# Patient Record
Sex: Male | Born: 1955 | Race: Black or African American | Hispanic: No | Marital: Married | Smoking: Never smoker
Health system: Southern US, Community
[De-identification: ages and names within clinical notes are randomized; demographics above are authoritative.]

## PROBLEM LIST (undated history)

## (undated) DIAGNOSIS — E119 Type 2 diabetes mellitus without complications: Secondary | ICD-10-CM

## (undated) DIAGNOSIS — I10 Essential (primary) hypertension: Secondary | ICD-10-CM

## (undated) HISTORY — DX: Essential (primary) hypertension: I10

## (undated) HISTORY — DX: Type 2 diabetes mellitus without complications: E11.9

## (undated) HISTORY — PX: FRACTURE SURGERY: SHX138

---

## 1978-03-02 HISTORY — PX: OTHER SURGICAL HISTORY: SHX169

## 2002-03-12 ENCOUNTER — Emergency Department (HOSPITAL_COMMUNITY): Admission: EM | Admit: 2002-03-12 | Discharge: 2002-03-12 | Payer: Self-pay | Admitting: Emergency Medicine

## 2008-11-28 ENCOUNTER — Observation Stay (HOSPITAL_COMMUNITY): Admission: EM | Admit: 2008-11-28 | Discharge: 2008-11-29 | Payer: Self-pay | Admitting: Emergency Medicine

## 2008-11-28 ENCOUNTER — Ambulatory Visit: Payer: Self-pay | Admitting: Cardiology

## 2008-11-28 ENCOUNTER — Encounter (INDEPENDENT_AMBULATORY_CARE_PROVIDER_SITE_OTHER): Payer: Self-pay | Admitting: *Deleted

## 2008-12-10 ENCOUNTER — Ambulatory Visit: Payer: Self-pay | Admitting: Gastroenterology

## 2008-12-10 DIAGNOSIS — R1310 Dysphagia, unspecified: Secondary | ICD-10-CM | POA: Insufficient documentation

## 2008-12-10 DIAGNOSIS — K5909 Other constipation: Secondary | ICD-10-CM | POA: Insufficient documentation

## 2008-12-10 DIAGNOSIS — R634 Abnormal weight loss: Secondary | ICD-10-CM

## 2008-12-11 ENCOUNTER — Encounter: Payer: Self-pay | Admitting: Gastroenterology

## 2008-12-11 ENCOUNTER — Ambulatory Visit: Payer: Self-pay | Admitting: Gastroenterology

## 2008-12-12 ENCOUNTER — Telehealth: Payer: Self-pay | Admitting: Gastroenterology

## 2008-12-13 ENCOUNTER — Telehealth (INDEPENDENT_AMBULATORY_CARE_PROVIDER_SITE_OTHER): Payer: Self-pay | Admitting: *Deleted

## 2008-12-14 ENCOUNTER — Telehealth: Payer: Self-pay | Admitting: Gastroenterology

## 2008-12-14 ENCOUNTER — Encounter: Payer: Self-pay | Admitting: Gastroenterology

## 2008-12-17 ENCOUNTER — Ambulatory Visit: Payer: Self-pay | Admitting: Gastroenterology

## 2008-12-19 ENCOUNTER — Ambulatory Visit: Payer: Self-pay | Admitting: Gastroenterology

## 2008-12-19 ENCOUNTER — Telehealth (INDEPENDENT_AMBULATORY_CARE_PROVIDER_SITE_OTHER): Payer: Self-pay | Admitting: *Deleted

## 2008-12-20 DIAGNOSIS — R109 Unspecified abdominal pain: Secondary | ICD-10-CM

## 2008-12-20 DIAGNOSIS — F329 Major depressive disorder, single episode, unspecified: Secondary | ICD-10-CM

## 2008-12-21 ENCOUNTER — Telehealth: Payer: Self-pay | Admitting: Gastroenterology

## 2008-12-25 ENCOUNTER — Telehealth (INDEPENDENT_AMBULATORY_CARE_PROVIDER_SITE_OTHER): Payer: Self-pay | Admitting: *Deleted

## 2008-12-28 ENCOUNTER — Emergency Department (HOSPITAL_COMMUNITY): Admission: EM | Admit: 2008-12-28 | Discharge: 2008-12-29 | Payer: Self-pay | Admitting: Emergency Medicine

## 2010-03-23 ENCOUNTER — Encounter: Payer: Self-pay | Admitting: Gastroenterology

## 2010-06-05 LAB — CBC
Hemoglobin: 14.6 g/dL (ref 13.0–17.0)
RBC: 4.83 MIL/uL (ref 4.22–5.81)
WBC: 5 10*3/uL (ref 4.0–10.5)

## 2010-06-05 LAB — BASIC METABOLIC PANEL
Calcium: 9.1 mg/dL (ref 8.4–10.5)
GFR calc Af Amer: >60 mL/min (ref >60)
GFR calc non Af Amer: >60 mL/min (ref >60)
Sodium: 136 mEq/L (ref 135–145)

## 2010-06-05 LAB — RAPID URINE DRUG SCREEN, HOSP PERFORMED
Amphetamines: NOT DETECTED
Benzodiazepines: NOT DETECTED
Opiates: NOT DETECTED
Tetrahydrocannabinol: NOT DETECTED

## 2010-06-05 LAB — DIFFERENTIAL
Basophils Absolute: 0 10*3/uL (ref 0.0–0.1)
Lymphocytes Relative: 41 % (ref 12–46)
Monocytes Absolute: 0.4 10*3/uL (ref 0.1–1.0)
Monocytes Relative: 7 % (ref 3–12)
Neutro Abs: 2.5 10*3/uL (ref 1.7–7.7)

## 2010-06-06 LAB — CK TOTAL AND CKMB (NOT AT ARMC)
CK, MB: 1.8 ng/mL (ref 0.3–4.0)
Relative Index: 1 (ref 0.0–2.5)
Total CK: 177 U/L (ref 7–232)
Total CK: 177 U/L (ref 7–232)

## 2010-06-06 LAB — CBC
HCT: 44.2 % (ref 39.0–52.0)
Hemoglobin: 15.4 g/dL (ref 13.0–17.0)
MCHC: 34.9 g/dL (ref 30.0–36.0)
RDW: 12.6 % (ref 11.5–15.5)

## 2010-06-06 LAB — DIFFERENTIAL
Basophils Absolute: 0 10*3/uL (ref 0.0–0.1)
Basophils Relative: 1 % (ref 0–1)
Monocytes Relative: 7 % (ref 3–12)
Neutro Abs: 2.2 10*3/uL (ref 1.7–7.7)
Neutrophils Relative %: 45 % (ref 43–77)

## 2010-06-06 LAB — COMPREHENSIVE METABOLIC PANEL
Alkaline Phosphatase: 59 U/L (ref 39–117)
BUN: 6 mg/dL (ref 6–23)
GFR calc non Af Amer: >60 mL/min (ref >60)
Glucose, Bld: 95 mg/dL (ref 70–99)
Potassium: 4.2 mEq/L (ref 3.5–5.1)
Total Protein: 7.4 g/dL (ref 6.0–8.3)

## 2010-06-06 LAB — POCT CARDIAC MARKERS
CKMB, poc: 1.3 ng/mL (ref 1.0–8.0)
Myoglobin, poc: 73.7 ng/mL (ref 12–200)

## 2010-06-06 LAB — TROPONIN I: Troponin I: 0.01 ng/mL (ref 0.00–0.06)

## 2011-03-12 ENCOUNTER — Encounter (INDEPENDENT_AMBULATORY_CARE_PROVIDER_SITE_OTHER): Payer: BC Managed Care – PPO | Admitting: Family Medicine

## 2011-03-12 DIAGNOSIS — Z13220 Encounter for screening for lipoid disorders: Secondary | ICD-10-CM

## 2011-03-12 DIAGNOSIS — Z0000 Encounter for general adult medical examination without abnormal findings: Secondary | ICD-10-CM

## 2011-03-12 DIAGNOSIS — R0602 Shortness of breath: Secondary | ICD-10-CM

## 2011-03-12 DIAGNOSIS — R079 Chest pain, unspecified: Secondary | ICD-10-CM

## 2011-03-13 ENCOUNTER — Other Ambulatory Visit: Payer: Self-pay | Admitting: Family Medicine

## 2011-03-14 ENCOUNTER — Ambulatory Visit (INDEPENDENT_AMBULATORY_CARE_PROVIDER_SITE_OTHER): Payer: BC Managed Care – PPO

## 2011-03-14 DIAGNOSIS — E119 Type 2 diabetes mellitus without complications: Secondary | ICD-10-CM

## 2011-03-17 ENCOUNTER — Other Ambulatory Visit: Payer: Self-pay

## 2011-03-18 ENCOUNTER — Ambulatory Visit (INDEPENDENT_AMBULATORY_CARE_PROVIDER_SITE_OTHER): Payer: BC Managed Care – PPO

## 2011-03-18 DIAGNOSIS — E119 Type 2 diabetes mellitus without complications: Secondary | ICD-10-CM

## 2011-05-09 IMAGING — CR DG ABDOMEN 1V
1 series · 1 of 1 positions shown · non-contrast
Comparison: Chest radiograph from the same day.

CLINICAL DATA: 52-year-old male with chest pain and no bowel
movement for 1 month.

ABDOMEN - 1 VIEW

[t abdomen supine]
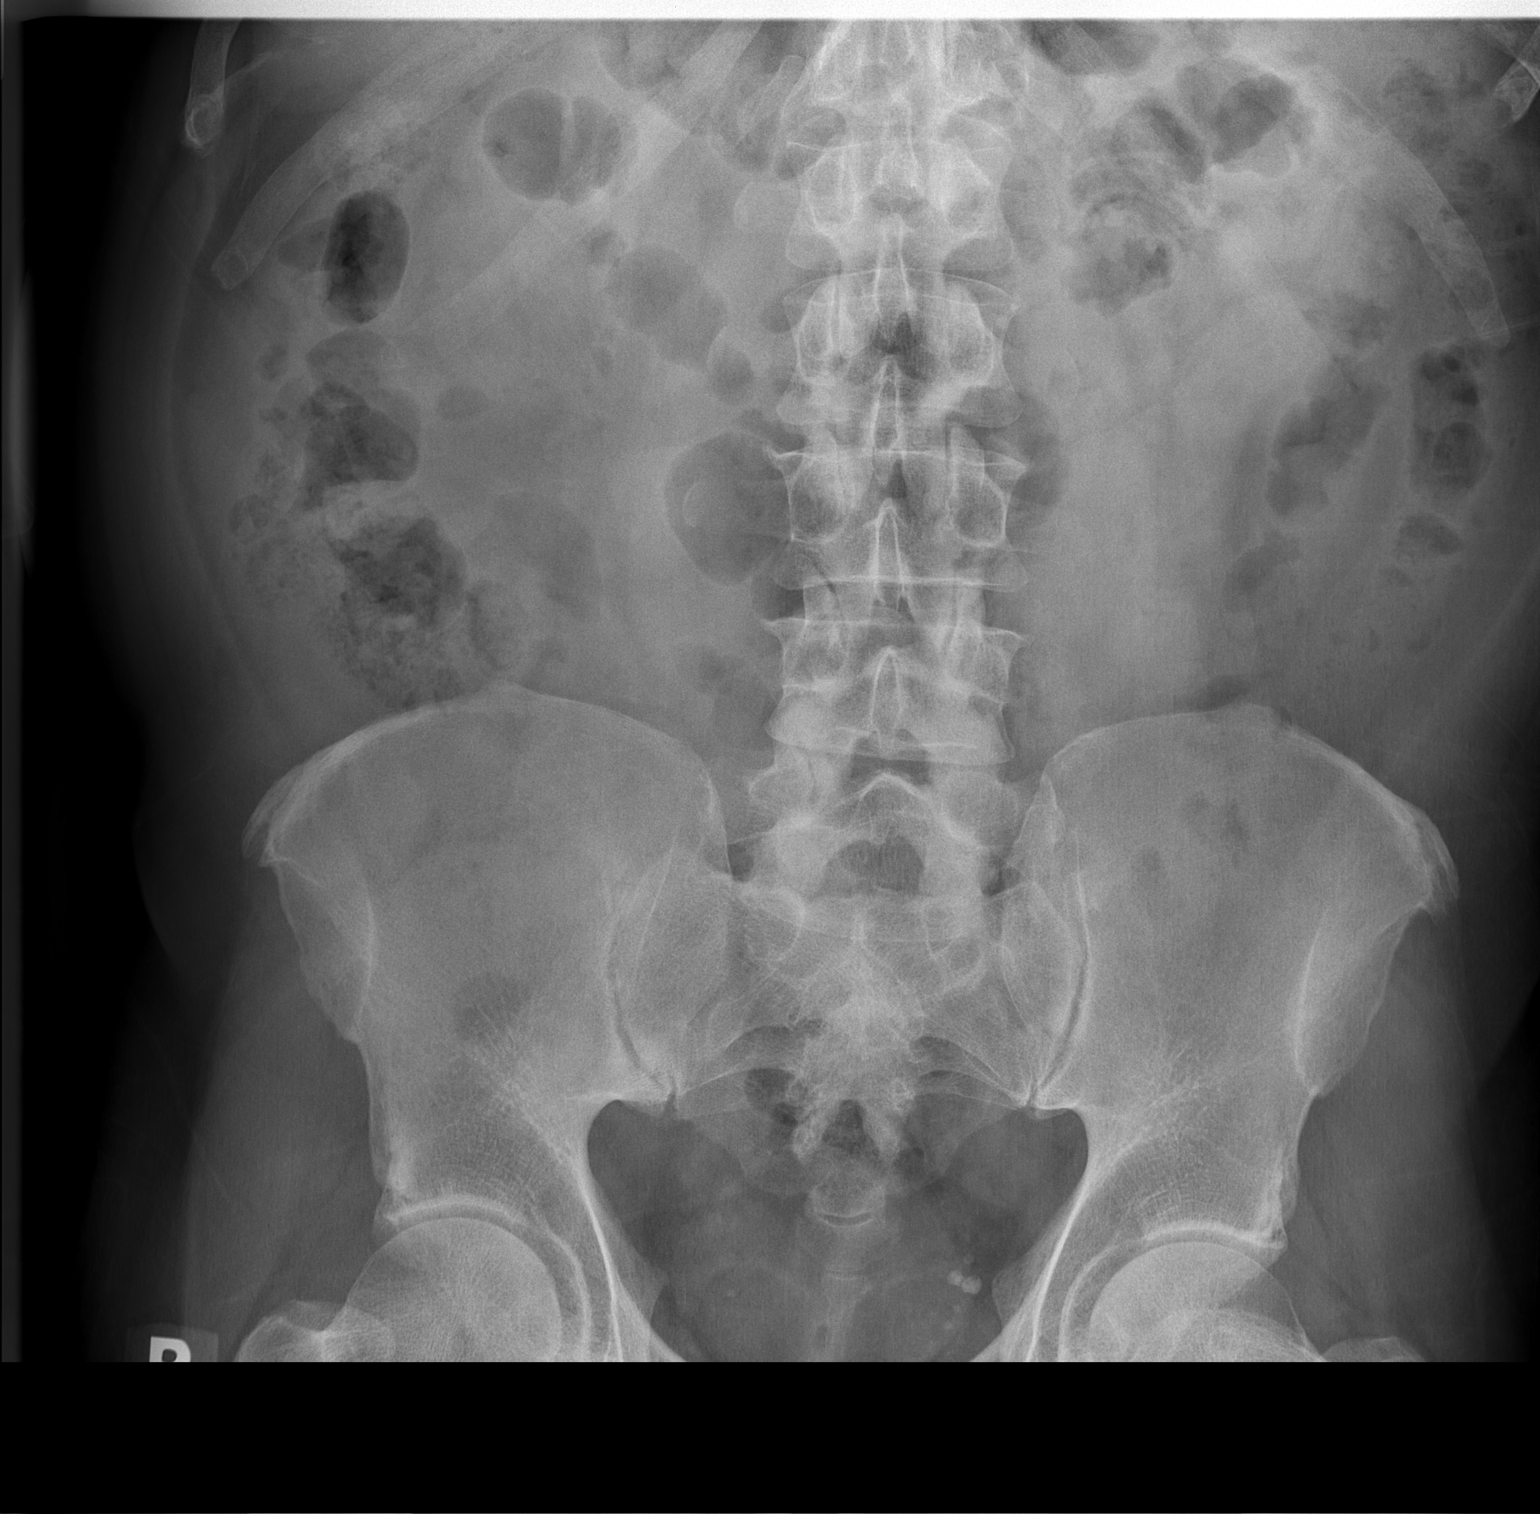

[1 of 1 positions shown; findings below may reference images not displayed]

FINDINGS: Nonobstructed bowel gas pattern. No significant volume of
retained stool identified.  Pelvic phleboliths.  Upper abdomen not
included on this view. No acute osseous abnormality identified.
Visualized visceral contours within normal limits.
IMPRESSION: Nonobstructed bowel gas pattern.

## 2011-07-09 ENCOUNTER — Ambulatory Visit (INDEPENDENT_AMBULATORY_CARE_PROVIDER_SITE_OTHER): Payer: BC Managed Care – PPO | Admitting: Family Medicine

## 2011-07-09 VITALS — BP 142/88 | HR 62 | Temp 98.1°F | Resp 16 | Ht 71.0 in | Wt 258.0 lb

## 2011-07-09 DIAGNOSIS — L0291 Cutaneous abscess, unspecified: Secondary | ICD-10-CM

## 2011-07-09 DIAGNOSIS — L0390 Cellulitis, unspecified: Secondary | ICD-10-CM

## 2011-07-09 MED ORDER — DOXYCYCLINE HYCLATE 100 MG PO TABS: 100.0000 mg | ORAL_TABLET | Freq: Two times a day (BID) | ORAL | Status: AC

## 2011-07-09 NOTE — Progress Notes (Signed)
  Subjective:    Patient ID: Tyler Burnett, male    DOB: 11/14/1955, 56 y.o.   MRN: [id number]  HPI 56 yo male with diabetes here with leg pain.  Left knee.  Had what he thought was just a hair bump on medial side of left knee.  Popped it Sunday (5 days ago).  Since that time has gotten red, sore, and pain starting to spread up inner thigh.  No fever.  Feels well.  No history of abscesses previously.    Review of Systems Negative except as per HPI     Objective:   Physical Exam  Constitutional: He appears well-developed.  Pulmonary/Chest: Effort normal.  Neurological: He is alert.  Skin:       Left knee, medial side - punctate area with expressible purulence - culture obtained.  Not fluctuant.  Aprox 1-1.5 cm diameter of erythem.  No inguinal lymphadenopathy.           Assessment & Plan:  Early abscess/cellulitis - start doxy.  Await culture.  Since small, return if doesn't improve or worsen.  Does not need 48 hour recheck unless not improving.

## 2011-07-13 LAB — WOUND CULTURE

## 2011-10-23 ENCOUNTER — Ambulatory Visit: Payer: BC Managed Care – PPO | Admitting: Family Medicine

## 2011-10-30 ENCOUNTER — Ambulatory Visit: Payer: BC Managed Care – PPO

## 2011-10-30 ENCOUNTER — Encounter: Payer: Self-pay | Admitting: Family Medicine

## 2011-10-30 ENCOUNTER — Ambulatory Visit (INDEPENDENT_AMBULATORY_CARE_PROVIDER_SITE_OTHER): Payer: BC Managed Care – PPO | Admitting: Family Medicine

## 2011-10-30 VITALS — BP 121/75 | HR 66 | Temp 98.0°F | Resp 18 | Ht 71.0 in | Wt 255.4 lb

## 2011-10-30 DIAGNOSIS — M25529 Pain in unspecified elbow: Secondary | ICD-10-CM

## 2011-10-30 DIAGNOSIS — M7022 Olecranon bursitis, left elbow: Secondary | ICD-10-CM

## 2011-10-30 DIAGNOSIS — M25522 Pain in left elbow: Secondary | ICD-10-CM

## 2011-10-30 NOTE — Progress Notes (Signed)
  Subjective:    Patient ID: Tyler Burnett, male    DOB: [birth year], 56 y.o.   MRN: [id number]  HPI Tyler Burnett is a 56 y.o. male Complains of L elbow pain and swelling for past 1 month - initially more swollen then today, improved over past week.  No known intial injury.  Did bump this few days ago - sore, but able to move ok.  No prior similar sx's/.   Tx: alcohol on cotton ball.  No wraps.   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  R hand dominant.   Review of Systems No fever, redness or discharge of area.     Objective:   Physical Exam  Constitutional: He is oriented to person, place, and time. He appears well-developed and well-nourished.  HENT:  Head: Normocephalic and atraumatic.  Cardiovascular: Normal rate, normal heart sounds and intact distal pulses.   Pulmonary/Chest: Effort normal.  Musculoskeletal:       Left elbow: He exhibits swelling and effusion. He exhibits no deformity. tenderness found. Olecranon process (minimal ttp over fluctuant bursa. no erythema. no wound visualized. ) tenderness noted. No radial head, no medial epicondyle and no lateral epicondyle tenderness noted.       Arms: Neurological: He is alert and oriented to person, place, and time.  Skin: Skin is warm and dry. No rash noted. No erythema.  Psychiatric: He has a normal mood and affect.   [facility] reading (PRIMARY) by  Dr. Neva Seat: L elbow. No acute fx identified. .    Assessment & Plan:  Tyler Burnett is a 56 y.o. male 1. Elbow pain, left  DG Elbow Complete Left  2. Olecranon bursitis of left elbow     L olecranon bursitis - likely d/t recurrent use with plumbing snake upon further hx. . Discussed option of aspiration now or ace bandage and decreased repetitive use as swelling already improving - he would like to try to hold off on aspiration.  H/o from sports medicine on olecranon bursitis.  Ace bandage applied, but can use off the shelf neoprene sleeve, ice prn, avoid offending activities, and  recheck in next 2 weeks if not continuing to improve, or sooner if any increased pain or redness/warmth.

## 2011-12-31 ENCOUNTER — Ambulatory Visit (INDEPENDENT_AMBULATORY_CARE_PROVIDER_SITE_OTHER): Payer: BC Managed Care – PPO | Admitting: Family Medicine

## 2011-12-31 VITALS — BP 138/91 | HR 60 | Temp 98.7°F | Resp 18 | Ht 71.5 in | Wt 251.0 lb

## 2011-12-31 DIAGNOSIS — J40 Bronchitis, not specified as acute or chronic: Secondary | ICD-10-CM

## 2011-12-31 MED ORDER — BENZONATATE 200 MG PO CAPS: 200.0000 mg | ORAL_CAPSULE | Freq: Three times a day (TID) | ORAL | Status: DC | PRN

## 2011-12-31 MED ORDER — PROMETHAZINE-CODEINE 6.25-10 MG/5ML PO SYRP: 5.0000 mL | ORAL_SOLUTION | ORAL | Status: DC | PRN

## 2011-12-31 NOTE — Progress Notes (Signed)
  Subjective:    Patient ID: Tyler Burnett, male    DOB: 07/13/1955, 56 y.o.   MRN: [id number]  HPI GOt URI about 7-10d ago and now turning into bad cough, will take breath away, dry heave or cough to the point of regurg, sometimes coughing up white liquid.  Has had to pull over he is coughing so hard. No f/c.  No h/o tob use, no pulm hx.    No past medical history on file.  Review of Systems  Constitutional: Positive for diaphoresis, activity change, appetite change and fatigue. Negative for fever, chills and unexpected weight change.  HENT: Positive for congestion, sore throat and sinus pressure. Negative for ear pain and neck stiffness.   Eyes: Negative for discharge and redness.  Respiratory: Positive for cough and shortness of breath. Negative for chest tightness and wheezing.   Cardiovascular: Negative for chest pain.  Gastrointestinal: Positive for nausea and vomiting. Negative for abdominal pain, diarrhea and constipation.  Genitourinary: Negative for dysuria.  Musculoskeletal: Negative for myalgias, arthralgias and gait problem.  Skin: Negative for rash.  Neurological: Negative for syncope and headaches.  Hematological: Negative for adenopathy.  Psychiatric/Behavioral: Positive for disturbed wake/sleep cycle.     BP 138/91  Pulse 60  Temp 98.7 F (37.1 C) (Oral)  Resp 18  Ht 5' 11.5" (1.816 m)  Wt 251 lb (113.853 kg)  BMI 34.52 kg/m2  SpO2 96% Objective:   Physical Exam  Constitutional: He is oriented to person, place, and time. He appears well-developed and well-nourished. No distress.  HENT:  Head: Normocephalic and atraumatic.  Right Ear: Tympanic membrane, external ear and ear canal normal.  Left Ear: Tympanic membrane, external ear and ear canal normal.  Nose: Nose normal.  Mouth/Throat: Oropharynx is clear and moist and mucous membranes are normal. No oropharyngeal exudate.  Eyes: Conjunctivae normal are normal. No scleral icterus.  Neck: Normal range of motion.  Neck supple. No thyromegaly present.  Cardiovascular: Normal rate, regular rhythm and S2 normal.   Murmur heard.  Decrescendo systolic murmur is present with a grade of 2/6  Pulmonary/Chest: Effort normal and breath sounds normal. No respiratory distress.  Abdominal: Soft. Bowel sounds are normal. He exhibits no distension and no mass. There is no tenderness. There is no rebound and no guarding.  Musculoskeletal: He exhibits no edema.  Lymphadenopathy:    He has no cervical adenopathy.  Neurological: He is alert and oriented to person, place, and time.  Skin: Skin is warm and dry. He is not diaphoretic. No erythema.  Psychiatric: He has a normal mood and affect. His behavior is normal.      Assessment & Plan:   1. Bronchitis - suspect viral - treat symptomatically with cough meds - tessalon while at work, promethazine-codeine for evening.

## 2011-12-31 NOTE — Patient Instructions (Addendum)

## 2012-03-05 ENCOUNTER — Ambulatory Visit (INDEPENDENT_AMBULATORY_CARE_PROVIDER_SITE_OTHER): Payer: BC Managed Care – PPO | Admitting: Family Medicine

## 2012-03-05 VITALS — BP 136/82 | HR 70 | Temp 98.2°F | Resp 20 | Ht 71.5 in | Wt 247.4 lb

## 2012-03-05 DIAGNOSIS — E119 Type 2 diabetes mellitus without complications: Secondary | ICD-10-CM

## 2012-03-05 DIAGNOSIS — R05 Cough: Secondary | ICD-10-CM

## 2012-03-05 DIAGNOSIS — J209 Acute bronchitis, unspecified: Secondary | ICD-10-CM

## 2012-03-05 DIAGNOSIS — R6889 Other general symptoms and signs: Secondary | ICD-10-CM

## 2012-03-05 DIAGNOSIS — R059 Cough, unspecified: Secondary | ICD-10-CM

## 2012-03-05 DIAGNOSIS — J111 Influenza due to unidentified influenza virus with other respiratory manifestations: Secondary | ICD-10-CM

## 2012-03-05 DIAGNOSIS — I10 Essential (primary) hypertension: Secondary | ICD-10-CM

## 2012-03-05 LAB — COMPREHENSIVE METABOLIC PANEL
ALT: 43 U/L (ref 0–53)
Albumin: 4.1 g/dL (ref 3.5–5.2)
CO2: 28 mEq/L (ref 19–32)
Glucose, Bld: 112 mg/dL — ABNORMAL HIGH (ref 70–99)
Potassium: 4.2 mEq/L (ref 3.5–5.3)
Sodium: 138 mEq/L (ref 135–145)
Total Protein: 7.2 g/dL (ref 6.0–8.3)

## 2012-03-05 LAB — POCT CBC
HCT, POC: 52.9 % (ref 43.5–53.7)
Hemoglobin: 17.1 g/dL (ref 14.1–18.1)
Lymph, poc: 2.2 (ref 0.6–3.4)
MCH, POC: 29.4 pg (ref 27–31.2)
MCHC: 32.3 g/dL (ref 31.8–35.4)
MPV: 7.6 fL (ref 0–99.8)
POC Granulocyte: 1.2 — AB (ref 2–6.9)
RBC: 5.81 M/uL (ref 4.69–6.13)
WBC: 3.8 10*3/uL — AB (ref 4.6–10.2)

## 2012-03-05 LAB — POCT GLYCOSYLATED HEMOGLOBIN (HGB A1C): Hemoglobin A1C: 7.1

## 2012-03-05 LAB — GLUCOSE, POCT (MANUAL RESULT ENTRY): POC Glucose: 115 mg/dl — AB (ref 70–99)

## 2012-03-05 LAB — COMPREHENSIVE METABOLIC PANEL WITH GFR
AST: 41 U/L — ABNORMAL HIGH (ref 0–37)
Alkaline Phosphatase: 54 U/L (ref 39–117)
BUN: 9 mg/dL (ref 6–23)
Calcium: 9.1 mg/dL (ref 8.4–10.5)
Chloride: 100 meq/L (ref 96–112)
Creat: 0.95 mg/dL (ref 0.50–1.35)
Total Bilirubin: 0.7 mg/dL (ref 0.3–1.2)

## 2012-03-05 MED ORDER — HYDROCODONE-HOMATROPINE 5-1.5 MG/5ML PO SYRP: 5.0000 mL | ORAL_SOLUTION | Freq: Three times a day (TID) | ORAL | Status: DC | PRN

## 2012-03-05 MED ORDER — BENZONATATE 200 MG PO CAPS: 200.0000 mg | ORAL_CAPSULE | Freq: Three times a day (TID) | ORAL | Status: DC | PRN

## 2012-03-05 MED ORDER — AZITHROMYCIN 250 MG PO TABS: ORAL_TABLET | ORAL | Status: DC

## 2012-03-05 MED ORDER — METFORMIN HCL 500 MG PO TABS: 500.0000 mg | ORAL_TABLET | Freq: Every day | ORAL | Status: DC

## 2012-03-05 NOTE — Progress Notes (Signed)
[facility]:  Office Visit  Chief Complaint:  Chief Complaint  Patient presents with  . Cough    wheezing  x 5 days  . Chest Pain  . Chills    HPI: Tyler Burnett is a 57 y.o. male who complains of   1. Cough with sinus and chest congestion x 5 days. Associated with chills. Did not measure temperature.  White to clear sputum with cough. Denies ear pain, sinus pain, nausea, vomiting, abd pain. He was exposed to flu contact. Took Nyquil, Vicks without relief. Did not get flu vaccine this year. 2. H/o  diabetes. Has not taken any metformin in 1 month, ran out of meds. Deneis neuropathy, not UTD on eye exam.   Past Medical History  Diagnosis Date  . Hypertension    Past Surgical History  Procedure Date  . Fracture surgery    History   Social History  . Marital Status: Married    Spouse Name: N/A    Number of Children: N/A  . Years of Education: N/A   Social History Main Topics  . Smoking status: Never Smoker   . Smokeless tobacco: None  . Alcohol Use: No     Comment: occasion 2 drinks per year  . Drug Use: None  . Sexually Active: Yes -- Male partner(s)   Other Topics Concern  . None   Social History Narrative  . None   Family History  Problem Relation Age of Onset  . Aneurysm Mother   . Heart disease Father    No Known Allergies Prior to Admission medications   Medication Sig Start Date End Date Taking? Authorizing Provider  lisinopril (PRINIVIL,ZESTRIL) 20 MG tablet Take 20 mg by mouth daily.   Yes Historical Provider, MD  benzonatate (TESSALON) 200 MG capsule Take 1 capsule (200 mg total) by mouth 3 (three) times daily as needed for cough. 12/31/11   Sherren Mocha, MD  lisinopril (PRINIVIL,ZESTRIL) 5 MG tablet Take 5 mg by mouth daily.    Historical Provider, MD  metFORMIN (GLUCOPHAGE) 500 MG tablet Take 500 mg by mouth daily.    Historical Provider, MD  promethazine-codeine (PHENERGAN WITH CODEINE) 6.25-10 MG/5ML syrup Take 5 mLs by mouth  every 4 (four) hours as needed for cough. 12/31/11   Sherren Mocha, MD     ROS: The patient denies night sweats, unintentional weight loss, palpitations, wheezing, dyspnea on exertion, nausea, vomiting, abdominal pain, dysuria, hematuria, melena, numbness, weakness, or tingling.   All other systems have been reviewed and were otherwise negative with the exception of those mentioned in the HPI and as above.    PHYSICAL EXAM: Filed Vitals:   03/05/12 1418  BP: 136/82  Pulse: 70  Temp: 98.2 F (36.8 C)  Resp: 20   Filed Vitals:   03/05/12 1418  Height: 5' 11.5" (1.816 m)  Weight: 247 lb 6.4 oz (112.22 kg)   Body mass index is 34.02 kg/(m^2).  General: Alert, no acute distress HEENT:  Normocephalic, atraumatic, oropharynx patent. No exudates, TM nl, EOMI, PERRLA, fundscopic exam nl Cardiovascular:  Regular rate and rhythm, no rubs murmurs or gallops.  No Carotid bruits, radial pulse intact. No pedal edema.  Respiratory: Clear to auscultation bilaterally.  No wheezes, rales, or rhonchi.  No cyanosis, no use of accessory musculature GI: No organomegaly, abdomen is soft and non-tender, positive bowel sounds.  No masses. Skin: No rashes. Neurologic: Facial musculature symmetric. Psychiatric: Patient is appropriate throughout our interaction. Lymphatic: No cervical lymphadenopathy  Musculoskeletal: Gait intact.   LABS: Results for orders placed in visit on 03/05/12  POCT GLYCOSYLATED HEMOGLOBIN (HGB A1C)      Component Value Range   Hemoglobin A1C 7.1    COMPREHENSIVE METABOLIC PANEL      Component Value Range   Sodium 138  135 - 145 mEq/L   Potassium 4.2  3.5 - 5.3 mEq/L   Chloride 100  96 - 112 mEq/L   CO2 28  19 - 32 mEq/L   Glucose, Bld 112 (*) 70 - 99 mg/dL   BUN 9  6 - 23 mg/dL   Creat 1.61  0.96 - 0.45 mg/dL   Total Bilirubin 0.7  0.3 - 1.2 mg/dL   Alkaline Phosphatase 54  39 - 117 U/L   AST 41 (*) 0 - 37 U/L   ALT 43  0 - 53 U/L   Total Protein 7.2  6.0 - 8.3 g/dL    Albumin 4.1  3.5 - 5.2 g/dL   Calcium 9.1  8.4 - 40.9 mg/dL  POCT CBC      Component Value Range   WBC 3.8 (*) 4.6 - 10.2 K/uL   Lymph, poc 2.2  0.6 - 3.4   POC LYMPH PERCENT 59.0 (*) 10 - 50 %L   MID (cbc) 0.3  0 - 0.9   POC MID % 8.9  0 - 12 %M   POC Granulocyte 1.2 (*) 2 - 6.9   Granulocyte percent 32.1 (*) 37 - 80 %G   RBC 5.81  4.69 - 6.13 M/uL   Hemoglobin 17.1  14.1 - 18.1 g/dL   HCT, POC 81.1  91.4 - 53.7 %   MCV 91.0  80 - 97 fL   MCH, POC 29.4  27 - 31.2 pg   MCHC 32.3  31.8 - 35.4 g/dL   RDW, POC 78.2     Platelet Count, POC 258  142 - 424 K/uL   MPV 7.6  0 - 99.8 fL  GLUCOSE, POCT (MANUAL RESULT ENTRY)      Component Value Range   POC Glucose 115 (*) 70 - 99 mg/dl     EKG/XRAY:   Primary read interpreted by Dr. Conley Rolls at [facility].   ASSESSMENT/PLAN: Encounter Diagnoses  Name Primary?  . Cough Yes  . HTN (hypertension)   . Diabetes   . Acute bronchitis   . Flu-like symptoms    Refilled diabetes meds Rx Azithromycin, tessalon Perles, Hydromet Patient has had sxs for over 5 days , he is outside window for flu treatment so will not test. I will try to cover him for possible secondary bacterial infection ie bronchitis since he is a noncompliant diabetic F/u in 3 months. Take medications as directed.     Shamir Tuzzolino PHUONG, DO 03/08/2012 2:00 AM

## 2012-05-24 ENCOUNTER — Encounter: Payer: Self-pay | Admitting: Family Medicine

## 2012-06-16 ENCOUNTER — Encounter: Payer: BC Managed Care – PPO | Admitting: Family Medicine

## 2012-08-01 ENCOUNTER — Other Ambulatory Visit: Payer: Self-pay

## 2012-10-10 ENCOUNTER — Other Ambulatory Visit: Payer: Self-pay | Admitting: *Deleted

## 2012-10-14 ENCOUNTER — Other Ambulatory Visit: Payer: Self-pay | Admitting: Family Medicine

## 2012-10-14 ENCOUNTER — Other Ambulatory Visit: Payer: Self-pay

## 2012-10-24 ENCOUNTER — Ambulatory Visit (INDEPENDENT_AMBULATORY_CARE_PROVIDER_SITE_OTHER): Payer: BC Managed Care – PPO | Admitting: Family Medicine

## 2012-10-24 ENCOUNTER — Encounter: Payer: Self-pay | Admitting: Family Medicine

## 2012-10-24 VITALS — BP 140/80 | HR 63 | Temp 98.1°F | Resp 16 | Ht 71.0 in | Wt 255.0 lb

## 2012-10-24 DIAGNOSIS — E119 Type 2 diabetes mellitus without complications: Secondary | ICD-10-CM

## 2012-10-24 DIAGNOSIS — K429 Umbilical hernia without obstruction or gangrene: Secondary | ICD-10-CM

## 2012-10-24 DIAGNOSIS — I10 Essential (primary) hypertension: Secondary | ICD-10-CM

## 2012-10-24 DIAGNOSIS — B009 Herpesviral infection, unspecified: Secondary | ICD-10-CM

## 2012-10-24 LAB — BASIC METABOLIC PANEL
BUN: 11 mg/dL (ref 6–23)
Calcium: 9.7 mg/dL (ref 8.4–10.5)
Creat: 0.91 mg/dL (ref 0.50–1.35)
Glucose, Bld: 104 mg/dL — ABNORMAL HIGH (ref 70–99)
Potassium: 4.5 mEq/L (ref 3.5–5.3)

## 2012-10-24 MED ORDER — LISINOPRIL 20 MG PO TABS: ORAL_TABLET | ORAL | Status: DC

## 2012-10-24 MED ORDER — VALACYCLOVIR HCL 500 MG PO TABS: 500.0000 mg | ORAL_TABLET | Freq: Every day | ORAL | Status: DC

## 2012-10-24 MED ORDER — METFORMIN HCL 500 MG PO TABS: 500.0000 mg | ORAL_TABLET | Freq: Every day | ORAL | Status: DC

## 2012-10-24 NOTE — Patient Instructions (Addendum)
Continue to watch diet, work on weight loss. Recheck for physical in the next 3-6 months. You should receive a call or letter about your lab results within the next week to 10 days.  Return to the clinic or go to the nearest emergency room if any of your symptoms worsen or new symptoms occur.

## 2012-10-24 NOTE — Progress Notes (Signed)
Subjective:    Patient ID: Tyler Burnett, male    DOB: 1955-12-06, 57 y.o.   MRN: [id number]  HPI  Tyler Burnett is a 57 y.o. male Here for med refills.  No regular primary provider - just [facility].  Not fasting today.   Dm2 - home blood sugars unknown.  Wife keeps readings. Takes metformin 500mg  qd -rare missed dose.  Occasional bloating with constipation only - rarely.  Last A1c in January - Aic 7.1.  Results for orders placed in visit on 03/05/12  COMPREHENSIVE METABOLIC PANEL      Result Value Range   Sodium 138  135 - 145 mEq/L   Potassium 4.2  3.5 - 5.3 mEq/L   Chloride 100  96 - 112 mEq/L   CO2 28  19 - 32 mEq/L   Glucose, Bld 112 (*) 70 - 99 mg/dL   BUN 9  6 - 23 mg/dL   Creat 6.29  5.28 - 4.13 mg/dL   Total Bilirubin 0.7  0.3 - 1.2 mg/dL   Alkaline Phosphatase 54  39 - 117 U/L   AST 41 (*) 0 - 37 U/L   ALT 43  0 - 53 U/L   Total Protein 7.2  6.0 - 8.3 g/dL   Albumin 4.1  3.5 - 5.2 g/dL   Calcium 9.1  8.4 - 24.4 mg/dL  POCT GLYCOSYLATED HEMOGLOBIN (HGB A1C)      Result Value Range   Hemoglobin A1C 7.1    POCT CBC      Result Value Range   WBC 3.8 (*) 4.6 - 10.2 K/uL   Lymph, poc 2.2  0.6 - 3.4   POC LYMPH PERCENT 59.0 (*) 10 - 50 %L   MID (cbc) 0.3  0 - 0.9   POC MID % 8.9  0 - 12 %M   POC Granulocyte 1.2 (*) 2 - 6.9   Granulocyte percent 32.1 (*) 37 - 80 %G   RBC 5.81  4.69 - 6.13 M/uL   Hemoglobin 17.1  14.1 - 18.1 g/dL   HCT, POC 01.0  27.2 - 53.7 %   MCV 91.0  80 - 97 fL   MCH, POC 29.4  27 - 31.2 pg   MCHC 32.3  31.8 - 35.4 g/dL   RDW, POC 53.6     Platelet Count, POC 258  142 - 424 K/uL   MPV 7.6  0 - 99.8 fL  GLUCOSE, POCT (MANUAL RESULT ENTRY)      Result Value Range   POC Glucose 115 (*) 70 - 99 mg/dl   HTN - no new side effects of meds. No chest pain, no lightheadedness, no cough.   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  Takes valtrex 500mg  qd.   Review of Systems  Constitutional: Negative for fatigue and unexpected weight change.   Eyes: Negative for visual disturbance.  Respiratory: Negative for cough, chest tightness and shortness of breath.   Cardiovascular: Negative for chest pain, palpitations and leg swelling.  Gastrointestinal: Negative for abdominal pain and blood in stool.  Neurological: Negative for dizziness, light-headedness and headaches.       Objective:   Physical Exam  Vitals reviewed. Constitutional: He is oriented to person, place, and time. He appears well-developed and well-nourished.  HENT:  Head: Normocephalic and atraumatic.  Eyes: Pupils are equal, round, and reactive to light.  Cardiovascular: Normal rate, regular rhythm, normal heart sounds and intact distal pulses.   Pulmonary/Chest: Effort normal and breath  sounds normal.  Abdominal: Soft. There is no tenderness. A hernia (umbilical, reducible, nontender. ) is present.  Neurological: He is alert and oriented to person, place, and time.  Microfilament testing of feet normal bilaterally.  Skin: Skin is warm, dry and intact. No rash noted.  Psychiatric: He has a normal mood and affect. His behavior is normal.   HGB A1c 6.6    Assessment & Plan:  Tyler Burnett is a 57 y.o. male  Umbilical hernia - Plan: Ambulatory referral to General Surgery  Diabetes - Plan: POCT glucose (manual entry), POCT glycosylated hemoglobin (Hb A1C), Basic metabolic panel, metFORMIN (GLUCOPHAGE) 500 MG tablet, lisinopril (PRINIVIL,ZESTRIL) 20 MG tablet  Unspecified essential hypertension - Plan: Basic metabolic panel, lisinopril (PRINIVIL,ZESTRIL) 20 MG tablet  HSV-2 infection - Plan: valACYclovir (VALTREX) 500 MG tablet    Umbilical hernia - in discussion there for a few years, no current pain.  Would like to discuss treatment options - refer to general surgeon. ER/hernia precautions discussed.   DM2 - controlled with A1c 6.6. Discussed continued work on diet and exercise and could be diet control if these were to improve.   HTN - stable. Check BMP.  Cont same doses meds.   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   Plan on follow up next 3-6 months.   Meds ordered this encounter  Medications  . DISCONTD: valACYclovir (VALTREX) 500 MG tablet    Sig: Take 500 mg by mouth daily.  . metFORMIN (GLUCOPHAGE) 500 MG tablet    Sig: Take 1 tablet (500 mg total) by mouth daily.    Dispense:  90 tablet    Refill:  1  . valACYclovir (VALTREX) 500 MG tablet    Sig: Take 1 tablet (500 mg total) by mouth daily.    Dispense:  90 tablet    Refill:  3  . lisinopril (PRINIVIL,ZESTRIL) 20 MG tablet    Sig: TAKE 1 TABLET BY MOUTH DAILY    Dispense:  90 tablet    Refill:  1   Patient Instructions  Continue to watch diet, work on weight loss. Recheck for physical in the next 3-6 months. You should receive a call or letter about your lab results within the next week to 10 days.  Return to the clinic or go to the nearest emergency room if any of your symptoms worsen or new symptoms occur.

## 2012-10-25 NOTE — Progress Notes (Signed)
CPE appt made with pt for 01/30/13.

## 2012-11-04 ENCOUNTER — Ambulatory Visit (INDEPENDENT_AMBULATORY_CARE_PROVIDER_SITE_OTHER): Payer: BC Managed Care – PPO | Admitting: Surgery

## 2012-11-11 ENCOUNTER — Telehealth (INDEPENDENT_AMBULATORY_CARE_PROVIDER_SITE_OTHER): Payer: Self-pay | Admitting: Surgery

## 2012-11-11 ENCOUNTER — Encounter (INDEPENDENT_AMBULATORY_CARE_PROVIDER_SITE_OTHER): Payer: Self-pay | Admitting: Surgery

## 2012-11-11 ENCOUNTER — Ambulatory Visit (INDEPENDENT_AMBULATORY_CARE_PROVIDER_SITE_OTHER): Payer: BC Managed Care – PPO | Admitting: Surgery

## 2012-11-11 VITALS — BP 140/88 | HR 64 | Temp 97.2°F | Resp 14 | Ht 72.0 in | Wt 259.6 lb

## 2012-11-11 DIAGNOSIS — K429 Umbilical hernia without obstruction or gangrene: Secondary | ICD-10-CM

## 2012-11-11 NOTE — Progress Notes (Signed)
Patient ID: Tyler Burnett, male   DOB: 02-27-1956, 57 y.o.   MRN: [id number]  Chief Complaint  Patient presents with  . New Evaluation    eval umb hernia    HPI Tyler Burnett is a 57 y.o. male.  Patient sent at request of Dr. Chilton Si for umbilical hernia. It has been present for many months. It's getting larger. Made worse with exertion. Mild to moderate discomfort with exertion at the umbilicus. No nausea or vomiting. He has not smoked. HPI  Past Medical History  Diagnosis Date  . Hypertension   . Diabetes mellitus without complication     Past Surgical History  Procedure Laterality Date  . Fracture surgery    . Arm surgery  1980    had broke in 9 places     Family History  Problem Relation Age of Onset  . Aneurysm Mother   . Heart disease Father     Social History History  Substance Use Topics  . Smoking status: Never Smoker   . Smokeless tobacco: Never Used  . Alcohol Use: No     Comment: occasion 2 drinks per year    No Known Allergies  Current Outpatient Prescriptions  Medication Sig Dispense Refill  . lisinopril (PRINIVIL,ZESTRIL) 20 MG tablet TAKE 1 TABLET BY MOUTH DAILY  90 tablet  1  . metFORMIN (GLUCOPHAGE) 500 MG tablet Take 1 tablet (500 mg total) by mouth daily.  90 tablet  1  . valACYclovir (VALTREX) 500 MG tablet Take 1 tablet (500 mg total) by mouth daily.  90 tablet  3   No current facility-administered medications for this visit.    Review of Systems Review of Systems  Constitutional: Negative for fever, chills and unexpected weight change.  HENT: Negative for hearing loss, congestion, sore throat, trouble swallowing and voice change.   Eyes: Negative for visual disturbance.  Respiratory: Negative for cough and wheezing.   Cardiovascular: Negative for chest pain, palpitations and leg swelling.  Gastrointestinal: Negative for nausea, vomiting, abdominal pain, diarrhea, constipation, blood in stool, abdominal distention, anal bleeding and rectal  pain.  Genitourinary: Negative for hematuria and difficulty urinating.  Musculoskeletal: Negative for arthralgias.  Skin: Negative for rash and wound.  Neurological: Negative for seizures, syncope, weakness and headaches.  Hematological: Negative for adenopathy. Does not bruise/bleed easily.  Psychiatric/Behavioral: Negative for confusion.    Blood pressure 140/88, pulse 64, temperature 97.2 F (36.2 C), temperature source Temporal, resp. rate 14, height 6' (1.829 m), weight 259 lb 9.6 oz (117.754 kg).  Physical Exam Physical Exam  Constitutional: He appears well-developed and well-nourished.  HENT:  Head: Normocephalic and atraumatic.  Eyes: EOM are normal. Pupils are equal, round, and reactive to light.  Neck: Normal range of motion. Neck supple.  Cardiovascular: Normal rate and regular rhythm.   Pulmonary/Chest: Effort normal and breath sounds normal.  Abdominal: Soft. Bowel sounds are normal. He exhibits no distension. There is no tenderness. A hernia is present.      Data Reviewed Dr Neva Seat notes  Assessment    Moderate size reducible umbilical hernia symptomatic    Plan    Recommend repair of umbilical hernia with mesh.The risk of hernia repair include bleeding,  Infection,   Recurrence of the hernia,  Mesh use, chronic pain,  Organ injury,  Bowel injury,  Bladder injury,   nerve injury with numbness around the incision,  Death,  and worsening of preexisting  medical problems.  The alternatives to surgery have been discussed as well..  Long  term expectations of both operative and non operative treatments have been discussed.   The patient agrees to proceed.       Asherah Lavoy A. 11/11/2012, 10:01 AM

## 2012-11-11 NOTE — Patient Instructions (Signed)
Hernia, Surgical Repair A hernia occurs when an internal organ pushes out through a weak spot in the belly (abdominal) wall muscles. Hernias commonly occur in the groin and around the navel. Hernias often can be pushed back into place (reduced). Most hernias tend to get worse over time. Problems occur when abdominal contents get stuck in the opening (incarcerated hernia). The blood supply gets cut off (strangulated hernia). This is an emergency and needs surgery. Otherwise, hernia repair can be an elective procedure. This means you can schedule this at your convenience when an emergency is not present. Because complications can occur, if you decide to repair the hernia, it is best to do it soon. When it becomes an emergency procedure, there is increased risk of complications after surgery. CAUSES   Heavy lifting.  Obesity.  Prolonged coughing.  Straining to move your bowels.  Hernias can also occur through a cut (incision) by a surgeonafter an abdominal operation. HOME CARE INSTRUCTIONS Before the repair:  Bed rest is not required. You may continue your normal activities, but avoid heavy lifting (more than 10 pounds) or straining. Cough gently. If you are a smoker, it is best to stop. Even the best hernia repair can break down with the continual strain of coughing.  Do not wear anything tight over your hernia. Do not try to keep it in with an outside bandage or truss. These can damage abdominal contents if they are trapped in the hernia sac.  Eat a normal diet. Avoid constipation. Straining over long periods of time to have a bowel movement will increase hernia size. It also can breakdown repairs. If you cannot do this with diet alone, laxatives or stool softeners may be used. PRIOR TO SURGERY, SEEK IMMEDIATE MEDICAL CARE IF: You have problems (symptoms) of a trapped (incarcerated) hernia. Symptoms include:  An oral temperature above 102 F (38.9 C) develops, or as your caregiver  suggests.  Increasing abdominal pain.  Feeling sick to your stomach(nausea) and vomiting.  You stop passing gas or stool.  The hernia is stuck outside the abdomen, looks discolored, feels hard, or is tender.  You have any changes in your bowel habits or in the hernia that is unusual for you. LET YOUR CAREGIVERS KNOW ABOUT THE FOLLOWING:  Allergies.  Medications taken including herbs, eye drops, over the counter medications, and creams.  Use of steroids (by mouth or creams).  Family or personal history of problems with anesthetics or Novocaine.  Possibility of pregnancy, if this applies.  Personal history of blood clots (thrombophlebitis).  Family or personal history of bleeding or blood problems.  Previous surgery.  Other health problems. BEFORE THE PROCEDURE You should be present 1 hour prior to your procedure, or as directed by your caregiver.  AFTER THE PROCEDURE After surgery, you will be taken to the recovery area. A nurse will watch and check your progress there. Once you are awake, stable, and taking fluids well, you will be allowed to go home as long as there are no problems. Once home, an ice pack (wrapped in a light towel) applied to your operative site may help with discomfort. It may also keep the swelling down. Do not lift anything heavier than 10 pounds (4.55 kilograms). Take showers not baths. Do not drive while taking narcotics. Follow instructions as suggested by your caregiver.  SEEK IMMEDIATE MEDICAL CARE IF: After surgery:  There is redness, swelling, or increasing pain in the wound.  There is pus coming from the wound.  There is  drainage from a wound lasting longer than 1 day.  An unexplained oral temperature above 102 F (38.9 C) develops.  You notice a foul smell coming from the wound or dressing.  There is a breaking open of a wound (edged not staying together) after the sutures have been removed.  You notice increasing pain in the shoulders  (shoulder strap areas).  You develop dizzy episodes or fainting while standing.  You develop persistent nausea or vomiting.  You develop a rash.  You have difficulty breathing.  You develop any reaction or side effects to medications given. MAKE SURE YOU:   Understand these instructions.  Will watch your condition.  Will get help right away if you are not doing well or get worse. Document Released: 08/12/2000 Document Revised: 05/11/2011 Document Reviewed: 07/05/2007 ExitCare Patient Information 2014 ExitCare,         OUT OF WORK FOR 4 WEEKS FOR ANY LIFTING GREATER THAN 10 LBS.  OK TO SUPERVISE AFTER 5 DAYS.

## 2012-11-11 NOTE — Telephone Encounter (Signed)
Went over pt financial responsibilities pt will call back to schedule. Placed in pending folder.

## 2013-01-30 ENCOUNTER — Encounter: Payer: BC Managed Care – PPO | Admitting: Family Medicine

## 2013-04-17 ENCOUNTER — Other Ambulatory Visit: Payer: Self-pay | Admitting: Family Medicine

## 2013-05-17 ENCOUNTER — Other Ambulatory Visit: Payer: Self-pay | Admitting: Physician Assistant

## 2013-05-29 ENCOUNTER — Encounter: Payer: BC Managed Care – PPO | Admitting: Physician Assistant

## 2013-05-29 NOTE — Progress Notes (Signed)
Subjective:       HPI    Review of Systems     Objective:   Physical Exam     Assessment:         Plan:           This encounter was created in error - please disregard. 

## 2013-06-05 ENCOUNTER — Encounter: Payer: BC Managed Care – PPO | Admitting: Physician Assistant

## 2013-06-05 NOTE — Progress Notes (Signed)
Subjective:       HPI    Review of Systems     Objective:   Physical Exam     Assessment:         Plan:           This encounter was created in error - please disregard. 

## 2013-07-18 ENCOUNTER — Other Ambulatory Visit: Payer: Self-pay | Admitting: *Deleted

## 2013-07-18 MED ORDER — LISINOPRIL 20 MG PO TABS
20.0000 mg | ORAL_TABLET | Freq: Every day | ORAL | Status: DC
Start: 2013-07-18 — End: 2013-09-05

## 2013-07-28 ENCOUNTER — Ambulatory Visit: Payer: BC Managed Care – PPO | Admitting: Family

## 2013-08-16 ENCOUNTER — Other Ambulatory Visit: Payer: Self-pay | Admitting: Family Medicine

## 2013-08-23 ENCOUNTER — Ambulatory Visit: Payer: BC Managed Care – PPO | Admitting: Family

## 2013-08-25 ENCOUNTER — Ambulatory Visit: Payer: BC Managed Care – PPO | Admitting: Family

## 2013-09-05 ENCOUNTER — Encounter: Payer: Self-pay | Admitting: Family

## 2013-09-05 ENCOUNTER — Encounter (INDEPENDENT_AMBULATORY_CARE_PROVIDER_SITE_OTHER): Payer: Self-pay

## 2013-09-05 ENCOUNTER — Ambulatory Visit (INDEPENDENT_AMBULATORY_CARE_PROVIDER_SITE_OTHER): Payer: BC Managed Care – PPO | Admitting: Family

## 2013-09-05 VITALS — BP 140/80 | HR 52 | Temp 99.0°F | Ht 72.0 in | Wt 259.0 lb

## 2013-09-05 DIAGNOSIS — Z0000 Encounter for general adult medical examination without abnormal findings: Secondary | ICD-10-CM

## 2013-09-05 DIAGNOSIS — Z23 Encounter for immunization: Secondary | ICD-10-CM

## 2013-09-05 DIAGNOSIS — E1165 Type 2 diabetes mellitus with hyperglycemia: Secondary | ICD-10-CM

## 2013-09-05 DIAGNOSIS — I10 Essential (primary) hypertension: Secondary | ICD-10-CM | POA: Insufficient documentation

## 2013-09-05 DIAGNOSIS — Z125 Encounter for screening for malignant neoplasm of prostate: Secondary | ICD-10-CM

## 2013-09-05 DIAGNOSIS — Z1321 Encounter for screening for nutritional disorder: Secondary | ICD-10-CM

## 2013-09-05 DIAGNOSIS — E119 Type 2 diabetes mellitus without complications: Secondary | ICD-10-CM

## 2013-09-05 LAB — POCT GLYCOSYLATED HEMOGLOBIN (HGB A1C): Hemoglobin A1C: 6.4

## 2013-09-05 MED ORDER — METFORMIN HCL 500 MG PO TABS: 500.0000 mg | ORAL_TABLET | Freq: Every day | ORAL | Status: DC

## 2013-09-05 MED ORDER — LISINOPRIL 40 MG PO TABS: 40.0000 mg | ORAL_TABLET | Freq: Every day | ORAL | Status: DC

## 2013-09-05 NOTE — Patient Instructions (Addendum)
Tetanus, Diphtheria, Pertussis (Tdap) Vaccine What You Need to Know WHY GET VACCINATED? Tetanus, diphtheria and pertussis can be very serious diseases, even for adolescents and adults. Tdap vaccine can protect us from these diseases. TETANUS (Lockjaw) causes painful muscle tightening and stiffness, usually all over the body.  It can lead to tightening of muscles in the head and neck so you can't open your mouth, swallow, or sometimes even breathe. Tetanus kills about 1 out of 5 people who are infected. DIPHTHERIA can cause a thick coating to form in the back of the throat.  It can lead to breathing problems, paralysis, heart failure, and death. PERTUSSIS (Whooping Cough) causes severe coughing spells, which can cause difficulty breathing, vomiting and disturbed sleep.  It can also lead to weight loss, incontinence, and rib fractures. Up to 2 in 100 adolescents and 5 in 100 adults with pertussis are hospitalized or have complications, which could include pneumonia and death. These diseases are caused by bacteria. Diphtheria and pertussis are spread from person to person through coughing or sneezing. Tetanus enters the body through cuts, scratches, or wounds. Before vaccines, the [place] saw as many as 200,000 cases a year of diphtheria and pertussis, and hundreds of cases of tetanus. Since vaccination began, tetanus and diphtheria have dropped by about 99% and pertussis by about 80%. TDAP VACCINE Tdap vaccine can protect adolescents and adults from tetanus, diphtheria, and pertussis. One dose of Tdap is routinely given at age 11 or 12. People who did not get Tdap at that age should get it as soon as possible. Tdap is especially important for health care professionals and anyone having close contact with a baby younger than 12 months. Pregnant women should get a dose of Tdap during every pregnancy, to protect the newborn from pertussis. Infants are most at risk for severe, life-threatening  complications from pertussis. A similar vaccine, called Td, protects from tetanus and diphtheria, but not pertussis. A Td booster should be given every 10 years. Tdap may be given as one of these boosters if you have not already gotten a dose. Tdap may also be given after a severe cut or burn to prevent tetanus infection. Your doctor can give you more information. Tdap may safely be given at the same time as other vaccines. SOME PEOPLE SHOULD NOT GET THIS VACCINE  If you ever had a life-threatening allergic reaction after a dose of any tetanus, diphtheria, or pertussis containing vaccine, OR if you have a severe allergy to any part of this vaccine, you should not get Tdap. Tell your doctor if you have any severe allergies.  If you had a coma, or long or multiple seizures within 7 days after a childhood dose of DTP or DTaP, you should not get Tdap, unless a cause other than the vaccine was found. You can still get Td.  Talk to your doctor if you:  have epilepsy or another nervous system problem,  had severe pain or swelling after any vaccine containing diphtheria, tetanus or pertussis,  ever had Guillain-Barr Syndrome (GBS),  aren't feeling well on the day the shot is scheduled. RISKS OF A VACCINE REACTION With any medicine, including vaccines, there is a chance of side effects. These are usually mild and go away on their own, but serious reactions are also possible. Brief fainting spells can follow a vaccination, leading to injuries from falling. Sitting or lying down for about 15 minutes can help prevent these. Tell your doctor if you feel dizzy or light-headed, or   have vision changes or ringing in the ears. Mild problems following Tdap (Did not interfere with activities)  Pain where the shot was given (about 3 in 4 adolescents or 2 in 3 adults)  Redness or swelling where the shot was given (about 1 person in 5)  Mild fever of at least 100.52F (up to about 1 in 25 adolescents or 1 in  100 adults)  Headache (about 3 or 4 people in 10)  Tiredness (about 1 person in 3 or 4)  Nausea, vomiting, diarrhea, stomach ache (up to 1 in 4 adolescents or 1 in 10 adults)  Chills, body aches, sore joints, rash, swollen glands (uncommon) Moderate problems following Tdap (Interfered with activities, but did not require medical attention)  Pain where the shot was given (about 1 in 5 adolescents or 1 in 100 adults)  Redness or swelling where the shot was given (up to about 1 in 16 adolescents or 1 in 25 adults)  Fever over 102F (about 1 in 100 adolescents or 1 in 250 adults)  Headache (about 3 in 20 adolescents or 1 in 10 adults)  Nausea, vomiting, diarrhea, stomach ache (up to 1 or 3 people in 100)  Swelling of the entire arm where the shot was given (up to about 3 in 100). Severe problems following Tdap (Unable to perform usual activities, required medical attention)  Swelling, severe pain, bleeding and redness in the arm where the shot was given (rare). A severe allergic reaction could occur after any vaccine (estimated less than 1 in a million doses). WHAT IF THERE IS A SERIOUS REACTION? What should I look for?  Look for anything that concerns you, such as signs of a severe allergic reaction, very high fever, or behavior changes. Signs of a severe allergic reaction can include hives, swelling of the face and throat, difficulty breathing, a fast heartbeat, dizziness, and weakness. These would start a few minutes to a few hours after the vaccination. What should I do?  If you think it is a severe allergic reaction or other emergency that can't wait, call 9-1-1 or get the person to the nearest hospital. Otherwise, call your doctor.  Afterward, the reaction should be reported to the "Vaccine Adverse Event Reporting System" (VAERS). Your doctor might file this report, or you can do it yourself through the VAERS web site at www.vaers.LAgents.nohhs.gov, or by calling 1-920-655-9311. VAERS is  only for reporting reactions. They do not give medical advice.  THE NATIONAL VACCINE INJURY COMPENSATION PROGRAM The National Vaccine Injury Compensation Program (VICP) is a federal program that was created to compensate people who may have been injured by certain vaccines. Persons who believe they may have been injured by a vaccine can learn about the program and about filing a claim by calling 1-450-117-6566 or visiting the VICP website at SpiritualWord.atwww.hrsa.gov/vaccinecompensation. HOW CAN I LEARN MORE?  Ask your doctor.  Call your local or state health department.  Contact the Centers for Disease Control and Prevention (CDC):  Call 743-570-72431-978 150 1834 or visit CDC's website at PicCapture.uywww.cdc.gov/vaccines. CDC Tdap Vaccine VIS (07/09/11) Document Released: 08/18/2011 Document Revised: 06/13/2012 Document Reviewed: 06/08/2012 ExitCare Patient Information 2015 [employer] [place]. This information is not intended to replace advice given to you by your health care provider. Make sure you discuss any questions you have with your health care provider. Hypertension Hypertension, commonly called high blood pressure, is when the force of blood pumping through your arteries is too strong. Your arteries are the blood vessels that carry blood from your heart  throughout your body. A blood pressure reading consists of a higher number over a lower number, such as 110/72. The higher number (systolic) is the pressure inside your arteries when your heart pumps. The lower number (diastolic) is the pressure inside your arteries when your heart relaxes. Ideally you want your blood pressure below 120/80. Hypertension forces your heart to work harder to pump blood. Your arteries may become narrow or stiff. Having hypertension puts you at risk for heart disease, stroke, and other problems.  RISK FACTORS Some risk factors for high blood pressure are controllable. Others are not.  Risk factors you cannot control include:   Race. You may be  at higher risk if you are African American.  Age. Risk increases with age.  Gender. Men are at higher risk than women before age 57 years. After age 57, women are at higher risk than men. Risk factors you can control include:  Not getting enough exercise or physical activity.  Being overweight.  Getting too much fat, sugar, calories, or salt in your diet.  Drinking too much alcohol. SIGNS AND SYMPTOMS Hypertension does not usually cause signs or symptoms. Extremely high blood pressure (hypertensive crisis) may cause headache, anxiety, shortness of breath, and nosebleed. DIAGNOSIS  To check if you have hypertension, your health care provider will measure your blood pressure while you are seated, with your arm held at the level of your heart. It should be measured at least twice using the same arm. Certain conditions can cause a difference in blood pressure between your right and left arms. A blood pressure reading that is higher than normal on one occasion does not mean that you need treatment. If one blood pressure reading is high, ask your health care provider about having it checked again. TREATMENT  Treating high blood pressure includes making lifestyle changes and possibly taking medication. Living a healthy lifestyle can help lower high blood pressure. You may need to change some of your habits. Lifestyle changes may include:  Following the DASH diet. This diet is high in fruits, vegetables, and whole grains. It is low in salt, red meat, and added sugars.  Getting at least 2 1/2 hours of brisk physical activity every week.  Losing weight if necessary.  Not smoking.  Limiting alcoholic beverages.  Learning ways to reduce stress. If lifestyle changes are not enough to get your blood pressure under control, your health care provider may prescribe medicine. You may need to take more than one. Work closely with your health care provider to understand the risks and benefits. HOME  CARE INSTRUCTIONS  Have your blood pressure rechecked as directed by your health care provider.   Only take medicine as directed by your health care provider. Follow the directions carefully. Blood pressure medicines must be taken as prescribed. The medicine does not work as well when you skip doses. Skipping doses also puts you at risk for problems.   Do not smoke.   Monitor your blood pressure at home as directed by your health care provider. SEEK MEDICAL CARE IF:   You think you are having a reaction to medicines taken.  You have recurrent headaches or feel dizzy.  You have swelling in your ankles.  You have trouble with your vision. SEEK IMMEDIATE MEDICAL CARE IF:  You develop a severe headache or confusion.  You have unusual weakness, numbness, or feel faint.  You have severe chest or abdominal pain.  You vomit repeatedly.  You have trouble breathing. MAKE SURE YOU:  Understand these instructions.  Will watch your condition.  Will get help right away if you are not doing well or get worse. Document Released: 02/16/2005 Document Revised: 02/21/2013 Document Reviewed: 12/09/2012 ExitCare Patient Information 2015 [employer]. This information is not intended to replace advice given to you by your health care provider. Make sure you discuss any questions you have with your health care provider.  

## 2013-09-05 NOTE — Progress Notes (Signed)
Subjective:    Patient ID: Tyler Burnett, male    DOB: 08/19/1955, 58 y.o.   MRN: [id number]  Diabetes He presents for his follow-up diabetic visit. He has type 2 diabetes mellitus. His disease course has been stable. Pertinent negatives for hypoglycemia include no confusion, dizziness or headaches. Pertinent negatives for diabetes include no blurred vision, no foot paresthesias, no foot ulcerations and no visual change. There are no hypoglycemic complications. Symptoms are stable. There are no diabetic complications. Pertinent negatives for diabetic complications include no CVA, heart disease or nephropathy. Risk factors for coronary artery disease include diabetes mellitus, hypertension and male sex. Current diabetic treatment includes oral agent (monotherapy). He is compliant with treatment some of the time. His weight is stable. He is following a diabetic diet. He participates in exercise three times a week. An ACE inhibitor/angiotensin II receptor blocker is being taken. Eye exam is not current.  Hypertension This is a chronic problem. The current episode started more than 1 year ago. The problem has been waxing and waning since onset. The problem is uncontrolled. Pertinent negatives include no blurred vision or headaches. Risk factors for coronary artery disease include diabetes mellitus, male gender and obesity. Past treatments include ACE inhibitors. The current treatment provides mild improvement. There is no history of kidney disease, CAD/MI, CVA or a thyroid problem. There is no history of sleep apnea.      Review of Systems  Constitutional: Negative.   HENT: Negative.   Eyes: Negative for blurred vision.  Respiratory: Negative.   Cardiovascular: Negative.   Gastrointestinal: Negative.   Endocrine: Negative.   Genitourinary: Negative.   Musculoskeletal: Negative.   Neurological: Negative.  Negative for dizziness and headaches.  Hematological: Negative.   Psychiatric/Behavioral:  Negative.  Negative for confusion.  All other systems reviewed and are negative.      Objective:   Physical Exam  Vitals reviewed. Constitutional: He is oriented to person, place, and time. He appears well-developed and well-nourished. No distress.  HENT:  Head: Normocephalic.  Right Ear: External ear normal.  Left Ear: External ear normal.  Mouth/Throat: Oropharynx is clear and moist.  Eyes: Pupils are equal, round, and reactive to light. Right eye exhibits no discharge. Left eye exhibits no discharge.  Neck: Normal range of motion. Neck supple. No thyromegaly present.  Cardiovascular: Normal rate, regular rhythm, normal heart sounds and intact distal pulses.   No murmur heard. Pulmonary/Chest: Effort normal and breath sounds normal. No respiratory distress. He has no wheezes.  Abdominal: Soft. Bowel sounds are normal. He exhibits no distension. There is no tenderness.  Musculoskeletal: Normal range of motion. He exhibits no edema and no tenderness.  Neurological: He is alert and oriented to person, place, and time. He has normal reflexes. No cranial nerve deficit.  Skin: Skin is warm and dry. No rash noted. No erythema.  Psychiatric: He has a normal mood and affect. His behavior is normal. Judgment and thought content normal.   See diabetic foot note   BP 140/80  Pulse 52  Temp(Src) 99 F (37.2 C) (Oral)  Ht 6' (1.829 m)  Wt 259 lb (117.482 kg)  BMI 35.12 kg/m2      Assessment & Plan:  1. Essential hypertension, benign - CMP14+EGFR - lisinopril (PRINIVIL,ZESTRIL) 40 MG tablet; Take 1 tablet (40 mg total) by mouth daily.  Dispense: 90 tablet; Refill: 3  2. Type 2 diabetes mellitus with hyperglycemia - POCT glycosylated hemoglobin (Hb A1C) - metFORMIN (GLUCOPHAGE) 500 MG tablet; Take 1 tablet (  500 mg total) by mouth daily.  Dispense: 90 tablet; Refill: 1  3. Type 2 diabetes mellitus without complication  4. Prostate cancer screening - PSA, total and free  5.  Encounter for vitamin deficiency screening - Vit D  25 hydroxy (rtn osteoporosis monitoring)  6. Annual physical exam - Lipid panel   Continue all meds Labs pending Health Maintenance reviewed Diet and exercise encouraged RTO 2 weeks  Evelina Dun, FNP

## 2013-09-06 ENCOUNTER — Other Ambulatory Visit: Payer: Self-pay | Admitting: Family

## 2013-09-06 LAB — PSA, TOTAL AND FREE
PSA FREE: 0.17 ng/mL
PSA, Free Pct: 42.5 %
PSA: 0.4 ng/mL (ref 0.0–4.0)

## 2013-09-06 LAB — CMP14+EGFR
A/G RATIO: 1.8 (ref 1.1–2.5)
ALK PHOS: 63 IU/L (ref 39–117)
ALT: 16 IU/L (ref 0–44)
AST: 14 IU/L (ref 0–40)
Albumin: 4.4 g/dL (ref 3.5–5.5)
BILIRUBIN TOTAL: 0.4 mg/dL (ref 0.0–1.2)
BUN / CREAT RATIO: 10 (ref 9–20)
BUN: 10 mg/dL (ref 6–24)
CHLORIDE: 99 mmol/L (ref 97–108)
CO2: 23 mmol/L (ref 18–29)
Calcium: 9.4 mg/dL (ref 8.7–10.2)
Creatinine, Ser: 0.97 mg/dL (ref 0.76–1.27)
GFR calc non Af Amer: 87 mL/min/{1.73_m2} (ref >59)
GFR, EST AFRICAN AMERICAN: 100 mL/min/{1.73_m2} (ref >59)
Globulin, Total: 2.5 g/dL (ref 1.5–4.5)
Glucose: 97 mg/dL (ref 65–99)
POTASSIUM: 4.6 mmol/L (ref 3.5–5.2)
Sodium: 140 mmol/L (ref 134–144)
Total Protein: 6.9 g/dL (ref 6.0–8.5)

## 2013-09-06 LAB — LIPID PANEL
CHOLESTEROL TOTAL: 192 mg/dL (ref 100–199)
Chol/HDL Ratio: 4.1 ratio units (ref 0.0–5.0)
HDL: 47 mg/dL (ref >39)
LDL Calculated: 121 mg/dL — ABNORMAL HIGH (ref 0–99)
Triglycerides: 119 mg/dL (ref 0–149)
VLDL Cholesterol Cal: 24 mg/dL (ref 5–40)

## 2013-09-06 LAB — VITAMIN D 25 HYDROXY (VIT D DEFICIENCY, FRACTURES): VIT D 25 HYDROXY: 19.6 ng/mL — AB (ref 30.0–100.0)

## 2013-09-06 MED ORDER — VITAMIN D (ERGOCALCIFEROL) 1.25 MG (50000 UNIT) PO CAPS: 50000.0000 [IU] | ORAL_CAPSULE | ORAL | Status: DC

## 2013-09-06 MED ORDER — SIMVASTATIN 20 MG PO TABS: 20.0000 mg | ORAL_TABLET | Freq: Every day | ORAL | Status: DC

## 2013-09-18 ENCOUNTER — Ambulatory Visit (INDEPENDENT_AMBULATORY_CARE_PROVIDER_SITE_OTHER): Payer: BC Managed Care – PPO | Admitting: Family

## 2013-09-18 ENCOUNTER — Encounter: Payer: Self-pay | Admitting: Family

## 2013-09-18 VITALS — BP 153/78 | HR 60 | Temp 98.7°F | Ht 72.0 in | Wt 259.4 lb

## 2013-09-18 DIAGNOSIS — I10 Essential (primary) hypertension: Secondary | ICD-10-CM

## 2013-09-18 NOTE — Patient Instructions (Signed)
DASH Eating Plan DASH stands for "Dietary Approaches to Stop Hypertension." The DASH eating plan is a healthy eating plan that has been shown to reduce high blood pressure (hypertension). Additional health benefits may include reducing the risk of type 2 diabetes mellitus, heart disease, and stroke. The DASH eating plan may also help with weight loss. WHAT DO I NEED TO KNOW ABOUT THE DASH EATING PLAN? For the DASH eating plan, you will follow these general guidelines:  Choose foods with a percent daily value for sodium of less than 5% (as listed on the food label).  Use salt-free seasonings or herbs instead of table salt or sea salt.  Check with your health care provider or pharmacist before using salt substitutes.  Eat lower-sodium products, often labeled as "lower sodium" or "no salt added."  Eat fresh foods.  Eat more vegetables, fruits, and low-fat dairy products.  Choose whole grains. Look for the word "whole" as the first word in the ingredient list.  Choose fish and skinless chicken or turkey more often than red meat. Limit fish, poultry, and meat to 6 oz (170 g) each day.  Limit sweets, desserts, sugars, and sugary drinks.  Choose heart-healthy fats.  Limit cheese to 1 oz (28 g) per day.  Eat more home-cooked food and less restaurant, buffet, and fast food.  Limit fried foods.  Cook foods using methods other than frying.  Limit canned vegetables. If you do use them, rinse them well to decrease the sodium.  When eating at a restaurant, ask that your food be prepared with less salt, or no salt if possible. WHAT FOODS CAN I EAT? Seek help from a dietitian for individual calorie needs. Grains Whole grain or whole wheat bread. Brown rice. Whole grain or whole wheat pasta. Quinoa, bulgur, and whole grain cereals. Low-sodium cereals. Corn or whole wheat flour tortillas. Whole grain cornbread. Whole grain crackers. Low-sodium crackers. Vegetables Fresh or frozen vegetables  (raw, steamed, roasted, or grilled). Low-sodium or reduced-sodium tomato and vegetable juices. Low-sodium or reduced-sodium tomato sauce and paste. Low-sodium or reduced-sodium canned vegetables.  Fruits All fresh, canned (in natural juice), or frozen fruits. Meat and Other Protein Products Ground beef (85% or leaner), grass-fed beef, or beef trimmed of fat. Skinless chicken or turkey. Ground chicken or turkey. Pork trimmed of fat. All fish and seafood. Eggs. Dried beans, peas, or lentils. Unsalted nuts and seeds. Unsalted canned beans. Dairy Low-fat dairy products, such as skim or 1% milk, 2% or reduced-fat cheeses, low-fat ricotta or cottage cheese, or plain low-fat yogurt. Low-sodium or reduced-sodium cheeses. Fats and Oils Tub margarines without trans fats. Light or reduced-fat mayonnaise and salad dressings (reduced sodium). Avocado. Safflower, olive, or canola oils. Natural peanut or almond butter. Other Unsalted popcorn and pretzels. The items listed above may not be a complete list of recommended foods or beverages. Contact your dietitian for more options. WHAT FOODS ARE NOT RECOMMENDED? Grains White bread. White pasta. White rice. Refined cornbread. Bagels and croissants. Crackers that contain trans fat. Vegetables Creamed or fried vegetables. Vegetables in a cheese sauce. Regular canned vegetables. Regular canned tomato sauce and paste. Regular tomato and vegetable juices. Fruits Dried fruits. Canned fruit in light or heavy syrup. Fruit juice. Meat and Other Protein Products Fatty cuts of meat. Ribs, chicken wings, bacon, sausage, bologna, salami, chitterlings, fatback, hot dogs, bratwurst, and packaged luncheon meats. Salted nuts and seeds. Canned beans with salt. Dairy Whole or 2% milk, cream, half-and-half, and cream cheese. Whole-fat or sweetened yogurt. Full-fat   cheeses or blue cheese. Nondairy creamers and whipped toppings. Processed cheese, cheese spreads, or cheese  curds. Condiments Onion and garlic salt, seasoned salt, table salt, and sea salt. Canned and packaged gravies. Worcestershire sauce. Tartar sauce. Barbecue sauce. Teriyaki sauce. Soy sauce, including reduced sodium. Steak sauce. Fish sauce. Oyster sauce. Cocktail sauce. Horseradish. Ketchup and mustard. Meat flavorings and tenderizers. Bouillon cubes. Hot sauce. Tabasco sauce. Marinades. Taco seasonings. Relishes. Fats and Oils Butter, stick margarine, lard, shortening, ghee, and bacon fat. Coconut, palm kernel, or palm oils. Regular salad dressings. Other Pickles and olives. Salted popcorn and pretzels. The items listed above may not be a complete list of foods and beverages to avoid. Contact your dietitian for more information. WHERE CAN I FIND MORE INFORMATION? National Heart, Lung, and Blood Institute: www.nhlbi.nih.gov/health/health-topics/topics/dash/ Document Released: 02/05/2011 Document Revised: 02/21/2013 Document Reviewed: 12/21/2012 Exit[employer] Patient Information 2015 [employer]. This information is not intended to replace advice given to you by your health care provider. Make sure you discuss any questions you have with your health care provider. Hypertension Hypertension, commonly called high blood pressure, is when the force of blood pumping through your arteries is too strong. Your arteries are the blood vessels that carry blood from your heart throughout your body. A blood pressure reading consists of a higher number over a lower number, such as 110/72. The higher number (systolic) is the pressure inside your arteries when your heart pumps. The lower number (diastolic) is the pressure inside your arteries when your heart relaxes. Ideally you want your blood pressure below 120/80. Hypertension forces your heart to work harder to pump blood. Your arteries may become narrow or stiff. Having hypertension puts you at risk for heart disease, stroke, and other problems.  RISK  FACTORS Some risk factors for high blood pressure are controllable. Others are not.  Risk factors you cannot control include:   Race. You may be at higher risk if you are African American.  Age. Risk increases with age.  Gender. Men are at higher risk than women before age 45 years. After age 65, women are at higher risk than men. Risk factors you can control include:  Not getting enough exercise or physical activity.  Being overweight.  Getting too much fat, sugar, calories, or salt in your diet.  Drinking too much alcohol. SIGNS AND SYMPTOMS Hypertension does not usually cause signs or symptoms. Extremely high blood pressure (hypertensive crisis) may cause headache, anxiety, shortness of breath, and nosebleed. DIAGNOSIS  To check if you have hypertension, your health care provider will measure your blood pressure while you are seated, with your arm held at the level of your heart. It should be measured at least twice using the same arm. Certain conditions can cause a difference in blood pressure between your right and left arms. A blood pressure reading that is higher than normal on one occasion does not mean that you need treatment. If one blood pressure reading is high, ask your health care provider about having it checked again. TREATMENT  Treating high blood pressure includes making lifestyle changes and possibly taking medication. Living a healthy lifestyle can help lower high blood pressure. You may need to change some of your habits. Lifestyle changes may include:  Following the DASH diet. This diet is high in fruits, vegetables, and whole grains. It is low in salt, red meat, and added sugars.  Getting at least 2 1/2 hours of brisk physical activity every week.  Losing weight if necessary.  Not smoking.    Limiting alcoholic beverages.  Learning ways to reduce stress. If lifestyle changes are not enough to get your blood pressure under control, your health care provider  may prescribe medicine. You may need to take more than one. Work closely with your health care provider to understand the risks and benefits. HOME CARE INSTRUCTIONS  Have your blood pressure rechecked as directed by your health care provider.   Only take medicine as directed by your health care provider. Follow the directions carefully. Blood pressure medicines must be taken as prescribed. The medicine does not work as well when you skip doses. Skipping doses also puts you at risk for problems.   Do not smoke.   Monitor your blood pressure at home as directed by your health care provider. SEEK MEDICAL CARE IF:   You think you are having a reaction to medicines taken.  You have recurrent headaches or feel dizzy.  You have swelling in your ankles.  You have trouble with your vision. SEEK IMMEDIATE MEDICAL CARE IF:  You develop a severe headache or confusion.  You have unusual weakness, numbness, or feel faint.  You have severe chest or abdominal pain.  You vomit repeatedly.  You have trouble breathing. MAKE SURE YOU:   Understand these instructions.  Will watch your condition.  Will get help right away if you are not doing well or get worse. Document Released: 02/16/2005 Document Revised: 02/21/2013 Document Reviewed: 12/09/2012 Exit[employer] Patient Information 2015 [employer]. This information is not intended to replace advice given to you by your health care provider. Make sure you discuss any questions you have with your health care provider.  

## 2013-09-18 NOTE — Progress Notes (Signed)
   Subjective:    Patient ID: Tyler Burnett, male    DOB: Apr 14, 1955, 58 y.o.   MRN: [id number]  Hypertension   Pt here for follow-up for blood pressure. Pt B/P is still elevated today, but the pt states that he believes he has still been taking his Lisinopril 20 mg not the increased dose of 40 mg. Pt states he mixed the two different mg's in his pill container and has not been taking the right dose. Pt states he will separate the two today and start his new dose.     Review of Systems  Constitutional: Negative.   HENT: Negative.   Respiratory: Negative.   Cardiovascular: Negative.   Gastrointestinal: Negative.   Endocrine: Negative.   Genitourinary: Negative.   Musculoskeletal: Negative.   Neurological: Negative.   Hematological: Negative.   Psychiatric/Behavioral: Negative.   All other systems reviewed and are negative.      Objective:   Physical Exam  Vitals reviewed. Constitutional: He is oriented to person, place, and time. He appears well-developed and well-nourished. No distress.  Cardiovascular: Normal rate, regular rhythm, normal heart sounds and intact distal pulses.   No murmur heard. Pulmonary/Chest: Effort normal and breath sounds normal. No respiratory distress. He has no wheezes.  Abdominal: Soft. Bowel sounds are normal. He exhibits no distension. There is no tenderness.  Musculoskeletal: Normal range of motion. He exhibits no edema and no tenderness.  Neurological: He is alert and oriented to person, place, and time. He has normal reflexes. No cranial nerve deficit.  Skin: Skin is warm and dry. No rash noted. No erythema.  Psychiatric: He has a normal mood and affect. His behavior is normal. Judgment and thought content normal.    BP 153/78  Pulse 60  Temp(Src) 98.7 F (37.1 C) (Oral)  Ht 6' (1.829 m)  Wt 259 lb 6.4 oz (117.663 kg)  BMI 35.17 kg/m2       Assessment & Plan:  1. Essential hypertension, benign -Dash diet information given -Exercise  encouraged - Stress Management  -Continue current meds-Start new dose of 40 mg of lisinopril  -RTO in 2 weeks   Jannifer Rodneyhristy Pratham Cassatt, FNP

## 2013-10-09 ENCOUNTER — Encounter: Payer: Self-pay | Admitting: Family

## 2013-10-09 ENCOUNTER — Ambulatory Visit (INDEPENDENT_AMBULATORY_CARE_PROVIDER_SITE_OTHER): Payer: BC Managed Care – PPO | Admitting: Family

## 2013-10-09 ENCOUNTER — Encounter (INDEPENDENT_AMBULATORY_CARE_PROVIDER_SITE_OTHER): Payer: Self-pay

## 2013-10-09 VITALS — BP 150/77 | HR 57 | Temp 98.7°F | Ht 72.0 in | Wt 261.2 lb

## 2013-10-09 DIAGNOSIS — I10 Essential (primary) hypertension: Secondary | ICD-10-CM

## 2013-10-09 MED ORDER — HYDROCHLOROTHIAZIDE 25 MG PO TABS: 25.0000 mg | ORAL_TABLET | Freq: Every day | ORAL | Status: DC

## 2013-10-09 NOTE — Patient Instructions (Signed)
DASH Eating Plan DASH stands for "Dietary Approaches to Stop Hypertension." The DASH eating plan is a healthy eating plan that has been shown to reduce high blood pressure (hypertension). Additional health benefits may include reducing the risk of type 2 diabetes mellitus, heart disease, and stroke. The DASH eating plan may also help with weight loss. WHAT DO I NEED TO KNOW ABOUT THE DASH EATING PLAN? For the DASH eating plan, you will follow these general guidelines:  Choose foods with a percent daily value for sodium of less than 5% (as listed on the food label).  Use salt-free seasonings or herbs instead of table salt or sea salt.  Check with your health care provider or pharmacist before using salt substitutes.  Eat lower-sodium products, often labeled as "lower sodium" or "no salt added."  Eat fresh foods.  Eat more vegetables, fruits, and low-fat dairy products.  Choose whole grains. Look for the word "whole" as the first word in the ingredient list.  Choose fish and skinless chicken or turkey more often than red meat. Limit fish, poultry, and meat to 6 oz (170 g) each day.  Limit sweets, desserts, sugars, and sugary drinks.  Choose heart-healthy fats.  Limit cheese to 1 oz (28 g) per day.  Eat more home-cooked food and less restaurant, buffet, and fast food.  Limit fried foods.  Cook foods using methods other than frying.  Limit canned vegetables. If you do use them, rinse them well to decrease the sodium.  When eating at a restaurant, ask that your food be prepared with less salt, or no salt if possible. WHAT FOODS CAN I EAT? Seek help from a dietitian for individual calorie needs. Grains Whole grain or whole wheat bread. Brown rice. Whole grain or whole wheat pasta. Quinoa, bulgur, and whole grain cereals. Low-sodium cereals. Corn or whole wheat flour tortillas. Whole grain cornbread. Whole grain crackers. Low-sodium crackers. Vegetables Fresh or frozen vegetables  (raw, steamed, roasted, or grilled). Low-sodium or reduced-sodium tomato and vegetable juices. Low-sodium or reduced-sodium tomato sauce and paste. Low-sodium or reduced-sodium canned vegetables.  Fruits All fresh, canned (in natural juice), or frozen fruits. Meat and Other Protein Products Ground beef (85% or leaner), grass-fed beef, or beef trimmed of fat. Skinless chicken or turkey. Ground chicken or turkey. Pork trimmed of fat. All fish and seafood. Eggs. Dried beans, peas, or lentils. Unsalted nuts and seeds. Unsalted canned beans. Dairy Low-fat dairy products, such as skim or 1% milk, 2% or reduced-fat cheeses, low-fat ricotta or cottage cheese, or plain low-fat yogurt. Low-sodium or reduced-sodium cheeses. Fats and Oils Tub margarines without trans fats. Light or reduced-fat mayonnaise and salad dressings (reduced sodium). Avocado. Safflower, olive, or canola oils. Natural peanut or almond butter. Other Unsalted popcorn and pretzels. The items listed above may not be a complete list of recommended foods or beverages. Contact your dietitian for more options. WHAT FOODS ARE NOT RECOMMENDED? Grains White bread. White pasta. White rice. Refined cornbread. Bagels and croissants. Crackers that contain trans fat. Vegetables Creamed or fried vegetables. Vegetables in a cheese sauce. Regular canned vegetables. Regular canned tomato sauce and paste. Regular tomato and vegetable juices. Fruits Dried fruits. Canned fruit in light or heavy syrup. Fruit juice. Meat and Other Protein Products Fatty cuts of meat. Ribs, chicken wings, bacon, sausage, bologna, salami, chitterlings, fatback, hot dogs, bratwurst, and packaged luncheon meats. Salted nuts and seeds. Canned beans with salt. Dairy Whole or 2% milk, cream, half-and-half, and cream cheese. Whole-fat or sweetened yogurt. Full-fat   cheeses or blue cheese. Nondairy creamers and whipped toppings. Processed cheese, cheese spreads, or cheese  curds. Condiments Onion and garlic salt, seasoned salt, table salt, and sea salt. Canned and packaged gravies. Worcestershire sauce. Tartar sauce. Barbecue sauce. Teriyaki sauce. Soy sauce, including reduced sodium. Steak sauce. Fish sauce. Oyster sauce. Cocktail sauce. Horseradish. Ketchup and mustard. Meat flavorings and tenderizers. Bouillon cubes. Hot sauce. Tabasco sauce. Marinades. Taco seasonings. Relishes. Fats and Oils Butter, stick margarine, lard, shortening, ghee, and bacon fat. Coconut, palm kernel, or palm oils. Regular salad dressings. Other Pickles and olives. Salted popcorn and pretzels. The items listed above may not be a complete list of foods and beverages to avoid. Contact your dietitian for more information. WHERE CAN I FIND MORE INFORMATION? National Heart, Lung, and Blood Institute: www.nhlbi.nih.gov/health/health-topics/topics/dash/ Document Released: 02/05/2011 Document Revised: 07/03/2013 Document Reviewed: 12/21/2012 [employer] Patient Information 2015 [employer]. This information is not intended to replace advice given to you by your health care provider. Make sure you discuss any questions you have with your health care provider. Hypertension Hypertension, commonly called high blood pressure, is when the force of blood pumping through your arteries is too strong. Your arteries are the blood vessels that carry blood from your heart throughout your body. A blood pressure reading consists of a higher number over a lower number, such as 110/72. The higher number (systolic) is the pressure inside your arteries when your heart pumps. The lower number (diastolic) is the pressure inside your arteries when your heart relaxes. Ideally you want your blood pressure below 120/80. Hypertension forces your heart to work harder to pump blood. Your arteries may become narrow or stiff. Having hypertension puts you at risk for heart disease, stroke, and other problems.  RISK  FACTORS Some risk factors for high blood pressure are controllable. Others are not.  Risk factors you cannot control include:   Race. You may be at higher risk if you are African American.  Age. Risk increases with age.  Gender. Men are at higher risk than women before age 45 years. After age 65, women are at higher risk than men. Risk factors you can control include:  Not getting enough exercise or physical activity.  Being overweight.  Getting too much fat, sugar, calories, or salt in your diet.  Drinking too much alcohol. SIGNS AND SYMPTOMS Hypertension does not usually cause signs or symptoms. Extremely high blood pressure (hypertensive crisis) may cause headache, anxiety, shortness of breath, and nosebleed. DIAGNOSIS  To check if you have hypertension, your health care provider will measure your blood pressure while you are seated, with your arm held at the level of your heart. It should be measured at least twice using the same arm. Certain conditions can cause a difference in blood pressure between your right and left arms. A blood pressure reading that is higher than normal on one occasion does not mean that you need treatment. If one blood pressure reading is high, ask your health care provider about having it checked again. TREATMENT  Treating high blood pressure includes making lifestyle changes and possibly taking medicine. Living a healthy lifestyle can help lower high blood pressure. You may need to change some of your habits. Lifestyle changes may include:  Following the DASH diet. This diet is high in fruits, vegetables, and whole grains. It is low in salt, red meat, and added sugars.  Getting at least 2 hours of brisk physical activity every week.  Losing weight if necessary.  Not smoking.  Limiting   alcoholic beverages.  Learning ways to reduce stress. If lifestyle changes are not enough to get your blood pressure under control, your health care provider may  prescribe medicine. You may need to take more than one. Work closely with your health care provider to understand the risks and benefits. HOME CARE INSTRUCTIONS  Have your blood pressure rechecked as directed by your health care provider.   Take medicines only as directed by your health care provider. Follow the directions carefully. Blood pressure medicines must be taken as prescribed. The medicine does not work as well when you skip doses. Skipping doses also puts you at risk for problems.   Do not smoke.   Monitor your blood pressure at home as directed by your health care provider. SEEK MEDICAL CARE IF:   You think you are having a reaction to medicines taken.  You have recurrent headaches or feel dizzy.  You have swelling in your ankles.  You have trouble with your vision. SEEK IMMEDIATE MEDICAL CARE IF:  You develop a severe headache or confusion.  You have unusual weakness, numbness, or feel faint.  You have severe chest or abdominal pain.  You vomit repeatedly.  You have trouble breathing. MAKE SURE YOU:   Understand these instructions.  Will watch your condition.  Will get help right away if you are not doing well or get worse. Document Released: 02/16/2005 Document Revised: 07/03/2013 Document Reviewed: 12/09/2012 Exit[employer] Patient Information 2015 [employer]. This information is not intended to replace advice given to you by your health care provider. Make sure you discuss any questions you have with your health care provider.  

## 2013-10-09 NOTE — Progress Notes (Signed)
   Subjective:    Patient ID: Tyler Burnett, male    DOB: 04-24-1955, 58 y.o.   MRN: [id number]  Hypertension   Pt here for follow-up for hypertension. Pt was started on Lisinopril 40 mg daily. Pt states he has been taking his Lisinopril daily. States he feels stressed and tired, but denies any pain, headache, SOB, or edema at this time.    Review of Systems  Constitutional: Positive for fatigue.  HENT: Negative.   Respiratory: Negative.   Cardiovascular: Negative.   Gastrointestinal: Negative.   Endocrine: Negative.   Genitourinary: Negative.   Musculoskeletal: Negative.   Neurological: Negative.   Hematological: Negative.   Psychiatric/Behavioral: Negative.   All other systems reviewed and are negative.      Objective:   Physical Exam  Vitals reviewed. Constitutional: He is oriented to person, place, and time. He appears well-developed and well-nourished. No distress.  HENT:  Head: Normocephalic.  Right Ear: External ear normal.  Left Ear: External ear normal.  Mouth/Throat: Oropharynx is clear and moist.  Eyes: Pupils are equal, round, and reactive to light. Right eye exhibits no discharge. Left eye exhibits no discharge.  Neck: Normal range of motion. Neck supple. No thyromegaly present.  Cardiovascular: Normal rate, regular rhythm, normal heart sounds and intact distal pulses.   No murmur heard. Pulmonary/Chest: Effort normal and breath sounds normal. No respiratory distress. He has no wheezes.  Abdominal: Soft. Bowel sounds are normal. He exhibits no distension. There is no tenderness.  Musculoskeletal: Normal range of motion. He exhibits no edema and no tenderness.  Neurological: He is alert and oriented to person, place, and time. He has normal reflexes. No cranial nerve deficit.  Skin: Skin is warm and dry. No rash noted. No erythema.  Psychiatric: He has a normal mood and affect. His behavior is normal. Judgment and thought content normal.    BP 150/77  Pulse  57  Temp(Src) 98.7 F (37.1 C) (Oral)  Ht 6' (1.829 m)  Wt 261 lb 3.2 oz (118.48 kg)  BMI 35.42 kg/m2       Assessment & Plan:  1. Essential hypertension, benign -Daily blood pressure log given with instructions on how to fill out and told to bring to next visit -Dash diet information given -Exercise encouraged - Stress Management  -Continue current meds-Pt started on hydrochlorothiazide 25 mg today -RTO in 2 weeks  Jannifer Rodneyhristy Kenzli Barritt, FNP

## 2013-12-02 ENCOUNTER — Other Ambulatory Visit: Payer: Self-pay | Admitting: Family Medicine

## 2014-01-26 ENCOUNTER — Encounter: Payer: Self-pay | Admitting: Nurse Practitioner

## 2014-01-26 ENCOUNTER — Ambulatory Visit (INDEPENDENT_AMBULATORY_CARE_PROVIDER_SITE_OTHER): Payer: BC Managed Care – PPO | Admitting: Nurse Practitioner

## 2014-01-26 VITALS — BP 147/83 | HR 86 | Temp 99.2°F | Ht 73.83 in | Wt 247.2 lb

## 2014-01-26 DIAGNOSIS — R319 Hematuria, unspecified: Secondary | ICD-10-CM

## 2014-01-26 DIAGNOSIS — N3001 Acute cystitis with hematuria: Secondary | ICD-10-CM

## 2014-01-26 LAB — POCT URINALYSIS DIPSTICK
Bilirubin, UA: NEGATIVE
GLUCOSE UA: NEGATIVE
KETONES UA: NEGATIVE
Nitrite, UA: POSITIVE
SPEC GRAV UA: 1.02
UROBILINOGEN UA: NEGATIVE
pH, UA: 5

## 2014-01-26 LAB — POCT UA - MICROSCOPIC ONLY
CASTS, UR, LPF, POC: NEGATIVE
Crystals, Ur, HPF, POC: NEGATIVE
Yeast, UA: NEGATIVE

## 2014-01-26 MED ORDER — SULFAMETHOXAZOLE-TRIMETHOPRIM 800-160 MG PO TABS: 1.0000 | ORAL_TABLET | Freq: Two times a day (BID) | ORAL | Status: DC

## 2014-01-26 NOTE — Patient Instructions (Signed)

## 2014-01-26 NOTE — Progress Notes (Signed)
   Subjective:    Patient ID: Tyler Burnett, male    DOB: [birth year], 58 y.o.   MRN: [id number]  HPI Patient in today c/o visible blood in urine. Started Wednesday night- Has been forcing fluids and th eblood has cleared but it is very uncomfortable when he urinates.    Review of Systems  Constitutional: Positive for fever (low grade ).  HENT: Negative.   Respiratory: Negative.   Cardiovascular: Negative.   Gastrointestinal: Positive for constipation.  Genitourinary: Positive for dysuria, urgency, frequency and hematuria.  Neurological: Negative.   Psychiatric/Behavioral: Negative.        Objective:   Physical Exam  Constitutional: He appears well-developed and well-nourished.  Cardiovascular: Normal rate and regular rhythm.   Murmur (1/6 systolic) heard. Pulmonary/Chest: Effort normal and breath sounds normal.  Abdominal: Soft. Bowel sounds are normal. He exhibits no distension and no mass. There is no tenderness. There is no rebound and no guarding.  Neurological: He is alert.  Skin: Skin is warm.  Psychiatric: He has a normal mood and affect. His behavior is normal. Judgment and thought content normal.   Results for orders placed or performed in visit on 01/26/14  POCT UA - Microscopic Only  Result Value Ref Range   WBC, Ur, HPF, POC 75-100    RBC, urine, microscopic 10-15    Bacteria, U Microscopic mod    Mucus, UA occ    Epithelial cells, urine per micros occ    Crystals, Ur, HPF, POC neg    Casts, Ur, LPF, POC neg    Yeast, UA neg   POCT urinalysis dipstick  Result Value Ref Range   Color, UA straw    Clarity, UA cloudy    Glucose, UA neg    Bilirubin, UA neg    Ketones, UA neg    Spec Grav, UA 1.020    Blood, UA large    pH, UA 5.0    Protein, UA 2+    Urobilinogen, UA negative    Nitrite, UA pos    Leukocytes, UA moderate (2+)           Assessment & Plan:   1. Hematuria   2. Acute cystitis with hematuria    Force fluids AZO over the counter  X2 days RTO prn Culture pending  Mary-Margaret Daphine DeutscherMartin, FNP

## 2014-01-28 LAB — URINE CULTURE

## 2014-01-29 ENCOUNTER — Other Ambulatory Visit: Payer: Self-pay | Admitting: Nurse Practitioner

## 2014-01-29 MED ORDER — CIPROFLOXACIN HCL 500 MG PO TABS: 500.0000 mg | ORAL_TABLET | Freq: Two times a day (BID) | ORAL | Status: DC

## 2014-05-26 ENCOUNTER — Other Ambulatory Visit: Payer: Self-pay | Admitting: Family

## 2014-06-25 ENCOUNTER — Other Ambulatory Visit: Payer: Self-pay | Admitting: Family

## 2014-07-13 ENCOUNTER — Telehealth: Payer: Self-pay | Admitting: Family

## 2014-07-13 MED ORDER — METFORMIN HCL 500 MG PO TABS
500.0000 mg | ORAL_TABLET | Freq: Every day | ORAL | Status: DC
Start: 2014-07-13 — End: 2014-07-25

## 2014-07-13 NOTE — Telephone Encounter (Signed)
done

## 2014-07-23 ENCOUNTER — Ambulatory Visit: Payer: BC Managed Care – PPO | Admitting: Family

## 2014-07-25 ENCOUNTER — Ambulatory Visit (INDEPENDENT_AMBULATORY_CARE_PROVIDER_SITE_OTHER): Payer: BC Managed Care – PPO | Admitting: Family

## 2014-07-25 ENCOUNTER — Encounter: Payer: Self-pay | Admitting: Family

## 2014-07-25 VITALS — BP 122/81 | HR 60 | Temp 98.0°F | Ht 73.0 in | Wt 261.4 lb

## 2014-07-25 DIAGNOSIS — E1165 Type 2 diabetes mellitus with hyperglycemia: Secondary | ICD-10-CM

## 2014-07-25 DIAGNOSIS — I10 Essential (primary) hypertension: Secondary | ICD-10-CM | POA: Diagnosis not present

## 2014-07-25 DIAGNOSIS — E785 Hyperlipidemia, unspecified: Secondary | ICD-10-CM | POA: Insufficient documentation

## 2014-07-25 DIAGNOSIS — B009 Herpesviral infection, unspecified: Secondary | ICD-10-CM | POA: Insufficient documentation

## 2014-07-25 LAB — POCT GLYCOSYLATED HEMOGLOBIN (HGB A1C): HEMOGLOBIN A1C: 7.2

## 2014-07-25 LAB — POCT UA - MICROALBUMIN: MICROALBUMIN (UR) POC: 50 mg/L

## 2014-07-25 MED ORDER — SIMVASTATIN 20 MG PO TABS: 20.0000 mg | ORAL_TABLET | Freq: Every day | ORAL | Status: DC

## 2014-07-25 MED ORDER — METFORMIN HCL 500 MG PO TABS: 500.0000 mg | ORAL_TABLET | Freq: Every day | ORAL | Status: DC

## 2014-07-25 MED ORDER — VALACYCLOVIR HCL 500 MG PO TABS: 500.0000 mg | ORAL_TABLET | Freq: Every day | ORAL | Status: DC

## 2014-07-25 MED ORDER — LISINOPRIL 40 MG PO TABS: 40.0000 mg | ORAL_TABLET | Freq: Every day | ORAL | Status: DC

## 2014-07-25 NOTE — Patient Instructions (Signed)

## 2014-07-25 NOTE — Progress Notes (Signed)
Subjective:    Patient ID: Tyler Burnett, male    DOB: 1955-07-05, 59 y.o.   MRN: [id number]  Diabetes He presents for his follow-up diabetic visit. He has type 2 diabetes mellitus. There are no hypoglycemic associated symptoms. Pertinent negatives for hypoglycemia include no confusion or headaches. Pertinent negatives for diabetes include no blurred vision, no foot paresthesias, no foot ulcerations and no visual change. There are no hypoglycemic complications. Pertinent negatives for hypoglycemia complications include no blackouts and no hospitalization. Symptoms are stable. Pertinent negatives for diabetic complications include no CVA, heart disease, nephropathy or peripheral neuropathy. Risk factors for coronary artery disease include dyslipidemia, hypertension, male sex, family history and stress. Current diabetic treatment includes oral agent (monotherapy). He is compliant with treatment all of the time. He is following a generally healthy diet. An ACE inhibitor/angiotensin II receptor blocker is being taken. Eye exam is current.  Hypertension This is a chronic problem. The current episode started more than 1 year ago. The problem has been resolved since onset. The problem is controlled. Pertinent negatives include no blurred vision, headaches, palpitations, peripheral edema or shortness of breath. Risk factors for coronary artery disease include dyslipidemia, family history, male gender and sedentary lifestyle. Past treatments include ACE inhibitors. The current treatment provides significant improvement. There is no history of kidney disease, CAD/MI, CVA, heart failure or a thyroid problem. There is no history of sleep apnea.  Hyperlipidemia This is a new problem. The current episode started more than 1 year ago. The problem is uncontrolled. Recent lipid tests were reviewed and are high. Exacerbating diseases include diabetes. Factors aggravating his hyperlipidemia include fatty foods. Pertinent  negatives include no shortness of breath. Current antihyperlipidemic treatment includes diet change (Pt has been out of Zocor for "Months"). Risk factors for coronary artery disease include dyslipidemia, family history, diabetes mellitus, hypertension and male sex.      Review of Systems  Constitutional: Negative.   HENT: Negative.   Eyes: Negative for blurred vision.  Respiratory: Negative.  Negative for shortness of breath.   Cardiovascular: Negative.  Negative for palpitations.  Gastrointestinal: Negative.   Endocrine: Negative.   Genitourinary: Negative.   Musculoskeletal: Negative.   Neurological: Negative.  Negative for headaches.  Hematological: Negative.   Psychiatric/Behavioral: Negative.  Negative for confusion.  All other systems reviewed and are negative.      Objective:   Physical Exam  Constitutional: He is oriented to person, place, and time. He appears well-developed and well-nourished. No distress.  HENT:  Head: Normocephalic.  Right Ear: External ear normal.  Left Ear: External ear normal.  Nose: Nose normal.  Mouth/Throat: Oropharynx is clear and moist.  Eyes: Pupils are equal, round, and reactive to light. Right eye exhibits no discharge. Left eye exhibits no discharge.  Neck: Normal range of motion. Neck supple. No thyromegaly present.  Cardiovascular: Normal rate, regular rhythm, normal heart sounds and intact distal pulses.   No murmur heard. Pulmonary/Chest: Effort normal and breath sounds normal. No respiratory distress. He has no wheezes.  Abdominal: Soft. Bowel sounds are normal. He exhibits no distension. There is no tenderness.  Musculoskeletal: Normal range of motion. He exhibits no edema or tenderness.  Neurological: He is alert and oriented to person, place, and time. He has normal reflexes. No cranial nerve deficit.  Skin: Skin is warm and dry. No rash noted. No erythema.  Psychiatric: He has a normal mood and affect. His behavior is normal.  Judgment and thought content normal.  Vitals reviewed.  BP 122/81 mmHg  Pulse 60  Temp(Src) 98 F (36.7 C) (Oral)  Ht _0  (1.854 m)  Wt 261 lb 6.4 oz (118.57 kg)  BMI 34.49 kg/m2     Assessment & Plan:  1. Essential hypertension, benign - CMP14+EGFR - lisinopril (PRINIVIL,ZESTRIL) 40 MG tablet; Take 1 tablet (40 mg total) by mouth daily.  Dispense: 90 tablet; Refill: 3  2. Type 2 diabetes mellitus with hyperglycemia - POCT glycosylated hemoglobin (Hb A1C) - POCT UA - Microalbumin - CMP14+EGFR - metFORMIN (GLUCOPHAGE) 500 MG tablet; Take 1 tablet (500 mg total) by mouth daily.  Dispense: 90 tablet; Refill: 3  3. Hyperlipidemia -Pt ran out of Zocor a few months ago- Pt states he will restart medication - CMP14+EGFR - Lipid panel - simvastatin (ZOCOR) 20 MG tablet; Take 1 tablet (20 mg total) by mouth at bedtime.  Dispense: 90 tablet; Refill: 3  4. Herpes - CMP14+EGFR - valACYclovir (VALTREX) 500 MG tablet; Take 1 tablet (500 mg total) by mouth daily.  Dispense: 90 tablet; Refill: 3   Continue all meds Labs pending Health Maintenance reviewed Diet and exercise encouraged RTO 6 months  Evelina Dun, FNP

## 2014-07-25 NOTE — Addendum Note (Signed)
Addended by: Tommas OlpHANDY, Jermari Tamargo N on: 07/25/2014 05:22 PM   Modules accepted: Orders

## 2014-07-26 LAB — CMP14+EGFR
A/G RATIO: 1.6 (ref 1.1–2.5)
ALBUMIN: 4.4 g/dL (ref 3.5–5.5)
ALT: 22 IU/L (ref 0–44)
AST: 17 IU/L (ref 0–40)
Alkaline Phosphatase: 63 IU/L (ref 39–117)
BUN/Creatinine Ratio: 11 (ref 9–20)
BUN: 10 mg/dL (ref 6–24)
Bilirubin Total: 0.3 mg/dL (ref 0.0–1.2)
CALCIUM: 9.6 mg/dL (ref 8.7–10.2)
CO2: 25 mmol/L (ref 18–29)
Chloride: 98 mmol/L (ref 97–108)
Creatinine, Ser: 0.94 mg/dL (ref 0.76–1.27)
GFR calc Af Amer: 104 mL/min/{1.73_m2} (ref >59)
GFR calc non Af Amer: 90 mL/min/{1.73_m2} (ref >59)
Globulin, Total: 2.8 g/dL (ref 1.5–4.5)
Glucose: 114 mg/dL — ABNORMAL HIGH (ref 65–99)
Potassium: 4.6 mmol/L (ref 3.5–5.2)
SODIUM: 138 mmol/L (ref 134–144)
TOTAL PROTEIN: 7.2 g/dL (ref 6.0–8.5)

## 2014-07-26 LAB — LIPID PANEL
CHOL/HDL RATIO: 3.7 ratio (ref 0.0–5.0)
CHOLESTEROL TOTAL: 215 mg/dL — AB (ref 100–199)
HDL: 58 mg/dL (ref >39)
LDL CALC: 136 mg/dL — AB (ref 0–99)
Triglycerides: 107 mg/dL (ref 0–149)
VLDL Cholesterol Cal: 21 mg/dL (ref 5–40)

## 2014-07-26 LAB — MICROALBUMIN, URINE: Microalbumin, Urine: 18.3 ug/mL

## 2014-07-27 ENCOUNTER — Other Ambulatory Visit: Payer: Self-pay | Admitting: Family

## 2014-07-27 DIAGNOSIS — E1165 Type 2 diabetes mellitus with hyperglycemia: Secondary | ICD-10-CM

## 2014-07-27 MED ORDER — METFORMIN HCL 500 MG PO TABS: 500.0000 mg | ORAL_TABLET | Freq: Two times a day (BID) | ORAL | Status: DC

## 2014-07-27 MED ORDER — SIMVASTATIN 40 MG PO TABS: 40.0000 mg | ORAL_TABLET | Freq: Every day | ORAL | Status: DC

## 2014-08-21 ENCOUNTER — Other Ambulatory Visit: Payer: Self-pay | Admitting: Family

## 2014-09-11 ENCOUNTER — Ambulatory Visit (INDEPENDENT_AMBULATORY_CARE_PROVIDER_SITE_OTHER): Payer: BC Managed Care – PPO | Admitting: Family

## 2014-09-11 ENCOUNTER — Encounter: Payer: Self-pay | Admitting: Family

## 2014-09-11 VITALS — BP 132/76 | HR 66 | Ht 73.0 in | Wt 268.0 lb

## 2014-09-11 DIAGNOSIS — H10023 Other mucopurulent conjunctivitis, bilateral: Secondary | ICD-10-CM

## 2014-09-11 MED ORDER — POLYMYXIN B-TRIMETHOPRIM 10000-0.1 UNIT/ML-% OP SOLN: 2.0000 [drp] | Freq: Four times a day (QID) | OPHTHALMIC | Status: DC

## 2014-09-11 NOTE — Progress Notes (Signed)
   Subjective:    Patient ID: Tyler BaleShanbay Denherder, male    DOB: February 18, 1956, 59 y.o.   MRN: [id number]  Pt states he has a growth under his right eye. Pt states he noticed the growth "a couple days ago". Pt states he noticed both eyes are red last week. Pt states he feels like there is sand or dirt in them. Pt states he has tried multiple times to "flush" his eye out with water with no relief. Conjunctivitis  The current episode started more than 1 week ago. The problem occurs continuously. The problem has been gradually worsening. The problem is moderate. Relieved by: flushed with water. Associated symptoms include eye itching, eye pain and eye redness. Pertinent negatives include no fever, no decreased vision, no double vision, no photophobia, no ear pain, no headaches, no rhinorrhea, no sore throat and no eye discharge. The eye pain is mild. Both eyes are affected.      Review of Systems  Constitutional: Negative.  Negative for fever.  HENT: Negative.  Negative for ear pain, rhinorrhea and sore throat.   Eyes: Positive for pain, redness and itching. Negative for double vision, photophobia and discharge.  Respiratory: Negative.   Cardiovascular: Negative.   Gastrointestinal: Negative.   Endocrine: Negative.   Genitourinary: Negative.   Musculoskeletal: Negative.   Neurological: Negative.  Negative for headaches.  Hematological: Negative.   Psychiatric/Behavioral: Negative.   All other systems reviewed and are negative.      Objective:   Physical Exam  Constitutional: He is oriented to person, place, and time. He appears well-developed and well-nourished. No distress.  HENT:  Head: Normocephalic.  Right Ear: External ear normal.  Left Ear: External ear normal.  Mouth/Throat: Oropharynx is clear and moist.  Eyes: Pupils are equal, round, and reactive to light. Right eye exhibits discharge. Left eye exhibits discharge.  Neck: Normal range of motion. Neck supple. No thyromegaly present.    Cardiovascular: Normal rate, regular rhythm, normal heart sounds and intact distal pulses.   No murmur heard. Pulmonary/Chest: Effort normal and breath sounds normal. No respiratory distress. He has no wheezes.  Abdominal: Soft. Bowel sounds are normal. He exhibits no distension. There is no tenderness.  Musculoskeletal: Normal range of motion. He exhibits no edema or tenderness.  Neurological: He is alert and oriented to person, place, and time. He has normal reflexes. No cranial nerve deficit.  Skin: Skin is warm and dry. No rash noted. No erythema.  Small skin tag present under right eye   Psychiatric: He has a normal mood and affect. His behavior is normal. Judgment and thought content normal.  Vitals reviewed.     Blood pressure 132/76, pulse 66, height 6\' 1"  (1.854 m), weight 268 lb (121.564 kg). s    Assessment & Plan:  1. Acute bacterial conjunctivitis, bilateral -Good hand hygiene discussed -Do not share towels or wash clothes -RTO prn- Pt to make appt with Tiffany if he wants skin tag removed - trimethoprim-polymyxin b (POLYTRIM) ophthalmic solution; Place 2 drops into both eyes every 6 (six) hours.  Dispense: 10 mL; Refill: 0  Jannifer Rodneyhristy Jersi Mcmaster, FNP

## 2014-09-11 NOTE — Patient Instructions (Signed)

## 2014-09-20 ENCOUNTER — Ambulatory Visit: Payer: BC Managed Care – PPO | Admitting: Physician Assistant

## 2014-11-12 ENCOUNTER — Encounter: Payer: Self-pay | Admitting: Family Medicine

## 2014-11-12 ENCOUNTER — Ambulatory Visit (INDEPENDENT_AMBULATORY_CARE_PROVIDER_SITE_OTHER): Payer: BC Managed Care – PPO | Admitting: Family Medicine

## 2014-11-12 VITALS — BP 119/73 | HR 62 | Temp 98.2°F | Ht 73.0 in | Wt 262.0 lb

## 2014-11-12 DIAGNOSIS — L301 Dyshidrosis [pompholyx]: Secondary | ICD-10-CM

## 2014-11-12 MED ORDER — FLUOCINONIDE-E 0.05 % EX CREA: 1.0000 "application " | TOPICAL_CREAM | Freq: Two times a day (BID) | CUTANEOUS | Status: DC

## 2014-11-12 NOTE — Progress Notes (Signed)
Subjective:  Patient ID: Pj Zehner, male    DOB: [birth year]  Age: 59 y.o. MRN: [id number]  CC: Foot Pain   HPI Tyler Burnett presents for lesions on the left foot primarily some on the right. They started out as blisters but have been weeping and developing some hard dark crusts also. Somewhat pruritic. He says he's been outdoors barefoot. No definitely known exposures.  History Tyler Burnett has a past medical history of Hypertension and Diabetes mellitus without complication.   He has past surgical history that includes Fracture surgery and arm surgery (1980).   His family history includes Aneurysm in his mother; Heart disease in his father.He reports that he has never smoked. He has never used smokeless tobacco. He reports that he does not drink alcohol or use illicit drugs.  Current Outpatient Prescriptions on File Prior to Visit  Medication Sig Dispense Refill  . lisinopril (PRINIVIL,ZESTRIL) 40 MG tablet Take 1 tablet (40 mg total) by mouth daily. 90 tablet 3  . metFORMIN (GLUCOPHAGE) 500 MG tablet Take 1 tablet (500 mg total) by mouth 2 (two) times daily with a meal. 180 tablet 3  . simvastatin (ZOCOR) 40 MG tablet Take 1 tablet (40 mg total) by mouth at bedtime. 90 tablet 3  . valACYclovir (VALTREX) 500 MG tablet Take 1 tablet (500 mg total) by mouth daily. 90 tablet 3   No current facility-administered medications on file prior to visit.    ROS Review of Systems  Objective:  BP 119/73 mmHg  Pulse 62  Temp(Src) 98.2 F (36.8 C) (Oral)  Ht  (1.854 m)  Wt 262 lb (118.842 kg)  BMI 34.57 kg/m2  Physical Exam  Constitutional: He appears well-developed and well-nourished.  HENT:  Head: Normocephalic and atraumatic.  Right Ear: External ear normal.  Left Ear: External ear normal.  Mouth/Throat: No oropharyngeal exudate or posterior oropharyngeal erythema.  Eyes: Pupils are equal, round, and reactive to light.  Neck: Normal range of motion. Neck supple.    Cardiovascular: Normal rate and regular rhythm.   No murmur heard. Pulmonary/Chest: Breath sounds normal. No respiratory distress.  Abdominal: Bowel sounds are normal.  Skin: Rash (papular eruption on the instep and soles of the feet. These are vesicular but some have dried and formed plaques. They are too numerous to count across the left foot scant on the right.) noted.  Vitals reviewed.   Assessment & Plan:   Tyler Burnett was seen today for foot pain.  Diagnoses and all orders for this visit:  Vesicular eczema of hands and feet  Other orders -     fluocinonide-emollient (LIDEX-E) 0.05 % cream; Apply 1 application topically 2 (two) times daily. To the affected areas of the feet  I have discontinued Tyler Burnett trimethoprim-polymyxin b. I am also having him start on fluocinonide-emollient. Additionally, I am having him maintain his lisinopril, valACYclovir, simvastatin, and metFORMIN.  Meds ordered this encounter  Medications  . fluocinonide-emollient (LIDEX-E) 0.05 % cream    Sig: Apply 1 application topically 2 (two) times daily. To the affected areas of the feet    Dispense:  60 g    Refill:  2   Wear white socks. Change them several times a day. Avoid walking barefoot. Wash the feet with warm soapy water and rinse with clear water twice daily prior to applying the cream. If the cream does not resolve or dramatically improve these lesions within 2 weeks follow-up in the office.  Follow-up: No Follow-up on file.  Mechele Claude, M.D.

## 2014-11-12 NOTE — Patient Instructions (Signed)
Wear white socks. Change them several times a day. Avoid walking barefoot. Wash the feet with warm soapy water and rinse with clear water twice daily prior to applying the cream. If the cream does not resolve or dramatically improve these lesions within 2 weeks follow-up in the office.

## 2014-12-19 ENCOUNTER — Ambulatory Visit: Payer: BC Managed Care – PPO | Admitting: Family Medicine

## 2014-12-25 ENCOUNTER — Ambulatory Visit (INDEPENDENT_AMBULATORY_CARE_PROVIDER_SITE_OTHER): Payer: BC Managed Care – PPO | Admitting: Pediatrics

## 2014-12-25 ENCOUNTER — Encounter: Payer: Self-pay | Admitting: Pediatrics

## 2014-12-25 VITALS — BP 139/79 | HR 58 | Temp 98.3°F | Ht 73.0 in | Wt 265.6 lb

## 2014-12-25 DIAGNOSIS — L309 Dermatitis, unspecified: Secondary | ICD-10-CM | POA: Diagnosis not present

## 2014-12-25 MED ORDER — FLUOCINONIDE-E 0.05 % EX CREA: 1.0000 "application " | TOPICAL_CREAM | Freq: Two times a day (BID) | CUTANEOUS | Status: DC

## 2014-12-25 NOTE — Progress Notes (Signed)
    Subjective:    Patient ID: Tyler Burnett, male    DOB: 25-Jul-1955, 59 y.o.   MRN: [id number]  CC: follow up foot rash  HPI: Tyler Burnett is a 59 y.o. male presenting on 12/25/2014 for Rash  Seen 6 weeks ago for rash on bottom of feet. Given lidex cream for eczema treat. Has helped a lot. Still with some pain in parts of feet. No fevers. No numbness on feet. No discharge or bleeding.    Relevant past medical, surgical, family and social history reviewed and updated as indicated. Interim medical history since our last visit reviewed. Allergies and medications reviewed and updated.   ROS: Per HPI unless specifically indicated above  Past Medical History Patient Active Problem List   Diagnosis Date Noted  . Hyperlipidemia 07/25/2014  . Diabetes (HCC) 09/05/2013  . Essential hypertension, benign 09/05/2013    Current Outpatient Prescriptions  Medication Sig Dispense Refill  . fluocinonide-emollient (LIDEX-E) 0.05 % cream Apply 1 application topically 2 (two) times daily. To the affected areas of the feet 60 g 2  . lisinopril (PRINIVIL,ZESTRIL) 40 MG tablet Take 1 tablet (40 mg total) by mouth daily. 90 tablet 3  . metFORMIN (GLUCOPHAGE) 500 MG tablet Take 1 tablet (500 mg total) by mouth 2 (two) times daily with a meal. 180 tablet 3  . simvastatin (ZOCOR) 40 MG tablet Take 1 tablet (40 mg total) by mouth at bedtime. 90 tablet 3  . valACYclovir (VALTREX) 500 MG tablet Take 1 tablet (500 mg total) by mouth daily. 90 tablet 3   No current facility-administered medications for this visit.       Objective:    BP 139/79 mmHg  Pulse 58  Temp(Src) 98.3 F (36.8 C) (Oral)  Ht 6\' 1"  (1.854 m)  Wt 265 lb 9.6 oz (120.475 kg)  BMI 35.05 kg/m2  Wt Readings from Last 3 Encounters:  12/25/14 265 lb 9.6 oz (120.475 kg)  11/12/14 262 lb (118.842 kg)  09/11/14 268 lb (121.564 kg)    Gen: NAD, alert, cooperative with exam, NCAT EYES: EOMI, no scleral injection or icterus CV: DP  pulses 2+ b/l, WWP Resp:  normal WOB Neuro: Alert and oriented, sensation intact on feet.  Skin: b/l with lateral thickening of skin, some cracking of thickened skin plaques with thinner healthy skin below. No cracks in the epidermis. No bleeding, sores, lesions, or discharge.      Assessment & Plan:  Tyler Burnett was seen today for rash on feet, Treating for eczema, improving. Continue cream, refill given.   Diagnoses and all orders for this visit:  Eczema -     fluocinonide-emollient (LIDEX-E) 0.05 % cream; Apply 1 application topically 2 (two) times daily. To the affected areas of the feet    Follow up plan: As needed   Rex Krasarol Vincent, MD [facility] 12/25/2014, 8:11 AM

## 2015-01-08 ENCOUNTER — Ambulatory Visit (INDEPENDENT_AMBULATORY_CARE_PROVIDER_SITE_OTHER): Payer: BC Managed Care – PPO | Admitting: Family Medicine

## 2015-01-08 VITALS — BP 151/69 | HR 60 | Temp 98.6°F | Ht 73.0 in | Wt 262.0 lb

## 2015-01-08 DIAGNOSIS — R300 Dysuria: Secondary | ICD-10-CM

## 2015-01-08 DIAGNOSIS — N410 Acute prostatitis: Secondary | ICD-10-CM | POA: Diagnosis not present

## 2015-01-08 LAB — POCT UA - MICROSCOPIC ONLY
BACTERIA, U MICROSCOPIC: NEGATIVE
CASTS, UR, LPF, POC: NEGATIVE
CRYSTALS, UR, HPF, POC: NEGATIVE
RBC, urine, microscopic: NEGATIVE
YEAST UA: NEGATIVE

## 2015-01-08 LAB — POCT URINALYSIS DIPSTICK

## 2015-01-08 MED ORDER — DOXYCYCLINE HYCLATE 100 MG PO TABS: 100.0000 mg | ORAL_TABLET | Freq: Two times a day (BID) | ORAL | Status: DC

## 2015-01-08 NOTE — Progress Notes (Signed)
Subjective:  Patient ID: Tyler Burnett, male    DOB: Feb 20, 1956  Age: 59 y.o. MRN: [id number]  CC: Urinary Tract Infection   HPI Tyler Burnett presents for onset 4 days ago of lower abdominal pain in the suprapubic area. Dull ache. Some increase with ambulation. No dysuria. Had similar sx before that turned out to be urinary infection.  Tyler Burnett has a past medical Tyler of Hypertension and Diabetes mellitus without complication (HCC).   Tyler Burnett has past surgical Tyler that includes Fracture surgery and arm surgery (1980).   His family Tyler includes Aneurysm in his mother; Heart disease in his father.Tyler Burnett reports that Tyler Burnett has never smoked. Tyler Burnett has never used smokeless tobacco. Tyler Burnett reports that Tyler Burnett does not drink alcohol or use illicit drugs.  Current Outpatient Prescriptions on File Prior to Visit  Medication Sig Dispense Refill  . fluocinonide-emollient (LIDEX-E) 0.05 % cream Apply 1 application topically 2 (two) times daily. To the affected areas of the feet 60 g 2  . lisinopril (PRINIVIL,ZESTRIL) 40 MG tablet Take 1 tablet (40 mg total) by mouth daily. 90 tablet 3  . metFORMIN (GLUCOPHAGE) 500 MG tablet Take 1 tablet (500 mg total) by mouth 2 (two) times daily with a meal. 180 tablet 3  . simvastatin (ZOCOR) 40 MG tablet Take 1 tablet (40 mg total) by mouth at bedtime. 90 tablet 3  . valACYclovir (VALTREX) 500 MG tablet Take 1 tablet (500 mg total) by mouth daily. 90 tablet 3   No current facility-administered medications on file prior to visit.    ROS Review of Systems  Constitutional: Positive for activity change (decreased). Negative for chills, diaphoresis and fatigue.  HENT: Negative for congestion and rhinorrhea.   Respiratory: Negative for cough and shortness of breath.   Cardiovascular: Negative for chest pain and palpitations.  Gastrointestinal: Positive for abdominal pain. Negative for nausea, vomiting, diarrhea and constipation.  Genitourinary: Negative for dysuria  and frequency.  Musculoskeletal: Negative for myalgias and arthralgias.  Skin: Negative for rash and wound.  All other systems reviewed and are negative.   Objective:  BP 151/69 mmHg  Pulse 60  Temp(Src) 98.6 F (37 C) (Oral)  Ht  (1.854 m)  Wt 262 lb (118.842 kg)  BMI 34.57 kg/m2  SpO2 96%  Physical Exam  Constitutional: Tyler Burnett appears well-developed and well-nourished.  HENT:  Head: Normocephalic and atraumatic.  Right Ear: Tympanic membrane and external ear normal. No decreased hearing is noted.  Left Ear: Tympanic membrane and external ear normal. No decreased hearing is noted.  Mouth/Throat: No oropharyngeal exudate or posterior oropharyngeal erythema.  Eyes: Pupils are equal, round, and reactive to light.  Neck: Normal range of motion. Neck supple.  Cardiovascular: Normal rate and regular rhythm.   No murmur heard. Pulmonary/Chest: Breath sounds normal. No respiratory distress.  Abdominal: Soft. Bowel sounds are normal. Tyler Burnett exhibits no mass. There is no tenderness.  Vitals reviewed.  Results for orders placed or performed in visit on 01/08/15  Urine culture  Result Value Ref Range   Urine Culture, Routine Final report    Urine Culture result 1 Comment   POCT urinalysis dipstick  Result Value Ref Range   Color, UA     Clarity, UA     Glucose, UA     Bilirubin, UA     Ketones, UA     Spec Grav, UA     Blood, UA     pH, UA     Protein, UA  Urobilinogen, UA     Nitrite, UA     Leukocytes, UA  Negative  POCT UA - Microscopic Only  Result Value Ref Range   WBC, Ur, HPF, POC OCC    RBC, urine, microscopic NEG    Bacteria, U Microscopic NEG    Mucus, UA MOD    Epithelial cells, urine per micros OCC    Crystals, Ur, HPF, POC NEG    Casts, Ur, LPF, POC NEG    Yeast, UA NEG      Assessment & Plan:   Tyler Burnett was seen today for urinary tract infection.  Diagnoses and all orders for this visit:  Dysuria -     POCT urinalysis dipstick -     POCT UA -  Microscopic Only -     Urine culture  Acute prostatitis  Other orders -     Discontinue: doxycycline (VIBRA-TABS) 100 MG tablet; Take 1 tablet (100 mg total) by mouth 2 (two) times daily.   I am having Tyler Burnett maintain his lisinopril, valACYclovir, simvastatin, metFORMIN, and fluocinonide-emollient.  Meds ordered this encounter  Medications  . DISCONTD: doxycycline (VIBRA-TABS) 100 MG tablet    Sig: Take 1 tablet (100 mg total) by mouth 2 (two) times daily.    Dispense:  20 tablet    Refill:  0     Follow-up: Return if symptoms worsen or fail to improve, for Keep appt of 11/28 for physical.  Mechele ClaudeWarren Artemisa Sladek, M.D.

## 2015-01-10 ENCOUNTER — Telehealth: Payer: Self-pay | Admitting: Family Medicine

## 2015-01-10 DIAGNOSIS — R399 Unspecified symptoms and signs involving the genitourinary system: Secondary | ICD-10-CM

## 2015-01-10 LAB — URINE CULTURE

## 2015-01-10 MED ORDER — SULFAMETHOXAZOLE-TRIMETHOPRIM 800-160 MG PO TABS: 1.0000 | ORAL_TABLET | Freq: Two times a day (BID) | ORAL | Status: AC

## 2015-01-10 NOTE — Telephone Encounter (Signed)
Pt aware.

## 2015-01-10 NOTE — Telephone Encounter (Signed)
Please let pt know--I sent bactrim DS into [facility]. Take twice a day for 7 days. He should stop the doxycycline.

## 2015-01-10 NOTE — Telephone Encounter (Signed)
See below

## 2015-01-10 NOTE — Telephone Encounter (Signed)
Stp and he states within minutes of taking doxy he is doubled over in pain and feels bloated, he has tried taking it with and without food. Can we change it to something else? Please advise

## 2015-01-28 ENCOUNTER — Ambulatory Visit (INDEPENDENT_AMBULATORY_CARE_PROVIDER_SITE_OTHER): Payer: BC Managed Care – PPO | Admitting: Family

## 2015-01-28 ENCOUNTER — Encounter: Payer: Self-pay | Admitting: Family

## 2015-01-28 VITALS — BP 135/78 | HR 56 | Temp 97.6°F | Resp 16 | Ht 73.0 in | Wt 262.0 lb

## 2015-01-28 DIAGNOSIS — A6002 Herpesviral infection of other male genital organs: Secondary | ICD-10-CM | POA: Insufficient documentation

## 2015-01-28 DIAGNOSIS — E1165 Type 2 diabetes mellitus with hyperglycemia: Secondary | ICD-10-CM | POA: Diagnosis not present

## 2015-01-28 DIAGNOSIS — Z87438 Personal history of other diseases of male genital organs: Secondary | ICD-10-CM | POA: Diagnosis not present

## 2015-01-28 DIAGNOSIS — I10 Essential (primary) hypertension: Secondary | ICD-10-CM | POA: Diagnosis not present

## 2015-01-28 DIAGNOSIS — A609 Anogenital herpesviral infection, unspecified: Secondary | ICD-10-CM

## 2015-01-28 DIAGNOSIS — Z1159 Encounter for screening for other viral diseases: Secondary | ICD-10-CM

## 2015-01-28 DIAGNOSIS — E785 Hyperlipidemia, unspecified: Secondary | ICD-10-CM

## 2015-01-28 LAB — POCT URINALYSIS DIPSTICK
BILIRUBIN UA: NEGATIVE
GLUCOSE UA: NEGATIVE
Ketones, UA: NEGATIVE
Leukocytes, UA: NEGATIVE
Nitrite, UA: NEGATIVE
Protein, UA: NEGATIVE
RBC UA: NEGATIVE
SPEC GRAV UA: 1.01
UROBILINOGEN UA: NEGATIVE
pH, UA: 6.5

## 2015-01-28 LAB — POCT UA - MICROSCOPIC ONLY
Bacteria, U Microscopic: NEGATIVE
CRYSTALS, UR, HPF, POC: NEGATIVE
Casts, Ur, LPF, POC: NEGATIVE
RBC, urine, microscopic: NEGATIVE
WBC, Ur, HPF, POC: NEGATIVE
YEAST UA: NEGATIVE

## 2015-01-28 LAB — POCT GLYCOSYLATED HEMOGLOBIN (HGB A1C): HEMOGLOBIN A1C: 7.4

## 2015-01-28 NOTE — Progress Notes (Signed)
Subjective:    Patient ID: Tyler Burnett, male    DOB: October 04, 1955, 59 y.o.   MRN: [id number]  Pt presents to the office today for chronic follow up.  Diabetes He presents for his follow-up diabetic visit. He has type 2 diabetes mellitus. There are no hypoglycemic associated symptoms. Pertinent negatives for hypoglycemia include no confusion or headaches. Pertinent negatives for diabetes include no blurred vision, no foot paresthesias, no foot ulcerations and no visual change. There are no hypoglycemic complications. Pertinent negatives for hypoglycemia complications include no blackouts and no hospitalization. Symptoms are stable. Pertinent negatives for diabetic complications include no CVA, heart disease, nephropathy or peripheral neuropathy. Risk factors for coronary artery disease include dyslipidemia, hypertension, male sex, family history and stress. Current diabetic treatment includes oral agent (monotherapy). He is compliant with treatment all of the time. He is following a generally healthy diet. (Pt states he does not take BS ) An ACE inhibitor/angiotensin II receptor blocker is being taken. Eye exam is not current.  Hypertension This is a chronic problem. The current episode started more than 1 year ago. The problem has been resolved since onset. The problem is controlled. Pertinent negatives include no blurred vision, headaches, palpitations, peripheral edema or shortness of breath. Risk factors for coronary artery disease include dyslipidemia, family history, male gender and sedentary lifestyle. Past treatments include ACE inhibitors. The current treatment provides significant improvement. There is no history of kidney disease, CAD/MI, CVA, heart failure or a thyroid problem. There is no history of sleep apnea.  Hyperlipidemia This is a new problem. The current episode started more than 1 year ago. The problem is uncontrolled. Recent lipid tests were reviewed and are high. Exacerbating  diseases include diabetes. Factors aggravating his hyperlipidemia include fatty foods. Pertinent negatives include no shortness of breath. Current antihyperlipidemic treatment includes diet change (Pt has been out of Zocor for "Months"). Risk factors for coronary artery disease include dyslipidemia, family history, diabetes mellitus, hypertension and male sex.  Herpes Pt states he takes his Valtrex daily and states it has been over a year since he has an outbreak.    Review of Systems  Constitutional: Negative.   HENT: Negative.   Eyes: Negative for blurred vision.  Respiratory: Negative.  Negative for shortness of breath.   Cardiovascular: Negative.  Negative for palpitations.  Gastrointestinal: Negative.   Endocrine: Negative.   Genitourinary: Negative.   Musculoskeletal: Negative.   Neurological: Negative.  Negative for headaches.  Hematological: Negative.   Psychiatric/Behavioral: Negative.  Negative for confusion.  All other systems reviewed and are negative.      Objective:   Physical Exam  Constitutional: He is oriented to person, place, and time. He appears well-developed and well-nourished. No distress.  HENT:  Head: Normocephalic.  Right Ear: External ear normal.  Left Ear: External ear normal.  Nose: Nose normal.  Mouth/Throat: Oropharynx is clear and moist.  Eyes: Pupils are equal, round, and reactive to light. Right eye exhibits no discharge. Left eye exhibits no discharge.  Neck: Normal range of motion. Neck supple. No thyromegaly present.  Cardiovascular: Normal rate, regular rhythm, normal heart sounds and intact distal pulses.   No murmur heard. Pulmonary/Chest: Effort normal and breath sounds normal. No respiratory distress. He has no wheezes.  Abdominal: Soft. Bowel sounds are normal. He exhibits no distension. There is no tenderness.  Musculoskeletal: Normal range of motion. He exhibits no edema or tenderness.  Neurological: He is alert and oriented to  person, place, and time. He has  normal reflexes. No cranial nerve deficit.  Skin: Skin is warm and dry. No rash noted. No erythema.  Psychiatric: He has a normal mood and affect. His behavior is normal. Judgment and thought content normal.  Vitals reviewed.     BP 135/78 mmHg  Pulse 56  Temp(Src) 97.6 F (36.4 C) (Oral)  Resp 16  Ht _0  (1.854 m)  Wt 262 lb (118.842 kg)  BMI 34.57 kg/m2     Assessment & Plan:  1. Essential hypertension, benign - CMP14+EGFR  2. Type 2 diabetes mellitus with hyperglycemia, without long-term current use of insulin (HCC) - POCT glycosylated hemoglobin (Hb A1C) - CMP14+EGFR - POCT UA - Microalbumin  3. Herpes genitalis in men - CMP14+EGFR  4. Hyperlipidemia - Lipid panel - CMP14+EGFR  5. History of prostatitis - CMP14+EGFR - POCT urinalysis dipstick - POCT UA - Microscopic Only  6. Need for hepatitis C screening test - Hepatitis C antibody   Continue all meds Labs pending Health Maintenance reviewed Diet and exercise encouraged RTO 6 months  Evelina Dun, FNP

## 2015-01-28 NOTE — Patient Instructions (Signed)

## 2015-01-28 NOTE — Addendum Note (Signed)
Addended by: Orma RenderHODGES, Francely Craw F on: 01/28/2015 05:11 PM   Modules accepted: Orders

## 2015-01-29 LAB — MICROALBUMIN, URINE: Microalbumin, Urine: 13.2 ug/mL

## 2015-01-29 LAB — CMP14+EGFR
A/G RATIO: 1.8 (ref 1.1–2.5)
ALBUMIN: 4.4 g/dL (ref 3.5–5.5)
ALT: 20 IU/L (ref 0–44)
AST: 14 IU/L (ref 0–40)
Alkaline Phosphatase: 61 IU/L (ref 39–117)
BUN/Creatinine Ratio: 13 (ref 9–20)
BUN: 11 mg/dL (ref 6–24)
Bilirubin Total: 0.3 mg/dL (ref 0.0–1.2)
CALCIUM: 9.3 mg/dL (ref 8.7–10.2)
CO2: 27 mmol/L (ref 18–29)
Chloride: 98 mmol/L (ref 97–106)
Creatinine, Ser: 0.86 mg/dL (ref 0.76–1.27)
GFR, EST AFRICAN AMERICAN: 110 mL/min/{1.73_m2} (ref >59)
GFR, EST NON AFRICAN AMERICAN: 96 mL/min/{1.73_m2} (ref >59)
GLOBULIN, TOTAL: 2.5 g/dL (ref 1.5–4.5)
Glucose: 135 mg/dL — ABNORMAL HIGH (ref 65–99)
POTASSIUM: 4.4 mmol/L (ref 3.5–5.2)
SODIUM: 139 mmol/L (ref 136–144)
TOTAL PROTEIN: 6.9 g/dL (ref 6.0–8.5)

## 2015-01-29 LAB — LIPID PANEL
Chol/HDL Ratio: 3.5 ratio units (ref 0.0–5.0)
Cholesterol, Total: 160 mg/dL (ref 100–199)
HDL: 46 mg/dL (ref >39)
LDL CALC: 87 mg/dL (ref 0–99)
TRIGLYCERIDES: 133 mg/dL (ref 0–149)
VLDL CHOLESTEROL CAL: 27 mg/dL (ref 5–40)

## 2015-01-29 LAB — HEPATITIS C ANTIBODY: Hep C Virus Ab: <0.1 s/co ratio (ref 0.0–0.9)

## 2015-01-30 NOTE — Progress Notes (Signed)
Patient aware.

## 2015-02-07 ENCOUNTER — Ambulatory Visit: Payer: BC Managed Care – PPO | Admitting: Family Medicine

## 2015-02-08 ENCOUNTER — Ambulatory Visit: Payer: BC Managed Care – PPO | Admitting: Family Medicine

## 2015-03-01 ENCOUNTER — Encounter: Payer: Self-pay | Admitting: Family Medicine

## 2015-03-01 ENCOUNTER — Ambulatory Visit (INDEPENDENT_AMBULATORY_CARE_PROVIDER_SITE_OTHER): Payer: BC Managed Care – PPO | Admitting: Family Medicine

## 2015-03-01 VITALS — BP 121/78 | HR 65 | Temp 98.5°F | Ht 73.0 in | Wt 258.8 lb

## 2015-03-01 DIAGNOSIS — R1030 Lower abdominal pain, unspecified: Secondary | ICD-10-CM

## 2015-03-01 MED ORDER — METRONIDAZOLE 500 MG PO TABS: 500.0000 mg | ORAL_TABLET | Freq: Two times a day (BID) | ORAL | Status: DC

## 2015-03-01 MED ORDER — CIPROFLOXACIN HCL 500 MG PO TABS: 500.0000 mg | ORAL_TABLET | Freq: Two times a day (BID) | ORAL | Status: DC

## 2015-03-01 NOTE — Progress Notes (Signed)
BP 121/78 mmHg  Pulse 65  Temp(Src) 98.5 F (36.9 C) (Oral)  Ht _0  (1.854 m)  Wt 258 lb 12.8 oz (117.391 kg)  BMI 34.15 kg/m2   Subjective:    Patient ID: Tyler Burnett, male    DOB: 03-28-1955, 59 y.o.   MRN: [id number]  HPI: Tyler Burnett is a 59 y.o. male presenting on 03/01/2015 for Abdominal Pain; Groin Pain; and Weight Loss   HPI Lower abdominal pain and weight loss Patient has been having lower abdominal pain down low in near his groin for the past 2 months. 2 months ago he was seen and treated for dysuria with an antibiotic. The pain improved slightly but then returned and has worsened. The pain is described as a sharp and crampy pain medicine by lateral lower abdominal quadrants. He has had some constipation, denies any diarrhea. He denies any blood in the stool. He thinks he may have had some dysuria and urinary frequency. He does have an umbilical hernia that has been there but he does not have any pain around the umbilical hernia.  Relevant past medical, surgical, family and social history reviewed and updated as indicated. Interim medical history since our last visit reviewed. Allergies and medications reviewed and updated.  Review of Systems  Constitutional: Negative for fever.  HENT: Negative for ear discharge and ear pain.   Eyes: Negative for discharge and visual disturbance.  Respiratory: Negative for shortness of breath and wheezing.   Cardiovascular: Negative for chest pain and leg swelling.  Gastrointestinal: Positive for abdominal pain and constipation. Negative for nausea, vomiting, diarrhea, blood in stool and abdominal distention.  Genitourinary: Positive for dysuria and frequency. Negative for urgency, hematuria, flank pain, decreased urine volume, discharge, scrotal swelling and difficulty urinating.  Musculoskeletal: Negative for back pain and gait problem.  Skin: Negative for rash.  Neurological: Negative for syncope, light-headedness and headaches.    All other systems reviewed and are negative.   Per HPI unless specifically indicated above     Medication List       This list is accurate as of: 03/01/15  5:03 PM.  Always use your most recent med list.               ciprofloxacin 500 MG tablet  Commonly known as:  CIPRO  Take 1 tablet (500 mg total) by mouth 2 (two) times daily.     lisinopril 40 MG tablet  Commonly known as:  PRINIVIL,ZESTRIL  Take 1 tablet (40 mg total) by mouth daily.     metFORMIN 500 MG tablet  Commonly known as:  GLUCOPHAGE  Take 1 tablet (500 mg total) by mouth 2 (two) times daily with a meal.     metroNIDAZOLE 500 MG tablet  Commonly known as:  FLAGYL  Take 1 tablet (500 mg total) by mouth 2 (two) times daily.     simvastatin 40 MG tablet  Commonly known as:  ZOCOR  Take 1 tablet (40 mg total) by mouth at bedtime.     valACYclovir 500 MG tablet  Commonly known as:  VALTREX  Take 1 tablet (500 mg total) by mouth daily.           Objective:    BP 121/78 mmHg  Pulse 65  Temp(Src) 98.5 F (36.9 C) (Oral)  Ht _1  (1.854 m)  Wt 258 lb 12.8 oz (117.391 kg)  BMI 34.15 kg/m2  Wt Readings from Last 3 Encounters:  03/01/15 258 lb 12.8 oz (117.391 kg)  01/28/15 262 lb (118.842 kg)  01/08/15 262 lb (118.842 kg)    Physical Exam  Constitutional: He is oriented to person, place, and time. He appears well-developed and well-nourished. No distress.  Eyes: Conjunctivae and EOM are normal. Pupils are equal, round, and reactive to light. Right eye exhibits no discharge. No scleral icterus.  Cardiovascular: Normal rate, regular rhythm, normal heart sounds and intact distal pulses.   No murmur heard. Pulmonary/Chest: Effort normal and breath sounds normal. No respiratory distress. He has no wheezes.  Abdominal: Soft. Bowel sounds are normal. He exhibits no distension. There is tenderness in the right lower quadrant and left lower quadrant. There is no rigidity, no rebound, no guarding, no CVA  tenderness, no tenderness at McBurney's point and negative Murphy's sign. A hernia (umbilical) is present.  Musculoskeletal: Normal range of motion. He exhibits no edema.  Neurological: He is alert and oriented to person, place, and time. Coordination normal.  Skin: Skin is warm and dry. No rash noted. He is not diaphoretic.  Psychiatric: He has a normal mood and affect. His behavior is normal.  Vitals reviewed.   Results for orders placed or performed in visit on 01/28/15  Lipid panel  Result Value Ref Range   Cholesterol, Total 160 100 - 199 mg/dL   Triglycerides 133 0 - 149 mg/dL   HDL 46 >39 mg/dL   VLDL Cholesterol Cal 27 5 - 40 mg/dL   LDL Calculated 87 0 - 99 mg/dL   Chol/HDL Ratio 3.5 0.0 - 5.0 ratio units  CMP14+EGFR  Result Value Ref Range   Glucose 135 (H) 65 - 99 mg/dL   BUN 11 6 - 24 mg/dL   Creatinine, Ser 0.86 0.76 - 1.27 mg/dL   GFR calc non Af Amer 96 >59 mL/min/1.73   GFR calc Af Amer 110 >59 mL/min/1.73   BUN/Creatinine Ratio 13 9 - 20   Sodium 139 136 - 144 mmol/L   Potassium 4.4 3.5 - 5.2 mmol/L   Chloride 98 97 - 106 mmol/L   CO2 27 18 - 29 mmol/L   Calcium 9.3 8.7 - 10.2 mg/dL   Total Protein 6.9 6.0 - 8.5 g/dL   Albumin 4.4 3.5 - 5.5 g/dL   Globulin, Total 2.5 1.5 - 4.5 g/dL   Albumin/Globulin Ratio 1.8 1.1 - 2.5   Bilirubin Total 0.3 0.0 - 1.2 mg/dL   Alkaline Phosphatase 61 39 - 117 IU/L   AST 14 0 - 40 IU/L   ALT 20 0 - 44 IU/L  Hepatitis C antibody  Result Value Ref Range   Hep C Virus Ab <0.1 0.0 - 0.9 s/co ratio  Microalbumin, urine  Result Value Ref Range   Microalbum.,U,Random 13.2 Not Estab. ug/mL  POCT glycosylated hemoglobin (Hb A1C)  Result Value Ref Range   Hemoglobin A1C 7.4   POCT urinalysis dipstick  Result Value Ref Range   Color, UA yellow    Clarity, UA clear    Glucose, UA negative    Bilirubin, UA negative    Ketones, UA negative    Spec Grav, UA 1.010    Blood, UA negative    pH, UA 6.5    Protein, UA negative     Urobilinogen, UA negative    Nitrite, UA negative    Leukocytes, UA Negative Negative  POCT UA - Microscopic Only  Result Value Ref Range   WBC, Ur, HPF, POC negative    RBC, urine, microscopic negative    Bacteria, U Microscopic negative  Mucus, UA occ    Epithelial cells, urine per micros occ    Crystals, Ur, HPF, POC negative    Casts, Ur, LPF, POC 'negative    Yeast, UA negative       Assessment & Plan:   Problem List Items Addressed This Visit    None    Visit Diagnoses    Lower abdominal pain    -  Primary    We will treat like colitis and do CT scan of the abdomen    Relevant Medications    metroNIDAZOLE (FLAGYL) 500 MG tablet    ciprofloxacin (CIPRO) 500 MG tablet    Other Relevant Orders    CT Abdomen Pelvis W Contrast       Follow up plan: Return in about 2 weeks (around 03/15/2015), or if symptoms worsen or fail to improve, for f/u abd pain.  Counseling provided for all of the vaccine components Orders Placed This Encounter  Procedures  . CT Abdomen Pelvis W Contrast    Caryl Pina, MD [facility] 03/01/2015, 5:03 PM

## 2015-03-08 ENCOUNTER — Other Ambulatory Visit (HOSPITAL_COMMUNITY): Payer: Self-pay

## 2015-03-11 ENCOUNTER — Other Ambulatory Visit (HOSPITAL_COMMUNITY): Payer: Self-pay

## 2015-03-11 ENCOUNTER — Ambulatory Visit (HOSPITAL_COMMUNITY)
Admission: RE | Admit: 2015-03-11 | Discharge: 2015-03-11 | Disposition: A | Discharge status: Home / Self Care | Payer: BC Managed Care – PPO | Source: Ambulatory Visit | Attending: Family Medicine | Admitting: Family Medicine

## 2015-03-11 DIAGNOSIS — R110 Nausea: Secondary | ICD-10-CM | POA: Insufficient documentation

## 2015-03-11 DIAGNOSIS — I10 Essential (primary) hypertension: Secondary | ICD-10-CM | POA: Diagnosis not present

## 2015-03-11 DIAGNOSIS — R1030 Lower abdominal pain, unspecified: Secondary | ICD-10-CM | POA: Diagnosis not present

## 2015-03-11 DIAGNOSIS — E119 Type 2 diabetes mellitus without complications: Secondary | ICD-10-CM | POA: Insufficient documentation

## 2015-03-11 LAB — POCT I-STAT CREATININE: Creatinine, Ser: 1 mg/dL (ref 0.61–1.24)

## 2015-03-11 MED ORDER — IOHEXOL 300 MG/ML  SOLN
100.0000 mL | Freq: Once | INTRAMUSCULAR | Status: AC | PRN
  Administered 2015-03-11: 100 mL via INTRAVENOUS

## 2015-03-18 ENCOUNTER — Encounter: Payer: Self-pay | Admitting: Family Medicine

## 2015-03-18 ENCOUNTER — Ambulatory Visit (INDEPENDENT_AMBULATORY_CARE_PROVIDER_SITE_OTHER): Payer: BC Managed Care – PPO | Admitting: Family Medicine

## 2015-03-18 VITALS — BP 140/88 | HR 65 | Temp 98.1°F | Ht 73.0 in | Wt 261.6 lb

## 2015-03-18 DIAGNOSIS — N410 Acute prostatitis: Secondary | ICD-10-CM

## 2015-03-18 DIAGNOSIS — R1030 Lower abdominal pain, unspecified: Secondary | ICD-10-CM

## 2015-03-18 MED ORDER — CIPROFLOXACIN HCL 500 MG PO TABS: 500.0000 mg | ORAL_TABLET | Freq: Two times a day (BID) | ORAL | Status: DC

## 2015-03-18 NOTE — Progress Notes (Signed)
BP 140/88 mmHg  Pulse 65  Temp(Src) 98.1 F (36.7 C) (Oral)  Ht 6\' 1"  (1.854 m)  Wt 261 lb 9.6 oz (118.661 kg)  BMI 34.52 kg/m2   Subjective:    Patient ID: Tyler Burnett, male    DOB: 04-14-1955, 60 y.o.   MRN: [id number]  HPI: Tyler Burnett is a 60 y.o. male presenting on 03/18/2015 for Abdominal Pain and Groin Pain   HPI Abdominal pain Patient has been having lower abdominal pain goes down into the inguinal region bilaterally. He finished a course of ciprofloxacin and Flagyl that we gave him and felt like it completely resolved for a couple days and then came back stronger. He denies any constipation or diarrhea. He denies any pain with intercourse. He denies any fevers or chills. He denies any hematuria or hematochezia. He denies any nausea vomiting. He denies any pain radiating around to his back or anywhere else. The pain is worse when he is standing up and better when he lays down.  Relevant past medical, surgical, family and social history reviewed and updated as indicated. Interim medical history since our last visit reviewed. Allergies and medications reviewed and updated.  Review of Systems  Constitutional: Negative for fever.  HENT: Negative for ear discharge and ear pain.   Eyes: Negative for discharge and visual disturbance.  Respiratory: Negative for shortness of breath and wheezing.   Cardiovascular: Negative for chest pain and leg swelling.  Gastrointestinal: Positive for abdominal pain and constipation (Intermittent). Negative for nausea, vomiting, diarrhea, blood in stool and abdominal distention.  Genitourinary: Negative for dysuria, urgency, frequency, hematuria, flank pain, decreased urine volume, discharge, scrotal swelling and difficulty urinating.  Musculoskeletal: Negative for back pain and gait problem.  Skin: Negative for rash.  Neurological: Negative for syncope, light-headedness and headaches.  All other systems reviewed and are negative.   Per HPI  unless specifically indicated above     Medication List       This list is accurate as of: 03/18/15  8:42 AM.  Always use your most recent med list.               ciprofloxacin 500 MG tablet  Commonly known as:  CIPRO  Take 1 tablet (500 mg total) by mouth 2 (two) times daily.     lisinopril 40 MG tablet  Commonly known as:  PRINIVIL,ZESTRIL  Take 1 tablet (40 mg total) by mouth daily.     metFORMIN 500 MG tablet  Commonly known as:  GLUCOPHAGE  Take 1 tablet (500 mg total) by mouth 2 (two) times daily with a meal.     simvastatin 40 MG tablet  Commonly known as:  ZOCOR  Take 1 tablet (40 mg total) by mouth at bedtime.     valACYclovir 500 MG tablet  Commonly known as:  VALTREX  Take 1 tablet (500 mg total) by mouth daily.           Objective:    BP 140/88 mmHg  Pulse 65  Temp(Src) 98.1 F (36.7 C) (Oral)  Ht 6\' 1"  (1.854 m)  Wt 261 lb 9.6 oz (118.661 kg)  BMI 34.52 kg/m2  Wt Readings from Last 3 Encounters:  03/18/15 261 lb 9.6 oz (118.661 kg)  03/01/15 258 lb 12.8 oz (117.391 kg)  01/28/15 262 lb (118.842 kg)    Physical Exam  Constitutional: He is oriented to person, place, and time. He appears well-developed and well-nourished. No distress.  Eyes: Conjunctivae and EOM are normal. Pupils  are equal, round, and reactive to light. Right eye exhibits no discharge. No scleral icterus.  Cardiovascular: Normal rate, regular rhythm, normal heart sounds and intact distal pulses.   No murmur heard. Pulmonary/Chest: Effort normal and breath sounds normal. No respiratory distress. He has no wheezes.  Abdominal: Soft. Bowel sounds are normal. He exhibits no distension. There is tenderness (Pain is more felt on the left lower quadrant over the inguinal ligament region. No hernia palpated there or on the right inguinal region) in the right lower quadrant and left lower quadrant. There is no rigidity, no rebound, no guarding, no CVA tenderness, no tenderness at McBurney's  point and negative Murphy's sign. A hernia (umbilical) is present.  Musculoskeletal: Normal range of motion. He exhibits no edema.  Neurological: He is alert and oriented to person, place, and time. Coordination normal.  Skin: Skin is warm and dry. No rash noted. He is not diaphoretic.  Psychiatric: He has a normal mood and affect. His behavior is normal.  Vitals reviewed.   Results for orders placed or performed during the hospital encounter of 03/11/15  I-STAT creatinine  Result Value Ref Range   Creatinine, Ser 1.00 0.61 - 1.24 mg/dL      Assessment & Plan:   Problem List Items Addressed This Visit    None    Visit Diagnoses    Acute prostatitis    -  Primary    We'll treat like prostate inflammation, check PSA, refer to gastroenterology for repeat colonoscopy just in case it's IBS    Relevant Medications    ciprofloxacin (CIPRO) 500 MG tablet    Other Relevant Orders    PSA, total and free    Lower abdominal pain        Relevant Orders    Ambulatory referral to Gastroenterology        Follow up plan: Return if symptoms worsen or fail to improve.  Counseling provided for all of the vaccine components Orders Placed This Encounter  Procedures  . PSA, total and free  . Ambulatory referral to Gastroenterology    Arville CareJoshua Michaelann Gunnoe, MD [facility] 03/18/2015, 8:42 AM

## 2015-03-19 LAB — PSA, TOTAL AND FREE
PROSTATE SPECIFIC AG, SERUM: 0.5 ng/mL (ref 0.0–4.0)
PSA FREE PCT: 34 %
PSA, Free: 0.17 ng/mL

## 2015-04-29 ENCOUNTER — Telehealth: Payer: Self-pay | Admitting: Family

## 2015-04-29 MED ORDER — OSELTAMIVIR PHOSPHATE 75 MG PO CAPS: 75.0000 mg | ORAL_CAPSULE | Freq: Two times a day (BID) | ORAL | Status: DC

## 2015-04-29 NOTE — Telephone Encounter (Signed)
Pt's daughter was dx'd with the over the weekend, pt started running a fever of 103 yesterday and has body aches, chills and a cough. Tamiflu sent into pharmacy per Dr.Dettinger. Pt is aware.

## 2015-07-03 ENCOUNTER — Encounter: Payer: Self-pay | Admitting: Gastroenterology

## 2015-07-29 ENCOUNTER — Other Ambulatory Visit: Payer: Self-pay | Admitting: Family

## 2015-07-30 ENCOUNTER — Ambulatory Visit: Payer: BC Managed Care – PPO | Admitting: Family

## 2015-07-30 NOTE — Telephone Encounter (Signed)
Last seen 01/28/2015

## 2015-08-22 ENCOUNTER — Ambulatory Visit: Payer: BC Managed Care – PPO | Admitting: Family

## 2015-10-17 ENCOUNTER — Encounter: Payer: Self-pay | Admitting: Family Medicine

## 2015-10-17 ENCOUNTER — Ambulatory Visit (INDEPENDENT_AMBULATORY_CARE_PROVIDER_SITE_OTHER): Payer: BC Managed Care – PPO | Admitting: Family Medicine

## 2015-10-17 VITALS — BP 124/73 | HR 68 | Temp 98.1°F | Ht 73.0 in | Wt 250.2 lb

## 2015-10-17 DIAGNOSIS — J209 Acute bronchitis, unspecified: Secondary | ICD-10-CM | POA: Diagnosis not present

## 2015-10-17 MED ORDER — AZITHROMYCIN 250 MG PO TABS: ORAL_TABLET | ORAL | 0 refills | Status: DC

## 2015-10-17 NOTE — Patient Instructions (Signed)
Great to meet you!  Start the antibiotics and be sure to finish all pills  Start a plain claritin or zyrtec once daily (at least 10 days) Start flonase or nasocort 2 sprays per nostril once a day   Please come back if you get worse or do not get better.

## 2015-10-17 NOTE — Progress Notes (Signed)
   HPI  Patient presents today here with acute illness.  Patient explains that he's been sick for about 10 days. He complains of nasal congestion, deep raspy cough, sharp sore throat, intermittent shortness of breath, malaise, and chills.  He states that he is tolerating food and fluids normally, he has not tried any over-the-counter medications.  He states that the congestion, throat clearing, and cough got so bad over the last 2 days that it seems unbearable.   PMH: Smoking status noted ROS: Per HPI  Objective: BP 124/73   Pulse 68   Temp 98.1 F (36.7 C) (Oral)   Ht 6\' 1"  (1.854 m)   Wt 250 lb 4 oz (113.5 kg)   BMI 33.02 kg/m  Gen: NAD, alert, cooperative with exam HEENT: NCAT, nares with very so swollen turbinates bilaterally, TMs normal bilaterally-however several hairs touching the TMs, oropharynx clear, neck without any tenderness lymphadenopathy CV: RRR, good S1/S2, no murmur Resp: CTABL, no wheezes, non-labored Ext: No edema, warm Neuro: Alert and oriented, No gross deficits  Assessment and plan:  # Acute bronchitis Treat with azithromycin considering 10 days without improvement and worsening course. Also start aggressive allergic rhinitis treatment with daily antihistamine and nasal steroid. Return to clinic if not improving or worsening.   Meds ordered this encounter  Medications  . azithromycin (ZITHROMAX) 250 MG tablet    Sig: Take 2 tablets on day 1 and 1 tablet daily after that    Dispense:  6 tablet    Refill:  0    Murtis SinkSam Bradshaw, MD [facility] 10/17/2015, 8:36 AM

## 2015-10-25 ENCOUNTER — Other Ambulatory Visit: Payer: Self-pay | Admitting: Family

## 2015-11-02 ENCOUNTER — Telehealth: Payer: Self-pay | Admitting: Family Medicine

## 2015-11-02 ENCOUNTER — Ambulatory Visit: Payer: BC Managed Care – PPO

## 2015-11-05 ENCOUNTER — Other Ambulatory Visit: Payer: Self-pay | Admitting: *Deleted

## 2015-11-05 ENCOUNTER — Ambulatory Visit: Payer: BC Managed Care – PPO | Admitting: Family Medicine

## 2015-11-05 DIAGNOSIS — J312 Chronic pharyngitis: Secondary | ICD-10-CM

## 2015-11-05 NOTE — Telephone Encounter (Signed)
Please address

## 2015-11-05 NOTE — Telephone Encounter (Signed)
Please refer as requested 

## 2015-11-05 NOTE — Progress Notes (Signed)
Referral to ENT placed

## 2015-11-08 ENCOUNTER — Encounter: Payer: Self-pay | Admitting: Family Medicine

## 2015-11-08 ENCOUNTER — Ambulatory Visit (INDEPENDENT_AMBULATORY_CARE_PROVIDER_SITE_OTHER): Payer: BC Managed Care – PPO | Admitting: Family Medicine

## 2015-11-08 ENCOUNTER — Ambulatory Visit (INDEPENDENT_AMBULATORY_CARE_PROVIDER_SITE_OTHER): Payer: BC Managed Care – PPO

## 2015-11-08 VITALS — BP 116/69 | HR 62 | Temp 98.3°F | Ht 73.0 in | Wt 249.0 lb

## 2015-11-08 DIAGNOSIS — R05 Cough: Secondary | ICD-10-CM

## 2015-11-08 DIAGNOSIS — I10 Essential (primary) hypertension: Secondary | ICD-10-CM | POA: Diagnosis not present

## 2015-11-08 DIAGNOSIS — R059 Cough, unspecified: Secondary | ICD-10-CM

## 2015-11-08 DIAGNOSIS — J984 Other disorders of lung: Secondary | ICD-10-CM | POA: Diagnosis not present

## 2015-11-08 MED ORDER — BETAMETHASONE SOD PHOS & ACET 6 (3-3) MG/ML IJ SUSP
6.0000 mg | Freq: Once | INTRAMUSCULAR | Status: AC
  Administered 2015-11-08: 6 mg via INTRAMUSCULAR

## 2015-11-08 MED ORDER — LEVOFLOXACIN 500 MG PO TABS: 500.0000 mg | ORAL_TABLET | Freq: Every day | ORAL | 0 refills | Status: DC

## 2015-11-08 MED ORDER — AMLODIPINE BESYLATE 5 MG PO TABS: 5.0000 mg | ORAL_TABLET | Freq: Every day | ORAL | 5 refills | Status: DC

## 2015-11-08 NOTE — Progress Notes (Addendum)
Subjective:  Patient ID: Tyler Burnett, male    DOB: May 14, 1955  Age: 60 y.o. MRN: [id number]  CC: URI (pt here today because he isn't feeling any better from 8/17)   HPI Tyler Burnett presents for Ongoing cough treated first with some antibiotics. It just has not improved in spite of anything is done. He's had cough syrup to set antibiotics and still the cough continues. It is nonproductive. He has no other symptoms such as fever chills sweats. There is no upper respiratory congestion. There is no chest pain there is no dyspnea. But the cough is fairly consistent during the day and keeps him awake at night   History Tyler Burnett has a past medical history of Diabetes mellitus without complication (Tyler Burnett) and Hypertension.   He has a past surgical history that includes Fracture surgery and arm surgery (1980).   His family history includes Tyler Burnett in his mother; Tyler Burnett in his father.He reports that he has never smoked. He has never used smokeless tobacco. He reports that he does not drink alcohol or use drugs.    ROS Review of Systems  Constitutional: Negative for chills, diaphoresis and fever.  HENT: Negative for rhinorrhea and sore throat.   Respiratory: Negative for cough and shortness of breath.   Cardiovascular: Negative for chest pain.  Gastrointestinal: Negative for abdominal pain.  Musculoskeletal: Negative for arthralgias and myalgias.  Skin: Negative for rash.  Neurological: Negative for weakness and headaches.    Objective:  BP 116/69   Pulse 62   Temp 98.3 F (36.8 C) (Oral)   Ht '6\' 1"'$  (1.854 m)   Wt 249 lb (112.9 kg)   SpO2 95%   BMI 32.85 kg/m   BP Readings from Last 3 Encounters:  11/08/15 116/69  10/17/15 124/73  03/18/15 140/88    Wt Readings from Last 3 Encounters:  11/08/15 249 lb (112.9 kg)  10/17/15 250 lb 4 oz (113.5 kg)  03/18/15 261 lb 9.6 oz (118.7 kg)     Physical Exam  Constitutional: He appears well-developed and well-nourished.    HENT:  Head: Normocephalic and atraumatic.  Right Ear: Tympanic membrane and external ear normal. No decreased hearing is noted.  Left Ear: Tympanic membrane and external ear normal. No decreased hearing is noted.  Mouth/Throat: No oropharyngeal exudate or posterior oropharyngeal erythema.  Eyes: Pupils are equal, round, and reactive to light.  Neck: Normal range of motion. Neck supple.  Cardiovascular: Normal rate and regular rhythm.   No murmur heard. Pulmonary/Chest: Breath sounds normal. No respiratory distress.  Abdominal: Soft. Bowel sounds are normal. He exhibits no mass. There is no tenderness.  Vitals reviewed.    Lab Results  Component Value Date   WBC 3.8 (A) 03/05/2012   HGB 17.1 03/05/2012   HCT 52.9 03/05/2012   PLT 268 12/28/2008   GLUCOSE 135 (H) 01/28/2015   CHOL 160 01/28/2015   TRIG 133 01/28/2015   HDL 46 01/28/2015   LDLCALC 87 01/28/2015   ALT 20 01/28/2015   AST 14 01/28/2015   NA 139 01/28/2015   K 4.4 01/28/2015   CL 98 01/28/2015   CREATININE 1.00 03/11/2015   BUN 11 01/28/2015   CO2 27 01/28/2015   PSA 0.4 09/05/2013   HGBA1C 7.4 01/28/2015    Ct Abdomen Pelvis W Contrast  Result Date: 03/12/2015 CLINICAL DATA:  Lower abdominal pain for 1 months with occasional nausea. Diabetes. Hypertension. Possible colitis. EXAM: CT ABDOMEN AND PELVIS WITH CONTRAST TECHNIQUE: Multidetector CT imaging of the  abdomen and pelvis was performed using the standard protocol following bolus administration of intravenous contrast. CONTRAST:  182m OMNIPAQUE IOHEXOL 300 MG/ML  SOLN COMPARISON:  Plain film 11/28/2008.  No prior CT. FINDINGS: Lower chest: Volume loss in the posterior right middle lobe, likely related to scarring. There is also minimal volume loss in the left lung base. Mild cardiomegaly, without pericardial or pleural effusion. Hepatobiliary: Normal liver. Normal gallbladder, without biliary ductal dilatation. Pancreas: Fatty replacement throughout the  pancreas, without duct dilatation, mass, or acute inflammation. Spleen: Normal in size, without focal abnormality. Adrenals/Urinary Tract: Normal adrenal glands. Normal kidneys, without hydronephrosis. Normal urinary bladder. Stomach/Bowel: Normal stomach, without wall thickening. Normal colon, appendix, and terminal ileum. Normal small bowel. Vascular/Lymphatic: Normal caliber of the aorta and branch vessels. No abdominopelvic adenopathy. Reproductive: Normal prostate. Other: No significant free fluid. Tiny fat containing ventral abdominal wall hernia. Musculoskeletal: Degenerative partial fusion of the bilateral sacroiliac joints. Remote posterior right twelfth rib trauma. Mild disc bulge at L4-5. IMPRESSION: No acute process in the abdomen or pelvis. Electronically Signed   By: KAbigail MiyamotoM.D.   On: 03/12/2015 08:12    Assessment & Plan:   [name] seen today for uri.  Diagnoses and all orders for this visit:  Cough -     DG Chest 2 View; Future -     CBC with Differential/Platelet -     CMP14+EGFR -     PR BREATHING CAPACITY TEST -     betamethasone acetate-betamethasone sodium phosphate (CELESTONE) injection 6 mg; Inject 1 mL (6 mg total) into the muscle once.  Restrictive airway Burnett  Other orders -     amLODipine (NORVASC) 5 MG tablet; Take 1 tablet (5 mg total) by mouth daily. For blood pressure -     levofloxacin (LEVAQUIN) 500 MG tablet; Take 1 tablet (500 mg total) by mouth daily.   The cough is resembling more more a cough related to the use of ACE inhibitor. I think we definitely need to stop his lisinopril for a trial. Unfortunately due to his blood pressure will need to switch over to amlodipine.  His restrictive airway Burnett as seen on spirometry is incidental apparently at this time. However if the ACE inhibitor does not relieve his cough will need to pursue that as well.  I have discontinued Mr. DGozaazithromycin and lisinopril. I am also having him start on  amLODipine and levofloxacin. Additionally, I am having him maintain his valACYclovir, simvastatin, and metFORMIN. We will continue to administer betamethasone acetate-betamethasone sodium phosphate.  Meds ordered this encounter  Medications  . betamethasone acetate-betamethasone sodium phosphate (CELESTONE) injection 6 mg  . amLODipine (NORVASC) 5 MG tablet    Sig: Take 1 tablet (5 mg total) by mouth daily. For blood pressure    Dispense:  30 tablet    Refill:  5  . levofloxacin (LEVAQUIN) 500 MG tablet    Sig: Take 1 tablet (500 mg total) by mouth daily.    Dispense:  7 tablet    Refill:  0     Follow-up: Return in about 2 weeks (around 11/22/2015).  WClaretta Fraise M.D.

## 2015-11-09 LAB — CMP14+EGFR
A/G RATIO: 1.6 (ref 1.2–2.2)
ALBUMIN: 4.4 g/dL (ref 3.5–5.5)
ALT: 20 IU/L (ref 0–44)
AST: 20 IU/L (ref 0–40)
Alkaline Phosphatase: 69 IU/L (ref 39–117)
BILIRUBIN TOTAL: 0.5 mg/dL (ref 0.0–1.2)
BUN / CREAT RATIO: 9 (ref 9–20)
BUN: 8 mg/dL (ref 6–24)
CALCIUM: 9.5 mg/dL (ref 8.7–10.2)
CHLORIDE: 98 mmol/L (ref 96–106)
CO2: 26 mmol/L (ref 18–29)
Creatinine, Ser: 0.89 mg/dL (ref 0.76–1.27)
GFR, EST AFRICAN AMERICAN: 109 mL/min/{1.73_m2} (ref >59)
GFR, EST NON AFRICAN AMERICAN: 94 mL/min/{1.73_m2} (ref >59)
GLOBULIN, TOTAL: 2.8 g/dL (ref 1.5–4.5)
Glucose: 93 mg/dL (ref 65–99)
POTASSIUM: 4.4 mmol/L (ref 3.5–5.2)
Sodium: 139 mmol/L (ref 134–144)
TOTAL PROTEIN: 7.2 g/dL (ref 6.0–8.5)

## 2015-11-09 LAB — CBC WITH DIFFERENTIAL/PLATELET
BASOS: 0 %
Basophils Absolute: 0 10*3/uL (ref 0.0–0.2)
EOS (ABSOLUTE): 0.2 10*3/uL (ref 0.0–0.4)
EOS: 3 %
HEMATOCRIT: 45.9 % (ref 37.5–51.0)
HEMOGLOBIN: 15.9 g/dL (ref 12.6–17.7)
IMMATURE GRANS (ABS): 0 10*3/uL (ref 0.0–0.1)
IMMATURE GRANULOCYTES: 0 %
LYMPHS: 51 %
Lymphocytes Absolute: 3 10*3/uL (ref 0.7–3.1)
MCH: 29.7 pg (ref 26.6–33.0)
MCHC: 34.6 g/dL (ref 31.5–35.7)
MCV: 86 fL (ref 79–97)
MONOCYTES: 8 %
Monocytes Absolute: 0.4 10*3/uL (ref 0.1–0.9)
NEUTROS ABS: 2.2 10*3/uL (ref 1.4–7.0)
NEUTROS PCT: 38 %
PLATELETS: 261 10*3/uL (ref 150–379)
RBC: 5.35 x10E6/uL (ref 4.14–5.80)
RDW: 14.2 % (ref 12.3–15.4)
WBC: 5.8 10*3/uL (ref 3.4–10.8)

## 2015-11-14 ENCOUNTER — Telehealth: Payer: Self-pay | Admitting: Family Medicine

## 2015-11-14 NOTE — Telephone Encounter (Signed)
Pt aware of appointment date/time and that letter is coming with all information needed

## 2015-11-27 ENCOUNTER — Ambulatory Visit (INDEPENDENT_AMBULATORY_CARE_PROVIDER_SITE_OTHER): Payer: BC Managed Care – PPO | Admitting: Family Medicine

## 2015-11-27 ENCOUNTER — Encounter: Payer: Self-pay | Admitting: Family Medicine

## 2015-11-27 VITALS — BP 140/81 | HR 63 | Temp 97.7°F | Ht 73.0 in | Wt 247.2 lb

## 2015-11-27 DIAGNOSIS — Z23 Encounter for immunization: Secondary | ICD-10-CM | POA: Diagnosis not present

## 2015-11-27 DIAGNOSIS — R05 Cough: Secondary | ICD-10-CM

## 2015-11-27 DIAGNOSIS — I10 Essential (primary) hypertension: Secondary | ICD-10-CM

## 2015-11-27 DIAGNOSIS — K2100 Gastro-esophageal reflux disease with esophagitis, without bleeding: Secondary | ICD-10-CM

## 2015-11-27 DIAGNOSIS — J984 Other disorders of lung: Secondary | ICD-10-CM | POA: Diagnosis not present

## 2015-11-27 DIAGNOSIS — R059 Cough, unspecified: Secondary | ICD-10-CM

## 2015-11-27 MED ORDER — LEVALBUTEROL HCL 1.25 MG/0.5ML IN NEBU: 1.2500 mg | INHALATION_SOLUTION | Freq: Once | RESPIRATORY_TRACT | Status: DC

## 2015-11-27 MED ORDER — PANTOPRAZOLE SODIUM 40 MG PO TBEC: 40.0000 mg | DELAYED_RELEASE_TABLET | Freq: Every day | ORAL | 3 refills | Status: DC
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
## 2015-11-29 ENCOUNTER — Ambulatory Visit (HOSPITAL_COMMUNITY): Payer: BC Managed Care – PPO

## 2015-11-29 ENCOUNTER — Other Ambulatory Visit (HOSPITAL_COMMUNITY): Payer: BC Managed Care – PPO

## 2015-12-04 ENCOUNTER — Other Ambulatory Visit: Payer: Self-pay | Admitting: Family Medicine

## 2015-12-04 ENCOUNTER — Ambulatory Visit (HOSPITAL_COMMUNITY)
Admission: RE | Admit: 2015-12-04 | Discharge: 2015-12-04 | Disposition: A | Discharge status: Home / Self Care | Payer: BC Managed Care – PPO | Source: Ambulatory Visit | Attending: Family Medicine | Admitting: Family Medicine

## 2015-12-04 ENCOUNTER — Encounter (HOSPITAL_COMMUNITY): Payer: Self-pay

## 2015-12-04 ENCOUNTER — Ambulatory Visit (HOSPITAL_COMMUNITY): Payer: BC Managed Care – PPO

## 2015-12-04 DIAGNOSIS — K2100 Gastro-esophageal reflux disease with esophagitis, without bleeding: Secondary | ICD-10-CM

## 2015-12-17 ENCOUNTER — Ambulatory Visit (INDEPENDENT_AMBULATORY_CARE_PROVIDER_SITE_OTHER): Payer: BC Managed Care – PPO | Admitting: Family Medicine

## 2015-12-17 ENCOUNTER — Encounter: Payer: Self-pay | Admitting: Family Medicine

## 2015-12-17 VITALS — BP 138/88 | HR 88 | Temp 98.4°F | Ht 73.0 in | Wt 250.0 lb

## 2015-12-17 DIAGNOSIS — R05 Cough: Secondary | ICD-10-CM

## 2015-12-17 DIAGNOSIS — K2100 Gastro-esophageal reflux disease with esophagitis, without bleeding: Secondary | ICD-10-CM

## 2015-12-17 DIAGNOSIS — I10 Essential (primary) hypertension: Secondary | ICD-10-CM | POA: Diagnosis not present

## 2015-12-17 DIAGNOSIS — R059 Cough, unspecified: Secondary | ICD-10-CM

## 2015-12-17 MED ORDER — FEXOFENADINE-PSEUDOEPHED ER 180-240 MG PO TB24: 1.0000 | ORAL_TABLET | Freq: Every evening | ORAL | 11 refills | Status: DC

## 2015-12-17 MED ORDER — AMLODIPINE BESYLATE 10 MG PO TABS: 10.0000 mg | ORAL_TABLET | Freq: Every day | ORAL | 5 refills | Status: DC

## 2015-12-17 NOTE — Progress Notes (Signed)
Subjective:  Patient ID: Tyler Burnett, male    DOB: 08-04-1955  Age: 60 y.o. MRN: [id number]  CC: Results (pt here today to discuss imaging report from 12/04/15)   HPI [name] presents for Continued cough. His cough is a little bit better. However, the major improvement is been that he is no longer vomiting his food. He doesn't have coughing spells when he tries to eat. He is having 2-3 a day. They're associated with drainage and phlegm. There was no heartburn, no indigestion. He has no productivity but feels like something is in his throat at times. He does have some sneezing. He is tolerating the amlodipine for blood pressure well. He does not check his blood pressure at home. Upper GI series was read as normal. Clinical trial suggests that his reflux medicine is helping with his cough and vomiting indicating that there is very likely some level of reflux in spite of the upper GI being normal.   History Reita ChardShanbay has a past medical history of Diabetes mellitus without complication (HCC) and Hypertension.   He has a past surgical history that includes Fracture surgery and arm surgery (1980).   His family history includes Aneurysm in his mother; Heart disease in his father.He reports that he has never smoked. He has never used smokeless tobacco. He reports that he does not drink alcohol or use drugs.    ROS Review of Systems  Constitutional: Negative for chills, diaphoresis and fever.  HENT: Positive for congestion and postnasal drip. Negative for rhinorrhea and sore throat.   Respiratory: Positive for cough. Negative for shortness of breath.   Cardiovascular: Negative for chest pain.  Gastrointestinal: Negative for abdominal pain.  Musculoskeletal: Negative for arthralgias and myalgias.  Skin: Negative for rash.  Neurological: Negative for weakness and headaches.    Objective:  BP 138/88   Pulse 88   Temp 98.4 F (36.9 C)   Ht 6\' 1"  (1.854 m)   Wt 250 lb (113.4 kg)   BMI  32.98 kg/m   BP Readings from Last 3 Encounters:  12/17/15 138/88  11/27/15 140/81  11/08/15 116/69    Wt Readings from Last 3 Encounters:  12/17/15 250 lb (113.4 kg)  11/27/15 247 lb 4 oz (112.2 kg)  11/08/15 249 lb (112.9 kg)     Physical Exam  Constitutional: He is oriented to person, place, and time. He appears well-developed and well-nourished.  HENT:  Head: Normocephalic and atraumatic.  Right Ear: Tympanic membrane and external ear normal. No decreased hearing is noted.  Left Ear: Tympanic membrane and external ear normal. No decreased hearing is noted.  Mouth/Throat: No oropharyngeal exudate or posterior oropharyngeal erythema.  Eyes: Pupils are equal, round, and reactive to light.  Neck: Normal range of motion. Neck supple.  Cardiovascular: Normal rate and regular rhythm.   No murmur heard. Pulmonary/Chest: Breath sounds normal. No respiratory distress.  Abdominal: Soft. Bowel sounds are normal. There is tenderness.  Musculoskeletal: Normal range of motion.  Neurological: He is alert and oriented to person, place, and time.  Skin: Skin is warm and dry.  Vitals reviewed.    Lab Results  Component Value Date   WBC 5.8 11/08/2015   HGB 17.1 03/05/2012   HCT 45.9 11/08/2015   PLT 261 11/08/2015   GLUCOSE 93 11/08/2015   CHOL 160 01/28/2015   TRIG 133 01/28/2015   HDL 46 01/28/2015   LDLCALC 87 01/28/2015   ALT 20 11/08/2015   AST 20 11/08/2015   NA 139  11/08/2015   K 4.4 11/08/2015   CL 98 11/08/2015   CREATININE 0.89 11/08/2015   BUN 8 11/08/2015   CO2 26 11/08/2015   PSA 0.4 09/05/2013   HGBA1C 7.4 01/28/2015   MICROALBUR 50 07/25/2014    Dg Ugi W/high Density W/kub  Result Date: 12/04/2015 CLINICAL DATA:  Coughing after eating.  Food sticking in throat. EXAM: UPPER GI SERIES WITH KUB TECHNIQUE: After obtaining a scout radiograph a routine upper GI series was performed using thin and high density barium. FLUOROSCOPY TIME:  Fluoroscopy Time:  2  minutes 25 seconds Radiation Exposure Index (if provided by the fluoroscopic device): Number of Acquired Spot Images: 3 COMPARISON:  CT 03/10/2013 FINDINGS: Fluoroscopic evaluation of swallowing demonstrates normal esophageal peristalsis. No fixed stricture, fold thickening or mass. No reflux with the water siphon maneuver. The patient swallowed a 13 mm barium tablet which freely passed into the stomach. Stomach, duodenal bulb and duodenal sweep are unremarkable. No ulceration, mass or fold thickening. Normal gastric emptying. IMPRESSION: Unremarkable upper GI. Electronically Signed   By: Charlett Nose M.D.   On: 12/04/2015 10:59    Assessment & Plan:   Amauri was seen today for results.  Diagnoses and all orders for this visit:  Cough  Reflux esophagitis  Essential hypertension  Other orders -     amLODipine (NORVASC) 10 MG tablet; Take 1 tablet (10 mg total) by mouth daily. For blood pressure -     fexofenadine-pseudoephedrine (ALLEGRA-D 24) 180-240 MG 24 hr tablet; Take 1 tablet by mouth every evening. For allergy and congestion  Moderate improvement. They've good bit of today's ongoing symptoms with cough are related to upper respiratory congestion and drainage. We'll add Allegra-D as noted below.   I have changed Mr. Vangilder amLODipine. I am also having him start on fexofenadine-pseudoephedrine. Additionally, I am having him maintain his valACYclovir, metFORMIN, and pantoprazole. We will stop administering levalbuterol.  Meds ordered this encounter  Medications  . amLODipine (NORVASC) 10 MG tablet    Sig: Take 1 tablet (10 mg total) by mouth daily. For blood pressure    Dispense:  30 tablet    Refill:  5  . fexofenadine-pseudoephedrine (ALLEGRA-D 24) 180-240 MG 24 hr tablet    Sig: Take 1 tablet by mouth every evening. For allergy and congestion    Dispense:  30 tablet    Refill:  11     Follow-up: Return in about 1 month (around 01/17/2016).  Mechele Claude, M.D.

## 2016-01-20 ENCOUNTER — Encounter (INDEPENDENT_AMBULATORY_CARE_PROVIDER_SITE_OTHER): Payer: Self-pay

## 2016-01-20 ENCOUNTER — Encounter: Payer: Self-pay | Admitting: Family Medicine

## 2016-01-20 ENCOUNTER — Ambulatory Visit (INDEPENDENT_AMBULATORY_CARE_PROVIDER_SITE_OTHER): Payer: BC Managed Care – PPO | Admitting: Family Medicine

## 2016-01-20 VITALS — BP 138/82 | HR 59 | Temp 98.2°F | Ht 73.0 in | Wt 256.0 lb

## 2016-01-20 DIAGNOSIS — R05 Cough: Secondary | ICD-10-CM | POA: Diagnosis not present

## 2016-01-20 DIAGNOSIS — K2100 Gastro-esophageal reflux disease with esophagitis, without bleeding: Secondary | ICD-10-CM

## 2016-01-20 DIAGNOSIS — E119 Type 2 diabetes mellitus without complications: Secondary | ICD-10-CM

## 2016-01-20 DIAGNOSIS — I10 Essential (primary) hypertension: Secondary | ICD-10-CM | POA: Diagnosis not present

## 2016-01-20 DIAGNOSIS — R059 Cough, unspecified: Secondary | ICD-10-CM

## 2016-01-20 LAB — BAYER DCA HB A1C WAIVED: HB A1C: 7 % — AB (ref <7.0)

## 2016-01-20 MED ORDER — BLOOD GLUCOSE MONITOR KIT: PACK | 0 refills | Status: DC

## 2016-01-20 NOTE — Progress Notes (Addendum)
Subjective:  Patient ID: Tyler Burnett, male    DOB: Jul 08, 1955  Age: 60 y.o. MRN: [id number]  CC: Hypertension (pt here today for a one month follow up after increasing Amlodipine from '5mg'$  to '10mg'$ )   HPI Tyler Burnett presents for continued cough. Feels like phlegm but won't come up. Several episodes daily. Not dyspneic. Nonsmoker. No known exposure. UGI showed no reflux. SX at first better with PPI now back.Has cough so hard that he has chest wall pain afterward. Feels like he can't get a deep breath for several minutes. Soreness persists through the day. Ongoing now for several months. Has not responded to several measures including discontinuing ACE inhibitor and trial of PPI etc.    follow-up of hypertension. Patient has no history of headache chest pain or shortness of breath or recent cough. Patient also denies symptoms of TIA such as numbness weakness lateralizing. Patient checks  blood pressure at home and has not had any elevated readings recently. Patient denies side effects from his medication. States taking it regularly. Follow-up of diabetes. Patient does not check blood sugar at home Patient denies symptoms such as polyuria, polydipsia, excessive hunger, nausea No significant hypoglycemic spells noted. Medications as noted below. Taking them regularly without complication/adverse reaction being reported today.   History Tyler Burnett has a past medical history of Diabetes mellitus without complication ([place]) and Hypertension.   He has a past surgical history that includes Fracture surgery and arm surgery (1980).   His family history includes Aneurysm in his mother; Heart disease in his father.He reports that he has never smoked. He has never used smokeless tobacco. He reports that he does not drink alcohol or use drugs.    Medication List       Accurate as of 01/20/16  8:39 AM. Always use your most recent med list.          amLODipine 10 MG tablet Commonly known as:   NORVASC Take 1 tablet (10 mg total) by mouth daily. For blood pressure   blood glucose meter kit and supplies Kit Dispense based on patient and insurance preference. Use up to four times daily as directed. (FOR ICD-9 250.00, 250.01).   metFORMIN 500 MG tablet Commonly known as:  GLUCOPHAGE Take 1 tablet (500 mg total) by mouth 2 (two) times daily with a meal.   pantoprazole 40 MG tablet Commonly known as:  PROTONIX Take 1 tablet (40 mg total) by mouth daily. For stomach   valACYclovir 500 MG tablet Commonly known as:  VALTREX Take 1 tablet (500 mg total) by mouth daily.        ROS Review of Systems  Constitutional: Negative for chills, diaphoresis, fever and unexpected weight change.  HENT: Negative for congestion, hearing loss, rhinorrhea and sore throat.   Eyes: Negative for visual disturbance.  Respiratory: Negative for cough and shortness of breath.   Cardiovascular: Negative for chest pain.  Gastrointestinal: Negative for abdominal pain, constipation and diarrhea.  Genitourinary: Negative for dysuria and flank pain.  Musculoskeletal: Negative for arthralgias and joint swelling.  Skin: Negative for rash.  Neurological: Negative for dizziness and headaches.  Psychiatric/Behavioral: Negative for dysphoric mood and sleep disturbance.    Objective:  BP 138/82   Pulse (!) 59   Temp 98.2 F (36.8 C) (Oral)   Ht '6\' 1"'$  (1.854 m)   Wt 256 lb (116.1 kg)   BMI 33.78 kg/m   BP Readings from Last 3 Encounters:  01/20/16 138/82  12/17/15 138/88  11/27/15 140/81  Wt Readings from Last 3 Encounters:  01/20/16 256 lb (116.1 kg)  12/17/15 250 lb (113.4 kg)  11/27/15 247 lb 4 oz (112.2 kg)     Physical Exam  Constitutional: He is oriented to person, place, and time. He appears well-developed and well-nourished. No distress.  HENT:  Head: Normocephalic and atraumatic.  Right Ear: External ear normal.  Left Ear: External ear normal.  Nose: Nose normal.   Mouth/Throat: Oropharynx is clear and moist.  Eyes: Conjunctivae and EOM are normal. Pupils are equal, round, and reactive to light.  Neck: Normal range of motion. Neck supple. No thyromegaly present.  Cardiovascular: Normal rate, regular rhythm and normal heart sounds.   No murmur heard. Pulmonary/Chest: Effort normal and breath sounds normal. No respiratory distress. He has no wheezes. He has no rales.  Abdominal: Soft. Bowel sounds are normal. He exhibits no distension. There is no tenderness.  Lymphadenopathy:    He has no cervical adenopathy.  Neurological: He is alert and oriented to person, place, and time. He has normal reflexes.  Skin: Skin is warm and dry.  Psychiatric: He has a normal mood and affect. His behavior is normal. Judgment and thought content normal.     Lab Results  Component Value Date   WBC 5.8 11/08/2015   HGB 17.1 03/05/2012   HCT 45.9 11/08/2015   PLT 261 11/08/2015   GLUCOSE 93 11/08/2015   CHOL 160 01/28/2015   TRIG 133 01/28/2015   HDL 46 01/28/2015   LDLCALC 87 01/28/2015   ALT 20 11/08/2015   AST 20 11/08/2015   NA 139 11/08/2015   K 4.4 11/08/2015   CL 98 11/08/2015   CREATININE 0.89 11/08/2015   BUN 8 11/08/2015   CO2 26 11/08/2015   PSA 0.4 09/05/2013   HGBA1C 7.4 01/28/2015   MICROALBUR 50 07/25/2014    Dg Ugi W/high Density W/kub  Result Date: 12/04/2015 CLINICAL DATA:  Coughing after eating.  Food sticking in throat. EXAM: UPPER GI SERIES WITH KUB TECHNIQUE: After obtaining a scout radiograph a routine upper GI series was performed using thin and high density barium. FLUOROSCOPY TIME:  Fluoroscopy Time:  2 minutes 25 seconds Radiation Exposure Index (if provided by the fluoroscopic device): Number of Acquired Spot Images: 3 COMPARISON:  CT 03/10/2013 FINDINGS: Fluoroscopic evaluation of swallowing demonstrates normal esophageal peristalsis. No fixed stricture, fold thickening or mass. No reflux with the water siphon maneuver. The  patient swallowed a 13 mm barium tablet which freely passed into the stomach. Stomach, duodenal bulb and duodenal sweep are unremarkable. No ulceration, mass or fold thickening. Normal gastric emptying. IMPRESSION: Unremarkable upper GI. Electronically Signed   By: Rolm Baptise M.D.   On: 12/04/2015 10:59    Assessment & Plan:   Tyler Burnett was seen today for hypertension.  Diagnoses and all orders for this visit:  Essential hypertension, benign -     CMP14+EGFR  Type 2 diabetes mellitus without complication, without long-term current use of insulin (HCC) -     Bayer DCA Hb A1c Waived -     CBC with Differential/Platelet -     CMP14+EGFR -     Lipid panel  Reflux esophagitis -     CBC with Differential/Platelet -     CMP14+EGFR  Cough -     CT Chest Wo Contrast; Future  Other orders -     blood glucose meter kit and supplies KIT; Dispense based on patient and insurance preference. Use up to four times daily as  directed. (FOR ICD-9 250.00, 250.01).     I have discontinued Mr. Poitra fexofenadine-pseudoephedrine. I am also having him start on blood glucose meter kit and supplies. Additionally, I am having him maintain his valACYclovir, metFORMIN, pantoprazole, and amLODipine.  Meds ordered this encounter  Medications  . blood glucose meter kit and supplies KIT    Sig: Dispense based on patient and insurance preference. Use up to four times daily as directed. (FOR ICD-9 250.00, 250.01).    Dispense:  1 each    Refill:  0    Order Specific Question:   Number of strips    Answer:   100    Order Specific Question:   Number of lancets    Answer:   100     Follow-up: Return in about 3 months (around 04/21/2016).  Claretta Fraise, M.D.

## 2016-01-21 LAB — CBC WITH DIFFERENTIAL/PLATELET
BASOS ABS: 0 10*3/uL (ref 0.0–0.2)
Basos: 0 %
EOS (ABSOLUTE): 0.2 10*3/uL (ref 0.0–0.4)
Eos: 4 %
HEMOGLOBIN: 15.3 g/dL (ref 12.6–17.7)
Hematocrit: 45.2 % (ref 37.5–51.0)
IMMATURE GRANS (ABS): 0 10*3/uL (ref 0.0–0.1)
Immature Granulocytes: 0 %
LYMPHS: 53 %
Lymphocytes Absolute: 2.5 10*3/uL (ref 0.7–3.1)
MCH: 29.4 pg (ref 26.6–33.0)
MCHC: 33.8 g/dL (ref 31.5–35.7)
MCV: 87 fL (ref 79–97)
MONOCYTES: 8 %
Monocytes Absolute: 0.4 10*3/uL (ref 0.1–0.9)
NEUTROS ABS: 1.6 10*3/uL (ref 1.4–7.0)
Neutrophils: 35 %
PLATELETS: 263 10*3/uL (ref 150–379)
RBC: 5.21 x10E6/uL (ref 4.14–5.80)
RDW: 13.6 % (ref 12.3–15.4)
WBC: 4.7 10*3/uL (ref 3.4–10.8)

## 2016-01-21 LAB — CMP14+EGFR
ALK PHOS: 66 IU/L (ref 39–117)
ALT: 16 IU/L (ref 0–44)
AST: 12 IU/L (ref 0–40)
Albumin/Globulin Ratio: 1.5 (ref 1.2–2.2)
Albumin: 4.1 g/dL (ref 3.5–5.5)
BILIRUBIN TOTAL: 0.5 mg/dL (ref 0.0–1.2)
BUN/Creatinine Ratio: 7 — ABNORMAL LOW (ref 9–20)
BUN: 6 mg/dL (ref 6–24)
CHLORIDE: 97 mmol/L (ref 96–106)
CO2: 24 mmol/L (ref 18–29)
CREATININE: 0.89 mg/dL (ref 0.76–1.27)
Calcium: 9.4 mg/dL (ref 8.7–10.2)
GFR calc Af Amer: 108 mL/min/{1.73_m2} (ref >59)
GFR calc non Af Amer: 94 mL/min/{1.73_m2} (ref >59)
GLUCOSE: 149 mg/dL — AB (ref 65–99)
Globulin, Total: 2.8 g/dL (ref 1.5–4.5)
Potassium: 4.6 mmol/L (ref 3.5–5.2)
Sodium: 138 mmol/L (ref 134–144)
TOTAL PROTEIN: 6.9 g/dL (ref 6.0–8.5)

## 2016-01-21 LAB — LIPID PANEL
CHOLESTEROL TOTAL: 203 mg/dL — AB (ref 100–199)
Chol/HDL Ratio: 4.2 ratio units (ref 0.0–5.0)
HDL: 48 mg/dL (ref >39)
LDL CALC: 131 mg/dL — AB (ref 0–99)
Triglycerides: 118 mg/dL (ref 0–149)
VLDL CHOLESTEROL CAL: 24 mg/dL (ref 5–40)

## 2016-01-22 ENCOUNTER — Telehealth: Payer: Self-pay | Admitting: Family Medicine

## 2016-01-22 ENCOUNTER — Other Ambulatory Visit: Payer: Self-pay | Admitting: *Deleted

## 2016-01-22 MED ORDER — ATORVASTATIN CALCIUM 40 MG PO TABS: 40.0000 mg | ORAL_TABLET | Freq: Every day | ORAL | 3 refills | Status: DC

## 2016-01-22 NOTE — Telephone Encounter (Signed)
Labs reviewed.

## 2016-01-28 ENCOUNTER — Telehealth: Payer: Self-pay

## 2016-01-28 ENCOUNTER — Encounter: Payer: Self-pay | Admitting: Family Medicine

## 2016-01-28 DIAGNOSIS — R52 Pain, unspecified: Secondary | ICD-10-CM | POA: Insufficient documentation

## 2016-01-28 NOTE — Telephone Encounter (Signed)
Please appealed using the diagnosis of cough - induced chest pain

## 2016-02-12 ENCOUNTER — Other Ambulatory Visit: Payer: Self-pay

## 2016-02-12 DIAGNOSIS — R059 Cough, unspecified: Secondary | ICD-10-CM

## 2016-02-12 DIAGNOSIS — R05 Cough: Secondary | ICD-10-CM

## 2016-02-24 ENCOUNTER — Other Ambulatory Visit: Payer: Self-pay | Admitting: Family

## 2016-03-11 ENCOUNTER — Institutional Professional Consult (permissible substitution): Payer: BC Managed Care – PPO | Admitting: Internal Medicine

## 2016-04-01 ENCOUNTER — Institutional Professional Consult (permissible substitution): Payer: BC Managed Care – PPO | Admitting: Internal Medicine

## 2016-04-02 ENCOUNTER — Encounter: Payer: Self-pay | Admitting: Internal Medicine

## 2016-04-02 ENCOUNTER — Ambulatory Visit (INDEPENDENT_AMBULATORY_CARE_PROVIDER_SITE_OTHER): Payer: BC Managed Care – PPO | Admitting: Internal Medicine

## 2016-04-02 VITALS — BP 130/80 | HR 67 | Ht 72.0 in | Wt 263.8 lb

## 2016-04-02 DIAGNOSIS — R0602 Shortness of breath: Secondary | ICD-10-CM

## 2016-04-02 DIAGNOSIS — R0600 Dyspnea, unspecified: Secondary | ICD-10-CM | POA: Insufficient documentation

## 2016-04-02 DIAGNOSIS — K2100 Gastro-esophageal reflux disease with esophagitis, without bleeding: Secondary | ICD-10-CM

## 2016-04-02 MED ORDER — FAMOTIDINE 20 MG PO TABS: ORAL_TABLET | ORAL | 2 refills | Status: DC

## 2016-04-02 MED ORDER — PANTOPRAZOLE SODIUM 40 MG PO TBEC: 40.0000 mg | DELAYED_RELEASE_TABLET | Freq: Every day | ORAL | 2 refills | Status: DC

## 2016-04-02 NOTE — Progress Notes (Signed)
Subjective:     Patient ID: Tyler Burnett, male   DOB: 18-Jun-1955,     MRN: [id number]  HPI  [age] yobm never smoker/morbidly obese  with "noisy breathing" going all the way the way to HS but able to play sports/ run track and he noted sob assoc dysphagia starting around winter 2017 with nl UGI 12/04/15 so referred to pulmonary clinic 04/02/2016 by Dr  Mechele ClaudeWarren Stacks    04/02/2016 1st [facility] office visit/ Reilyn Nelson   Chief Complaint  Patient presents with  . Pulmonary Consult    Referred by Dr. Mechele ClaudeWarren Stacks. Pt c/o SOB for the past 8 months. He states that he gets SOB sometimes after eating and occ has trouble swallowing.    biggest meal is supper and sob x 30-40 min like a choking spell every time he tried to eat and much bettter off ace 11/2015 and some worse x 2 weeks off gerd rx/ correlates with the amount he eats/ worse p supper as it's the biggest meal.   No problem with ex tol before meals including rapid walks and inclines/steps, no trouble hs flat   Nasal symptoms of nasal congestion  have occurred over the same period of time/ also with bad dentition   No obvious day to day or daytime variability or assoc excess/ purulent sputum or mucus plugs or hemoptysis or cp or chest tightness, subjective wheeze or overt sinus or hb symptoms. No unusual exp hx or h/o childhood pna/ asthma or knowledge of premature birth.  Sleeping ok without nocturnal  or early am exacerbation  of respiratory  c/o's or need for noct saba. Also denies any obvious fluctuation of symptoms with weather or environmental changes or other aggravating or alleviating factors except as outlined above   Current Medications, Allergies, Complete Past Medical History, Past Surgical History, Family History, and Social History were reviewed in [facility] electronic medical record.  ROS  The following are not active complaints unless bolded sore throat, dysphagia, dental problems, itching, sneezing,  nasal congestion or  excess/ purulent secretions, ear ache,   fever, chills, sweats, unintended wt loss, classically pleuritic or exertional cp,  orthopnea pnd or leg swelling, presyncope, palpitations, abdominal pain, anorexia, nausea, vomiting, diarrhea  or change in bowel or bladder habits, change in stools or urine, dysuria,hematuria,  rash, arthralgias, visual complaints, headache, numbness, weakness or ataxia or problems with walking or coordination,  change in mood/affect or memory.          Review of Systems     Objective:   Physical Exam    amb wm nad/ minimal pseudowheeze better with plm   Wt Readings from Last 3 Encounters:  04/02/16 263 lb 12.8 oz (119.7 kg)  01/20/16 256 lb (116.1 kg)  12/17/15 250 lb (113.4 kg)    Vital signs reviewed - Note on arrival 02 sats  97% on RA     HEENT: nl  turbinates, and oropharynx. Nl external ear canals without cough reflex - poor dentition lower > upper    NECK :  without JVD/Nodes/TM/ nl carotid upstrokes bilaterally   LUNGS: no acc muscle use,  Nl contour chest which is clear to A and P bilaterally without cough on insp or exp maneuvers   CV:  RRR  no s3 or murmur or increase in P2, nad no edema   ABD: tensely obese but  nontender with nl inspiratory excursion in the supine position. No bruits or organomegaly appreciated, bowel sounds nl  MS:  Nl  gait/ ext warm without deformities, calf tenderness, cyanosis or clubbing No obvious joint restrictions   SKIN: warm and dry without lesions    NEURO:  alert, approp, nl sensorium with  no motor or cerebellar deficits apparent.     I personally reviewed images and agree with radiology impression as follows:  CXR:   11/08/15 Stable bibasilar scarring.  No edema or consolidation.    Assessment:

## 2016-04-02 NOTE — Patient Instructions (Addendum)
Pantoprazole (protonix) 40 mg   Take  30-60 min before first meal of the day and Pepcid (famotidine)  20 mg one  After supper until return to office - this is the best way to tell whether stomach acid is contributing to your problem.    GERD (REFLUX)  is an extremely common cause of respiratory symptoms just like yours , many times with no obvious heartburn at all.    It can be treated with medication, but also with lifestyle changes including elevation of the head of your bed (ideally with 6 inch  bed blocks),  Smoking cessation, avoidance of late meals, excessive alcohol, and avoid fatty foods, chocolate, peppermint, colas, red wine, and acidic juices such as orange juice.  NO MINT OR MENTHOL PRODUCTS SO NO COUGH DROPS   USE SUGARLESS CANDY INSTEAD (Jolley ranchers or Stover's or Life Savers) or even ice chips will also do - the key is to swallow to prevent all throat clearing. NO OIL BASED VITAMINS - use powdered substitutes.  Make sure lunch is the biggest  meal you eat and eat less at supper    Please schedule a follow up office visit in 4 weeks, sooner if needed

## 2016-04-03 NOTE — Assessment & Plan Note (Addendum)
-  Spirometry 04/02/2016  FEV1 2.15 (64%)  Ratio 76 with min curvature on no rx  - Trial of diet / max gerd rx 04/02/2016 >>>   Unusual hx of dyphagia and sob with "noisy breathing" only occurring with large meals late at night are typical of a form of VCD/Upper airway cough syndrome is  so named because it's frequently impossible to sort out how much is  CR/sinusitis with freq throat clearing (which can be related to primary GERD)   vs  causing  secondary (" extra esophageal")  GERD from wide swings in gastric pressure that occur with throat clearing, often  promoting self use of mint and menthol lozenges that reduce the lower esophageal sphincter tone and exacerbate the problem further in a cyclical fashion.   These are the same pts (now being labeled as having "irritable larynx syndrome" by some cough centers) who not infrequently have a history of having failed to tolerate ace inhibitors as was clearly the case here ,  dry powder inhalers or biphosphonates or report having atypical/extraesophageal reflux symptoms that don't fully respond to standard doses of PPI and may have well been the case here and are easily confused as having aecopd or asthma flares by even experienced allergists/ pulmonologists (myself included).   The fact he has no symptoms with brisk exercise before meals is reassuring this isn't some form of angina equivalent.   rec max rx for gerd/ diet and f/u in 4 weeks with sinus ct after he completes his dental work planned  Total time devoted to counseling  > 50 % of initial 60 min office visit:  review case with pt/ discussion of options/alternatives/ personally creating written customized instructions  in presence of pt  then going over those specific  Instructions directly with the pt including how to use all of the meds but in particular covering each new medication in detail and the difference between the maintenance= "automatic" meds and the prns using an action plan format for the  latter (If this problem/symptom => do that organization reading Left to right).  Please see AVS from this visit for a full list of these instructions which I personally wrote for this pt and  are unique to this visit.

## 2016-04-03 NOTE — Assessment & Plan Note (Signed)
Body mass index is 35.78 kg/m.  No results found for: TSH   Contributing to gerd tendency/ reviewed the need and the process to achieve and maintain neg calorie balance > defer f/u primary care including intermittently monitoring thyroid status

## 2016-04-03 NOTE — Assessment & Plan Note (Addendum)
12/04/15  UGI :   Unremarkable upper GI fasting state  - this doesn't really prove anything re his sense of sob and dysphagia as note he does not have the symptoms at any other time than after his biggest meal of the day.

## 2016-05-01 ENCOUNTER — Ambulatory Visit: Payer: BC Managed Care – PPO | Admitting: Internal Medicine

## 2016-05-16 LAB — HM DIABETES EYE EXAM

## 2016-07-14 ENCOUNTER — Other Ambulatory Visit: Payer: Self-pay | Admitting: Family Medicine

## 2016-07-20 ENCOUNTER — Ambulatory Visit (INDEPENDENT_AMBULATORY_CARE_PROVIDER_SITE_OTHER): Payer: BC Managed Care – PPO | Admitting: Pediatrics

## 2016-07-20 ENCOUNTER — Encounter: Payer: Self-pay | Admitting: Pediatrics

## 2016-07-20 VITALS — BP 136/74 | HR 67 | Temp 98.4°F | Ht 72.0 in | Wt 264.0 lb

## 2016-07-20 DIAGNOSIS — I10 Essential (primary) hypertension: Secondary | ICD-10-CM

## 2016-07-20 DIAGNOSIS — R0683 Snoring: Secondary | ICD-10-CM | POA: Diagnosis not present

## 2016-07-20 DIAGNOSIS — E785 Hyperlipidemia, unspecified: Secondary | ICD-10-CM | POA: Diagnosis not present

## 2016-07-20 DIAGNOSIS — E119 Type 2 diabetes mellitus without complications: Secondary | ICD-10-CM

## 2016-07-20 DIAGNOSIS — R609 Edema, unspecified: Secondary | ICD-10-CM | POA: Diagnosis not present

## 2016-07-20 LAB — BAYER DCA HB A1C WAIVED: HB A1C: 8.4 % — AB (ref <7.0)

## 2016-07-20 MED ORDER — FUROSEMIDE 20 MG PO TABS: 20.0000 mg | ORAL_TABLET | Freq: Every day | ORAL | 0 refills | Status: DC

## 2016-07-20 NOTE — Progress Notes (Signed)
  Subjective:   Patient ID: Tyler Burnett, male    DOB: 1955-09-25, 61 y.o.   MRN: [id number] CC: Leg Swelling (bilateral )  HPI: Tyler Burnett is a 61 y.o. male presenting for Leg Swelling (bilateral )  Here today with his daughter  Past 1-2 weeks avoiding red meat more, eating more vegetables  Past few weeks has noticed swelling in LE Was worse when he wore boots for work Stopped wearing boots, now in shoes Swelling improves some by the morning Is worse after standing on his feet at work all day No pain No SOB, no CP Able to do same amount of exercise/exertion as usual  Taking BP med daily Thinks he is taking his DM2 med metformin as well  Snores loudly per daughte,r she hears pauses with his breathing regularly, he will then cough/sputter and wake up some +daytime fatigue Doesn't feel well rested in the morning  Relevant past medical, surgical, family and social history reviewed. Allergies and medications reviewed and updated. History  Smoking Status  . Never Smoker  Smokeless Tobacco  . Never Used   ROS: Per HPI   Objective:    BP 136/74 (BP Location: Left Arm)   Pulse 67   Temp 98.4 F (36.9 C) (Oral)   Ht 6' (1.829 m)   Wt 264 lb (119.7 kg)   BMI 35.80 kg/m   Wt Readings from Last 3 Encounters:  07/20/16 264 lb (119.7 kg)  04/02/16 263 lb 12.8 oz (119.7 kg)  01/20/16 256 lb (116.1 kg)    Gen: NAD, alert, cooperative with exam, NCAT EYES: EOMI, no conjunctival injection, or no icterus ENT:  OP without erythema LYMPH: no cervical LAD CV: NRRR, normal S1/S2, no murmur, distal pulses 2+ b/l Resp: CTABL, no wheezes, normal WOB Abd: +BS, soft, NTND. no guarding or organomegaly Ext: trace pitting edema b/l ankles to distal shin, warm Neuro: Alert and oriented, strength equal b/l UE and LE, coordination grossly normal MSK: normal muscle bulk  Assessment & Plan:  Tyler Burnett was seen today for leg swelling.  Diagnoses and all orders for this visit:  Essential  hypertension, benign Check labs Cont amlodipine for now, can cause swelling -     BMP8+EGFR  Type 2 diabetes mellitus without complication, without long-term current use of insulin (HCC) Cont metformin, check A1c -     Bayer DCA Hb A1c Waived  Hyperlipidemia, unspecified hyperlipidemia type Stable, cont statin  Swelling Check labs, take below Use compression hose  Re-evaluate in 2 weeks -     furosemide (LASIX) 20 MG tablet; Take 1 tablet (20 mg total) by mouth daily.  Snoring Refer for sleep apnea eval -     Ambulatory referral to Neurology  Follow up plan: Return in about 2 weeks (around 08/03/2016) for  . Assunta Found, MD [place]

## 2016-07-22 LAB — BMP8+EGFR
BUN / CREAT RATIO: 10 (ref 9–20)
BUN: 9 mg/dL (ref 6–24)
CALCIUM: 9.5 mg/dL (ref 8.7–10.2)
CHLORIDE: 96 mmol/L (ref 96–106)
CO2: 28 mmol/L (ref 18–29)
CREATININE: 0.88 mg/dL (ref 0.76–1.27)
GFR, EST AFRICAN AMERICAN: 109 mL/min/{1.73_m2} (ref >59)
GFR, EST NON AFRICAN AMERICAN: 94 mL/min/{1.73_m2} (ref >59)
Glucose: 211 mg/dL — ABNORMAL HIGH (ref 65–99)
Potassium: 4.3 mmol/L (ref 3.5–5.2)
Sodium: 136 mmol/L (ref 134–144)

## 2016-07-23 MED ORDER — METFORMIN HCL 1000 MG PO TABS
1000.0000 mg | ORAL_TABLET | Freq: Two times a day (BID) | ORAL | 5 refills | Status: DC
Start: 2016-07-23 — End: 2017-05-07

## 2016-07-23 NOTE — Addendum Note (Signed)
Addended by: Lorelee CoverOSTOSKY, JESSICA C on: 07/23/2016 11:03 AM   Modules accepted: Orders

## 2016-08-11 ENCOUNTER — Encounter: Payer: BC Managed Care – PPO | Admitting: Family Medicine

## 2016-08-17 ENCOUNTER — Ambulatory Visit (INDEPENDENT_AMBULATORY_CARE_PROVIDER_SITE_OTHER): Payer: BC Managed Care – PPO | Admitting: Family Medicine

## 2016-08-17 ENCOUNTER — Encounter: Payer: Self-pay | Admitting: Family Medicine

## 2016-08-17 VITALS — BP 113/66 | HR 63 | Temp 98.6°F | Ht 72.0 in | Wt 260.0 lb

## 2016-08-17 DIAGNOSIS — I10 Essential (primary) hypertension: Secondary | ICD-10-CM

## 2016-08-17 DIAGNOSIS — K2100 Gastro-esophageal reflux disease with esophagitis, without bleeding: Secondary | ICD-10-CM

## 2016-08-17 DIAGNOSIS — E785 Hyperlipidemia, unspecified: Secondary | ICD-10-CM

## 2016-08-17 DIAGNOSIS — Z0000 Encounter for general adult medical examination without abnormal findings: Secondary | ICD-10-CM

## 2016-08-17 DIAGNOSIS — E119 Type 2 diabetes mellitus without complications: Secondary | ICD-10-CM

## 2016-08-17 LAB — URINALYSIS
BILIRUBIN UA: NEGATIVE
KETONES UA: NEGATIVE
LEUKOCYTES UA: NEGATIVE
NITRITE UA: NEGATIVE
Protein, UA: NEGATIVE
RBC UA: NEGATIVE
SPEC GRAV UA: 1.015 (ref 1.005–1.030)
Urobilinogen, Ur: 1 mg/dL (ref 0.2–1.0)
pH, UA: 5.5 (ref 5.0–7.5)

## 2016-08-17 MED ORDER — BLOOD GLUCOSE MONITOR KIT: PACK | 0 refills | Status: DC

## 2016-08-17 NOTE — Patient Instructions (Addendum)
Check glucose before eating in the morning and again two hours after supper. Write down  the results on the glucose log sheet. Bring the log with you to the next appointment  Diabetes and Foot Care Diabetes may cause you to have problems because of poor blood supply (circulation) to your feet and legs. This may cause the skin on your feet to become thinner, break easier, and heal more slowly. Your skin may become dry, and the skin may peel and crack. You may also have nerve damage in your legs and feet causing decreased feeling in them. You may not notice minor injuries to your feet that could lead to infections or more serious problems. Taking care of your feet is one of the most important things you can do for yourself. Follow these instructions at home:  Wear shoes at all times, even in the house. Do not go barefoot. Bare feet are easily injured.  Check your feet daily for blisters, cuts, and redness. If you cannot see the bottom of your feet, use a mirror or ask someone for help.  Wash your feet with warm water (do not use hot water) and mild soap. Then pat your feet and the areas between your toes until they are completely dry. Do not soak your feet as this can dry your skin.  Apply a moisturizing lotion or petroleum jelly (that does not contain alcohol and is unscented) to the skin on your feet and to dry, brittle toenails. Do not apply lotion between your toes.  Trim your toenails straight across. Do not dig under them or around the cuticle. File the edges of your nails with an emery board or nail file.  Do not cut corns or calluses or try to remove them with medicine.  Wear clean socks or stockings every day. Make sure they are not too tight. Do not wear knee-high stockings since they may decrease blood flow to your legs.  Wear shoes that fit properly and have enough cushioning. To break in new shoes, wear them for just a few hours a day. This prevents you from injuring your feet. Always  look in your shoes before you put them on to be sure there are no objects inside.  Do not cross your legs. This may decrease the blood flow to your feet.  If you find a minor scrape, cut, or break in the skin on your feet, keep it and the skin around it clean and dry. These areas may be cleansed with mild soap and water. Do not cleanse the area with peroxide, alcohol, or iodine.  When you remove an adhesive bandage, be sure not to damage the skin around it.  If you have a wound, look at it several times a day to make sure it is healing.  Do not use heating pads or hot water bottles. They may burn your skin. If you have lost feeling in your feet or legs, you may not know it is happening until it is too late.  Make sure your health care provider performs a complete foot exam at least annually or more often if you have foot problems. Report any cuts, sores, or bruises to your health care provider immediately. Contact a health care provider if:  You have an injury that is not healing.  You have cuts or breaks in the skin.  You have an ingrown nail.  You notice redness on your legs or feet.  You feel burning or tingling in your legs or feet.  You have pain or cramps in your legs and feet.  Your legs or feet are numb.  Your feet always feel cold. Get help right away if:  There is increasing redness, swelling, or pain in or around a wound.  There is a red line that goes up your leg.  Pus is coming from a wound.  You develop a fever or as directed by your health care provider.  You notice a bad smell coming from an ulcer or wound. This information is not intended to replace advice given to you by your health care provider. Make sure you discuss any questions you have with your health care provider. Document Released: 02/14/2000 Document Revised: 07/25/2015 Document Reviewed: 07/26/2012 Elsevier Interactive Patient Education  2017 [employer].

## 2016-08-17 NOTE — Progress Notes (Signed)
Subjective:  Patient ID: Tyler Burnett, male    DOB: Jul 04, 1955  Age: 61 y.o. MRN: [id number]  CC: Annual Exam (pt here today for CPE and also c/o right heel pain especially with weight bearing.)   HPI Tyler Burnett presents for Complete physical. Patient in for follow-up of GERD. Currently asymptomatic taking  PPI daily. There is no chest pain or heartburn. No hematemesis and no melena. No dysphagia or choking. Onset is remote. Progression is stable. Complicating factors, none. Follow-up of diabetes. Patient does not check blood sugar at home Patient denies symptoms such as polyuria, polydipsia, excessive hunger, nausea No significant hypoglycemic spells noted. Medications as noted below. Taking them regularly without complication/adverse reaction being reported today.    History Tyler Burnett has a past medical history of Diabetes mellitus without complication ([place]) and Hypertension.   Tyler Burnett has a past surgical history that includes Fracture surgery and arm surgery (1980).   His family history includes Aneurysm in his mother; Heart disease in his father.Tyler Burnett reports that Tyler Burnett has never smoked. Tyler Burnett has never used smokeless tobacco. Tyler Burnett reports that Tyler Burnett does not drink alcohol or use drugs.    ROS Review of Systems  Constitutional: Negative for activity change, appetite change, chills, diaphoresis, fatigue, fever and unexpected weight change.  HENT: Negative for congestion, ear pain, hearing loss, postnasal drip, rhinorrhea, sore throat, tinnitus and trouble swallowing.   Eyes: Negative for photophobia, pain, discharge and redness.  Respiratory: Negative for apnea, cough, choking, chest tightness, shortness of breath, wheezing and stridor.   Cardiovascular: Negative for chest pain, palpitations and leg swelling.  Gastrointestinal: Negative for abdominal distention, abdominal pain, blood in stool, constipation, diarrhea, nausea and vomiting.  Endocrine: Negative for cold intolerance, heat intolerance,  polydipsia, polyphagia and polyuria.  Genitourinary: Negative for difficulty urinating, dysuria, enuresis, flank pain, frequency, genital sores, hematuria and urgency.  Musculoskeletal: Negative for arthralgias and joint swelling.  Skin: Negative for color change, rash and wound.       Foot pain for 2 days  Allergic/Immunologic: Negative for immunocompromised state.  Neurological: Negative for dizziness, tremors, seizures, syncope, facial asymmetry, speech difficulty, weakness, light-headedness, numbness and headaches.  Hematological: Does not bruise/bleed easily.  Psychiatric/Behavioral: Negative for agitation, behavioral problems, confusion, decreased concentration, dysphoric mood, hallucinations, sleep disturbance and suicidal ideas. The patient is not nervous/anxious and is not hyperactive.     Objective:  BP 113/66   Pulse 63   Temp 98.6 F (37 C) (Oral)   Ht 6' (1.829 m)   Wt 260 lb (117.9 kg)   BMI 35.26 kg/m   BP Readings from Last 3 Encounters:  08/17/16 113/66  07/20/16 136/74  04/02/16 130/80    Wt Readings from Last 3 Encounters:  08/17/16 260 lb (117.9 kg)  07/20/16 264 lb (119.7 kg)  04/02/16 263 lb 12.8 oz (119.7 kg)     Physical Exam  Constitutional: Tyler Burnett is oriented to person, place, and time. Tyler Burnett appears well-developed and well-nourished.  HENT:  Head: Normocephalic and atraumatic.  Mouth/Throat: Oropharynx is clear and moist.  Eyes: EOM are normal. Pupils are equal, round, and reactive to light.  Neck: Normal range of motion. No tracheal deviation present. No thyromegaly present.  Cardiovascular: Normal rate, regular rhythm and normal heart sounds.  Exam reveals no gallop and no friction rub.   No murmur heard. Pulmonary/Chest: Breath sounds normal. Tyler Burnett has no wheezes. Tyler Burnett has no rales.  Abdominal: Soft. Tyler Burnett exhibits no mass. There is no tenderness.  Musculoskeletal: Normal range of motion. Tyler Burnett  exhibits no edema.  Neurological: Tyler Burnett is alert and oriented to  person, place, and time.  Skin: Skin is warm and dry.  There is a hairline split in the skin of the right heel measuring 6 mm. This is through the thickness of the callus.  Psychiatric: Tyler Burnett has a normal mood and affect.      Assessment & Plan:   Tyler Burnett was seen today for annual exam.  Diagnoses and all orders for this visit:  Well adult exam -     CBC with Differential/Platelet -     CMP14+EGFR -     Lipid panel -     Urinalysis -     PSA Total (Reflex To Free) -     Microalbumin / creatinine urine ratio  Type 2 diabetes mellitus without complication, without long-term current use of insulin (HCC) -     CBC with Differential/Platelet -     CMP14+EGFR -     Urinalysis -     Microalbumin / creatinine urine ratio  Essential hypertension, benign -     CBC with Differential/Platelet -     CMP14+EGFR  Hyperlipidemia, unspecified hyperlipidemia type -     CBC with Differential/Platelet -     CMP14+EGFR -     Lipid panel  Reflux esophagitis -     CBC with Differential/Platelet -     CMP14+EGFR  Morbid obesity due to excess calories (HCC) -     CBC with Differential/Platelet -     CMP14+EGFR  Other orders -     blood glucose meter kit and supplies KIT; Dispense based on patient and insurance preference. Use up to four times daily as directed. (FOR ICD-9 250.00, 250.01).       I have discontinued Tyler Burnett atorvastatin. I am also having him start on blood glucose meter kit and supplies. Additionally, I am having him maintain his amLODipine, furosemide, and metFORMIN.  Allergies as of 08/17/2016      Reactions   Doxycycline Other (See Comments)   Abdominal pain      Medication List       Accurate as of 08/17/16 10:19 AM. Always use your most recent med list.          amLODipine 10 MG tablet Commonly known as:  NORVASC TAKE 1 TABLET(10 MG) BY MOUTH DAILY FOR BLOOD PRESSURE   blood glucose meter kit and supplies Kit Dispense based on patient and insurance  preference. Use up to four times daily as directed. (FOR ICD-9 250.00, 250.01).   furosemide 20 MG tablet Commonly known as:  LASIX Take 1 tablet (20 mg total) by mouth daily.   metFORMIN 1000 MG tablet Commonly known as:  GLUCOPHAGE Take 1 tablet (1,000 mg total) by mouth 2 (two) times daily with a meal.       Check glucose before eating in the morning and again two hours after supper. Write down the results on the glucose log sheet. Bring the log with you to the next appointment monitor prescription, log sheet given.  Wound care for the split in the callus was reviewed in detail.  Follow-up: Return in about 3 months (around 11/17/2016), or if symptoms worsen or fail to improve.  Claretta Fraise, M.D.

## 2016-08-18 ENCOUNTER — Other Ambulatory Visit: Payer: Self-pay | Admitting: Pediatrics

## 2016-08-18 DIAGNOSIS — R609 Edema, unspecified: Secondary | ICD-10-CM

## 2016-08-18 LAB — CMP14+EGFR
A/G RATIO: 1.4 (ref 1.2–2.2)
ALBUMIN: 4.2 g/dL (ref 3.5–5.5)
ALT: 18 IU/L (ref 0–44)
AST: 13 IU/L (ref 0–40)
Alkaline Phosphatase: 70 IU/L (ref 39–117)
BILIRUBIN TOTAL: 0.4 mg/dL (ref 0.0–1.2)
BUN / CREAT RATIO: 10 (ref 9–20)
BUN: 10 mg/dL (ref 6–24)
CHLORIDE: 98 mmol/L (ref 96–106)
CO2: 25 mmol/L (ref 20–29)
Calcium: 9.4 mg/dL (ref 8.7–10.2)
Creatinine, Ser: 1.03 mg/dL (ref 0.76–1.27)
GFR calc non Af Amer: 79 mL/min/{1.73_m2} (ref >59)
GFR, EST AFRICAN AMERICAN: 91 mL/min/{1.73_m2} (ref >59)
GLOBULIN, TOTAL: 2.9 g/dL (ref 1.5–4.5)
Glucose: 134 mg/dL — ABNORMAL HIGH (ref 65–99)
POTASSIUM: 4.4 mmol/L (ref 3.5–5.2)
SODIUM: 138 mmol/L (ref 134–144)
TOTAL PROTEIN: 7.1 g/dL (ref 6.0–8.5)

## 2016-08-18 LAB — PSA TOTAL (REFLEX TO FREE): Prostate Specific Ag, Serum: 0.6 ng/mL (ref 0.0–4.0)

## 2016-08-18 LAB — CBC WITH DIFFERENTIAL/PLATELET
BASOS: 0 %
Basophils Absolute: 0 10*3/uL (ref 0.0–0.2)
EOS (ABSOLUTE): 0.2 10*3/uL (ref 0.0–0.4)
Eos: 3 %
HEMOGLOBIN: 15.3 g/dL (ref 13.0–17.7)
Hematocrit: 44.9 % (ref 37.5–51.0)
Immature Grans (Abs): 0 10*3/uL (ref 0.0–0.1)
Immature Granulocytes: 0 %
LYMPHS ABS: 2.4 10*3/uL (ref 0.7–3.1)
LYMPHS: 44 %
MCH: 29.7 pg (ref 26.6–33.0)
MCHC: 34.1 g/dL (ref 31.5–35.7)
MCV: 87 fL (ref 79–97)
Monocytes Absolute: 0.4 10*3/uL (ref 0.1–0.9)
Monocytes: 8 %
NEUTROS ABS: 2.4 10*3/uL (ref 1.4–7.0)
Neutrophils: 45 %
Platelets: 258 10*3/uL (ref 150–379)
RBC: 5.16 x10E6/uL (ref 4.14–5.80)
RDW: 14.2 % (ref 12.3–15.4)
WBC: 5.4 10*3/uL (ref 3.4–10.8)

## 2016-08-18 LAB — MICROALBUMIN / CREATININE URINE RATIO
Creatinine, Urine: 68.3 mg/dL
MICROALB/CREAT RATIO: 23.4 mg/g{creat} (ref 0.0–30.0)
MICROALBUM., U, RANDOM: 16 ug/mL

## 2016-08-18 LAB — LIPID PANEL
Chol/HDL Ratio: 3.5 ratio (ref 0.0–5.0)
Cholesterol, Total: 187 mg/dL (ref 100–199)
HDL: 53 mg/dL (ref >39)
LDL Calculated: 110 mg/dL — ABNORMAL HIGH (ref 0–99)
Triglycerides: 119 mg/dL (ref 0–149)
VLDL CHOLESTEROL CAL: 24 mg/dL (ref 5–40)

## 2016-08-20 ENCOUNTER — Encounter: Payer: Self-pay | Admitting: *Deleted

## 2016-08-24 ENCOUNTER — Telehealth: Payer: Self-pay | Admitting: Family Medicine

## 2016-08-24 MED ORDER — ATORVASTATIN CALCIUM 40 MG PO TABS: 40.0000 mg | ORAL_TABLET | Freq: Every day | ORAL | 3 refills | Status: DC

## 2016-08-24 NOTE — Telephone Encounter (Signed)
Aware and med sent in  

## 2016-09-01 ENCOUNTER — Institutional Professional Consult (permissible substitution): Payer: BC Managed Care – PPO | Admitting: Neurology

## 2016-10-14 ENCOUNTER — Institutional Professional Consult (permissible substitution): Payer: BC Managed Care – PPO | Admitting: Neurology

## 2016-10-16 ENCOUNTER — Other Ambulatory Visit: Payer: Self-pay | Admitting: Family Medicine

## 2016-11-15 ENCOUNTER — Other Ambulatory Visit: Payer: Self-pay | Admitting: Family Medicine

## 2017-01-28 ENCOUNTER — Ambulatory Visit: Payer: BC Managed Care – PPO | Admitting: Family Medicine

## 2017-02-05 ENCOUNTER — Ambulatory Visit: Payer: BC Managed Care – PPO | Admitting: Family Medicine

## 2017-02-21 ENCOUNTER — Other Ambulatory Visit: Payer: Self-pay | Admitting: Family Medicine

## 2017-02-21 DIAGNOSIS — R609 Edema, unspecified: Secondary | ICD-10-CM

## 2017-03-24 ENCOUNTER — Other Ambulatory Visit: Payer: Self-pay | Admitting: Family Medicine

## 2017-03-24 DIAGNOSIS — R609 Edema, unspecified: Secondary | ICD-10-CM

## 2017-04-13 ENCOUNTER — Other Ambulatory Visit: Payer: Self-pay | Admitting: Family Medicine

## 2017-04-23 ENCOUNTER — Ambulatory Visit: Payer: BC Managed Care – PPO | Admitting: Physician Assistant

## 2017-04-23 ENCOUNTER — Encounter: Payer: Self-pay | Admitting: Physician Assistant

## 2017-04-23 VITALS — BP 125/76 | Temp 97.7°F

## 2017-04-23 DIAGNOSIS — R300 Dysuria: Secondary | ICD-10-CM

## 2017-04-23 DIAGNOSIS — N3000 Acute cystitis without hematuria: Secondary | ICD-10-CM | POA: Diagnosis not present

## 2017-04-23 LAB — URINALYSIS
BILIRUBIN UA: POSITIVE — AB
GLUCOSE, UA: NEGATIVE
Leukocytes, UA: NEGATIVE
NITRITE UA: POSITIVE — AB
Urobilinogen, Ur: 1 mg/dL (ref 0.2–1.0)
pH, UA: 5 (ref 5.0–7.5)

## 2017-04-23 MED ORDER — SULFAMETHOXAZOLE-TRIMETHOPRIM 800-160 MG PO TABS: 1.0000 | ORAL_TABLET | Freq: Two times a day (BID) | ORAL | 0 refills | Status: DC

## 2017-04-23 NOTE — Patient Instructions (Signed)
In a few days you may receive a survey in the mail or online from [employer] regarding your visit with us today. Please take a moment to fill this out. Your feedback is very important to our whole office. It can help us better understand your needs as well as improve your experience and satisfaction. Thank you for taking your time to complete it. We care about you.  Emrie Gayle, PA-C  

## 2017-04-25 ENCOUNTER — Other Ambulatory Visit: Payer: Self-pay | Admitting: Family Medicine

## 2017-04-25 DIAGNOSIS — R609 Edema, unspecified: Secondary | ICD-10-CM

## 2017-04-25 LAB — URINE CULTURE: Organism ID, Bacteria: NO GROWTH

## 2017-04-26 NOTE — Progress Notes (Signed)
BP 125/76 (BP Location: Left Arm, Patient Position: Sitting, Cuff Size: Large)   Temp 97.7 F (36.5 C) (Oral)    Subjective:    Patient ID: Tyler Burnett, male    DOB: [birth year], 62 y.o.   MRN: [id number]  HPI: Tyler Burnett is a 62 y.o. male presenting on 04/23/2017 for Groin Pain (left groin pain x 1 week with increase in pain yesterday. No known injury.  No dysuria or hematuria. Denies testicular pain) This patient has had several days of dysuria, frequency and nocturia. There is also pain over the bladder in the suprapubic region, no back pain. Denies leakage or hematuria.  Denies fever or chills. No pain in flank area.  Past Medical History:  Diagnosis Date  . Diabetes mellitus without complication ([place])   . Hypertension    Relevant past medical, surgical, family and social history reviewed and updated as indicated. Interim medical history since our last visit reviewed. Allergies and medications reviewed and updated. DATA REVIEWED: CHART IN EPIC  Family History reviewed for pertinent findings.  Review of Systems  Constitutional: Negative.  Negative for appetite change and fever.  Eyes: Negative for pain and visual disturbance.  Respiratory: Negative.  Negative for cough, chest tightness, shortness of breath and wheezing.   Cardiovascular: Negative.  Negative for chest pain, palpitations and leg swelling.  Gastrointestinal: Negative.  Negative for abdominal pain, diarrhea, nausea and vomiting.  Genitourinary: Positive for difficulty urinating, frequency, testicular pain and urgency. Negative for hematuria, penile pain and penile swelling.  Skin: Negative.  Negative for color change and rash.  Neurological: Negative.  Negative for weakness, numbness and headaches.  Psychiatric/Behavioral: Negative.     Allergies as of 04/23/2017      Reactions   Doxycycline Other (See Comments)   Abdominal pain      Medication List        Accurate as of 04/23/17 11:59 PM. Always use your  most recent med list.          amLODipine 10 MG tablet Commonly known as:  NORVASC TAKE 1 TABLET(10 MG) BY MOUTH DAILY FOR BLOOD PRESSURE   atorvastatin 40 MG tablet Commonly known as:  LIPITOR Take 1 tablet (40 mg total) by mouth daily.   blood glucose meter kit and supplies Kit Dispense based on patient and insurance preference. Use up to four times daily as directed. (FOR ICD-9 250.00, 250.01).   furosemide 20 MG tablet Commonly known as:  LASIX TAKE 1 TABLET(20 MG) BY MOUTH DAILY   metFORMIN 1000 MG tablet Commonly known as:  GLUCOPHAGE Take 1 tablet (1,000 mg total) by mouth 2 (two) times daily with a meal.   sulfamethoxazole-trimethoprim 800-160 MG tablet Commonly known as:  BACTRIM DS,SEPTRA DS Take 1 tablet by mouth 2 (two) times daily.          Objective:    BP 125/76 (BP Location: Left Arm, Patient Position: Sitting, Cuff Size: Large)   Temp 97.7 F (36.5 C) (Oral)   Allergies  Allergen Reactions  . Doxycycline Other (See Comments)    Abdominal pain    Wt Readings from Last 3 Encounters:  08/17/16 260 lb (117.9 kg)  07/20/16 264 lb (119.7 kg)  04/02/16 263 lb 12.8 oz (119.7 kg)    Physical Exam  Constitutional: He appears well-developed and well-nourished.  HENT:  Head: Normocephalic and atraumatic.  Eyes: Conjunctivae and EOM are normal. Pupils are equal, round, and reactive to light.  Neck: Normal range of motion. Neck supple.  Cardiovascular:  Normal rate, regular rhythm and normal heart sounds.  Pulmonary/Chest: Effort normal and breath sounds normal.  Abdominal: Soft. Bowel sounds are normal. Hernia confirmed negative in the right inguinal area and confirmed negative in the left inguinal area.  Genitourinary: Testes normal and penis normal. Right testis shows no mass. Left testis shows no mass.  Musculoskeletal: Normal range of motion.  Skin: Skin is warm and dry.    Results for orders placed or performed in visit on 04/23/17  Urine Culture   Result Value Ref Range   Urine Culture, Routine Final report    Organism ID, Bacteria No growth   Urinalysis  Result Value Ref Range   Specific Gravity, UA >1.030 (H) 1.005 - 1.030   pH, UA 5.0 5.0 - 7.5   Color, UA Orange Yellow   Appearance Ur Hazy (A) Clear   Leukocytes, UA Negative Negative   Protein, UA 1+ (A) Negative/Trace   Glucose, UA Negative Negative   Ketones, UA Trace (A) Negative   RBC, UA Trace (A) Negative   Bilirubin, UA Positive (A) Negative   Urobilinogen, Ur 1.0 0.2 - 1.0 mg/dL   Nitrite, UA Positive (A) Negative      Assessment & Plan:   1. Dysuria - Urine Culture - Urinalysis  2. Acute cystitis without hematuria - sulfamethoxazole-trimethoprim (BACTRIM DS,SEPTRA DS) 800-160 MG tablet; Take 1 tablet by mouth 2 (two) times daily.  Dispense: 20 tablet; Refill: 0   Continue all other maintenance medications as listed above.  Follow up plan: Return if symptoms worsen or fail to improve.  Educational handout given for [place] PA-C [facility] [address]  [place], [place] [address] [phone]   04/26/2017, 10:39 PM

## 2017-05-07 ENCOUNTER — Ambulatory Visit: Payer: BC Managed Care – PPO | Admitting: Family Medicine

## 2017-05-07 ENCOUNTER — Encounter: Payer: Self-pay | Admitting: Family Medicine

## 2017-05-07 VITALS — BP 124/66 | HR 61 | Temp 97.6°F | Ht 72.0 in | Wt 260.0 lb

## 2017-05-07 DIAGNOSIS — E782 Mixed hyperlipidemia: Secondary | ICD-10-CM

## 2017-05-07 DIAGNOSIS — R609 Edema, unspecified: Secondary | ICD-10-CM | POA: Diagnosis not present

## 2017-05-07 DIAGNOSIS — N413 Prostatocystitis: Secondary | ICD-10-CM

## 2017-05-07 DIAGNOSIS — E119 Type 2 diabetes mellitus without complications: Secondary | ICD-10-CM

## 2017-05-07 DIAGNOSIS — I10 Essential (primary) hypertension: Secondary | ICD-10-CM

## 2017-05-07 LAB — URINALYSIS
Bilirubin, UA: NEGATIVE
Ketones, UA: NEGATIVE
LEUKOCYTES UA: NEGATIVE
Nitrite, UA: NEGATIVE
PH UA: 5.5 (ref 5.0–7.5)
RBC UA: NEGATIVE
Specific Gravity, UA: >1.03 — ABNORMAL HIGH (ref 1.005–1.030)
Urobilinogen, Ur: 0.2 mg/dL (ref 0.2–1.0)

## 2017-05-07 LAB — BAYER DCA HB A1C WAIVED: HB A1C (BAYER DCA - WAIVED): 7.4 % — ABNORMAL HIGH (ref <7.0)

## 2017-05-07 MED ORDER — FUROSEMIDE 20 MG PO TABS: 20.0000 mg | ORAL_TABLET | Freq: Every day | ORAL | 1 refills | Status: DC

## 2017-05-07 MED ORDER — AMLODIPINE BESYLATE 10 MG PO TABS: ORAL_TABLET | ORAL | 1 refills | Status: DC

## 2017-05-07 MED ORDER — SULFAMETHOXAZOLE-TRIMETHOPRIM 800-160 MG PO TABS: 1.0000 | ORAL_TABLET | Freq: Two times a day (BID) | ORAL | 0 refills | Status: DC

## 2017-05-07 MED ORDER — METFORMIN HCL 1000 MG PO TABS: 1000.0000 mg | ORAL_TABLET | Freq: Two times a day (BID) | ORAL | 1 refills | Status: DC

## 2017-05-07 NOTE — Progress Notes (Signed)
Subjective:  Patient ID: Tyler Burnett,  male    DOB: [birth year]  Age: 62 y.o.    CC: Diabetes   HPI Josejuan Kendall presents for  follow-up of hypertension. Patient has no history of headache chest pain or shortness of breath or recent cough. Patient also denies symptoms of TIA such as numbness weakness lateralizing. Patient checks  blood pressure at home. Recent readings have been good Patient denies side effects from medication. States taking it regularly.  Patient also  in for follow-up of elevated cholesterol. Doing well without complaints on current medication. Denies side effects of statin including myalgia and arthralgia and nausea. Also in today for liver function testing. Currently no chest pain, shortness of breath or other cardiovascular related symptoms noted.  Follow-up of diabetes. Patient does not check blood sugar at home. Patient denies symptoms such as polyuria, polydipsia, excessive hunger, nausea No significant hypoglycemic spells noted. Medications reviewed. Pt reports taking them regularly. Pt. denies complication/adverse reaction today.    History Welles has a past medical history of Diabetes mellitus without complication ([place]) and Hypertension.   He has a past surgical history that includes Fracture surgery and arm surgery (1980).   His family history includes Aneurysm in his mother; Heart disease in his father.He reports that  has never smoked. he has never used smokeless tobacco. He reports that he does not drink alcohol or use drugs.  Current Outpatient Medications on File Prior to Visit  Medication Sig Dispense Refill  . atorvastatin (LIPITOR) 40 MG tablet Take 1 tablet (40 mg total) by mouth daily. 90 tablet 3  . blood glucose meter kit and supplies KIT Dispense based on patient and insurance preference. Use up to four times daily as directed. (FOR ICD-9 250.00, 250.01). 1 each 0   No current facility-administered medications on file prior to visit.      ROS Review of Systems  Constitutional: Negative for chills, diaphoresis, fever and unexpected weight change.  HENT: Negative for congestion, hearing loss, rhinorrhea and sore throat.   Eyes: Negative for visual disturbance.  Respiratory: Negative for cough and shortness of breath.   Cardiovascular: Negative for chest pain.  Gastrointestinal: Negative for abdominal pain, constipation and diarrhea.  Genitourinary: Positive for dysuria and frequency. Negative for flank pain.  Musculoskeletal: Negative for arthralgias and joint swelling.  Skin: Negative for rash.  Neurological: Negative for dizziness and headaches.  Psychiatric/Behavioral: Negative for dysphoric mood and sleep disturbance.    Objective:  BP 124/66   Pulse 61   Temp 97.6 F (36.4 C) (Oral)   Ht 6' (1.829 m)   Wt 260 lb (117.9 kg)   BMI 35.26 kg/m   BP Readings from Last 3 Encounters:  05/07/17 124/66  04/23/17 125/76  08/17/16 113/66    Wt Readings from Last 3 Encounters:  05/07/17 260 lb (117.9 kg)  08/17/16 260 lb (117.9 kg)  07/20/16 264 lb (119.7 kg)     Physical Exam  Constitutional: He is oriented to person, place, and time. He appears well-developed and well-nourished. No distress.  HENT:  Head: Normocephalic and atraumatic.  Right Ear: External ear normal.  Left Ear: External ear normal.  Nose: Nose normal.  Mouth/Throat: Oropharynx is clear and moist.  Eyes: Conjunctivae and EOM are normal. Pupils are equal, round, and reactive to light.  Neck: Normal range of motion. Neck supple. No thyromegaly present.  Cardiovascular: Normal rate, regular rhythm and normal heart sounds.  No murmur heard. Pulmonary/Chest: Effort normal and breath sounds normal.  No respiratory distress. He has no wheezes. He has no rales.  Abdominal: Soft. Bowel sounds are normal. He exhibits no distension. There is no tenderness.  Lymphadenopathy:    He has no cervical adenopathy.  Neurological: He is alert and  oriented to person, place, and time. He has normal reflexes.  Skin: Skin is warm and dry.  Psychiatric: He has a normal mood and affect. His behavior is normal. Judgment and thought content normal.    Diabetic Foot Exam - Simple   No data filed        Assessment & Plan:   Jontae was seen today for diabetes.  Diagnoses and all orders for this visit:  Essential hypertension, benign -     CBC with Differential/Platelet -     CMP14+EGFR  Swelling -     furosemide (LASIX) 20 MG tablet; Take 1 tablet (20 mg total) by mouth daily.  Type 2 diabetes mellitus without complication, without long-term current use of insulin (HCC) -     Microalbumin / creatinine urine ratio -     Urinalysis -     Bayer DCA Hb A1c Waived  Mixed hyperlipidemia -     Lipid panel  Prostatocystitis  Other orders -     amLODipine (NORVASC) 10 MG tablet; TAKE 1 TABLET(10 MG) BY MOUTH DAILY FOR BLOOD PRESSURE -     metFORMIN (GLUCOPHAGE) 1000 MG tablet; Take 1 tablet (1,000 mg total) by mouth 2 (two) times daily with a meal. -     sulfamethoxazole-trimethoprim (BACTRIM DS) 800-160 MG tablet; Take 1 tablet by mouth 2 (two) times daily. For 1 month   I have discontinued Hason Sparger's sulfamethoxazole-trimethoprim. I have also changed his furosemide. Additionally, I am having him start on sulfamethoxazole-trimethoprim. Lastly, I am having him maintain his blood glucose meter kit and supplies, atorvastatin, amLODipine, and metFORMIN.  Meds ordered this encounter  Medications  . furosemide (LASIX) 20 MG tablet    Sig: Take 1 tablet (20 mg total) by mouth daily.    Dispense:  90 tablet    Refill:  1  . amLODipine (NORVASC) 10 MG tablet    Sig: TAKE 1 TABLET(10 MG) BY MOUTH DAILY FOR BLOOD PRESSURE    Dispense:  90 tablet    Refill:  1    Needs to be seen before next refill  . metFORMIN (GLUCOPHAGE) 1000 MG tablet    Sig: Take 1 tablet (1,000 mg total) by mouth 2 (two) times daily with a meal.     Dispense:  180 tablet    Refill:  1  . sulfamethoxazole-trimethoprim (BACTRIM DS) 800-160 MG tablet    Sig: Take 1 tablet by mouth 2 (two) times daily. For 1 month    Dispense:  60 tablet    Refill:  0     Follow-up: Return in about 3 months (around 08/07/2017).  Claretta Fraise, M.D.

## 2017-05-07 NOTE — Patient Instructions (Signed)

## 2017-05-08 LAB — CMP14+EGFR
ALT: 14 IU/L (ref 0–44)
AST: 11 IU/L (ref 0–40)
Albumin/Globulin Ratio: 1.5 (ref 1.2–2.2)
Albumin: 4.4 g/dL (ref 3.6–4.8)
Alkaline Phosphatase: 73 IU/L (ref 39–117)
BUN/Creatinine Ratio: 11 (ref 10–24)
BUN: 10 mg/dL (ref 8–27)
Bilirubin Total: 0.5 mg/dL (ref 0.0–1.2)
CALCIUM: 9.5 mg/dL (ref 8.6–10.2)
CO2: 26 mmol/L (ref 20–29)
CREATININE: 0.89 mg/dL (ref 0.76–1.27)
Chloride: 100 mmol/L (ref 96–106)
GFR, EST AFRICAN AMERICAN: 107 mL/min/{1.73_m2} (ref >59)
GFR, EST NON AFRICAN AMERICAN: 93 mL/min/{1.73_m2} (ref >59)
Globulin, Total: 2.9 g/dL (ref 1.5–4.5)
Glucose: 143 mg/dL — ABNORMAL HIGH (ref 65–99)
Potassium: 4.7 mmol/L (ref 3.5–5.2)
Sodium: 140 mmol/L (ref 134–144)
TOTAL PROTEIN: 7.3 g/dL (ref 6.0–8.5)

## 2017-05-08 LAB — CBC WITH DIFFERENTIAL/PLATELET
BASOS: 0 %
Basophils Absolute: 0 10*3/uL (ref 0.0–0.2)
EOS (ABSOLUTE): 0.3 10*3/uL (ref 0.0–0.4)
Eos: 7 %
HEMATOCRIT: 47.4 % (ref 37.5–51.0)
Hemoglobin: 15.9 g/dL (ref 13.0–17.7)
IMMATURE GRANS (ABS): 0 10*3/uL (ref 0.0–0.1)
IMMATURE GRANULOCYTES: 0 %
LYMPHS: 52 %
Lymphocytes Absolute: 2.5 10*3/uL (ref 0.7–3.1)
MCH: 29.8 pg (ref 26.6–33.0)
MCHC: 33.5 g/dL (ref 31.5–35.7)
MCV: 89 fL (ref 79–97)
MONOS ABS: 0.3 10*3/uL (ref 0.1–0.9)
Monocytes: 7 %
NEUTROS PCT: 34 %
Neutrophils Absolute: 1.6 10*3/uL (ref 1.4–7.0)
Platelets: 262 10*3/uL (ref 150–379)
RBC: 5.34 x10E6/uL (ref 4.14–5.80)
RDW: 14 % (ref 12.3–15.4)
WBC: 4.8 10*3/uL (ref 3.4–10.8)

## 2017-05-08 LAB — MICROALBUMIN / CREATININE URINE RATIO
CREATININE, UR: 165.5 mg/dL
MICROALBUM., U, RANDOM: 99.9 ug/mL
Microalb/Creat Ratio: 60.4 mg/g creat — ABNORMAL HIGH (ref 0.0–30.0)

## 2017-05-08 LAB — LIPID PANEL
CHOL/HDL RATIO: 2.5 ratio (ref 0.0–5.0)
Cholesterol, Total: 121 mg/dL (ref 100–199)
HDL: 49 mg/dL (ref >39)
LDL CALC: 57 mg/dL (ref 0–99)
TRIGLYCERIDES: 73 mg/dL (ref 0–149)
VLDL CHOLESTEROL CAL: 15 mg/dL (ref 5–40)

## 2017-05-09 ENCOUNTER — Encounter: Payer: Self-pay | Admitting: Family Medicine

## 2017-05-09 ENCOUNTER — Other Ambulatory Visit: Payer: Self-pay | Admitting: Family Medicine

## 2017-05-09 MED ORDER — LISINOPRIL 20 MG PO TABS: 20.0000 mg | ORAL_TABLET | Freq: Every day | ORAL | 3 refills | Status: DC

## 2017-06-02 ENCOUNTER — Other Ambulatory Visit: Payer: Self-pay | Admitting: Pediatrics

## 2017-07-23 ENCOUNTER — Ambulatory Visit: Payer: BC Managed Care – PPO | Admitting: Family Medicine

## 2017-08-10 ENCOUNTER — Ambulatory Visit: Payer: BC Managed Care – PPO | Admitting: Family Medicine

## 2017-08-17 ENCOUNTER — Ambulatory Visit: Payer: BC Managed Care – PPO | Admitting: Family Medicine

## 2017-08-17 ENCOUNTER — Encounter: Payer: Self-pay | Admitting: Family Medicine

## 2017-08-17 VITALS — BP 134/70 | HR 57 | Temp 97.1°F | Ht 72.0 in | Wt 251.0 lb

## 2017-08-17 DIAGNOSIS — I358 Other nonrheumatic aortic valve disorders: Secondary | ICD-10-CM

## 2017-08-17 DIAGNOSIS — I10 Essential (primary) hypertension: Secondary | ICD-10-CM

## 2017-08-17 DIAGNOSIS — E119 Type 2 diabetes mellitus without complications: Secondary | ICD-10-CM

## 2017-08-17 DIAGNOSIS — R609 Edema, unspecified: Secondary | ICD-10-CM | POA: Diagnosis not present

## 2017-08-17 DIAGNOSIS — E782 Mixed hyperlipidemia: Secondary | ICD-10-CM | POA: Diagnosis not present

## 2017-08-17 LAB — CMP14+EGFR
ALK PHOS: 73 IU/L (ref 39–117)
ALT: 11 IU/L (ref 0–44)
AST: 12 IU/L (ref 0–40)
Albumin/Globulin Ratio: 1.8 (ref 1.2–2.2)
Albumin: 4.3 g/dL (ref 3.6–4.8)
BUN/Creatinine Ratio: 12 (ref 10–24)
BUN: 10 mg/dL (ref 8–27)
Bilirubin Total: 0.6 mg/dL (ref 0.0–1.2)
CALCIUM: 9.2 mg/dL (ref 8.6–10.2)
CO2: 26 mmol/L (ref 20–29)
CREATININE: 0.86 mg/dL (ref 0.76–1.27)
Chloride: 99 mmol/L (ref 96–106)
GFR calc Af Amer: 109 mL/min/{1.73_m2} (ref >59)
GFR calc non Af Amer: 94 mL/min/{1.73_m2} (ref >59)
GLUCOSE: 152 mg/dL — AB (ref 65–99)
Globulin, Total: 2.4 g/dL (ref 1.5–4.5)
Potassium: 4.9 mmol/L (ref 3.5–5.2)
Sodium: 140 mmol/L (ref 134–144)
Total Protein: 6.7 g/dL (ref 6.0–8.5)

## 2017-08-17 LAB — CBC WITH DIFFERENTIAL/PLATELET
BASOS ABS: 0 10*3/uL (ref 0.0–0.2)
Basos: 0 %
EOS (ABSOLUTE): 0.3 10*3/uL (ref 0.0–0.4)
Eos: 6 %
HEMOGLOBIN: 15.6 g/dL (ref 13.0–17.7)
Hematocrit: 44.5 % (ref 37.5–51.0)
IMMATURE GRANULOCYTES: 0 %
Immature Grans (Abs): 0 10*3/uL (ref 0.0–0.1)
LYMPHS ABS: 2.4 10*3/uL (ref 0.7–3.1)
Lymphs: 50 %
MCH: 29.4 pg (ref 26.6–33.0)
MCHC: 35.1 g/dL (ref 31.5–35.7)
MCV: 84 fL (ref 79–97)
MONOCYTES: 8 %
MONOS ABS: 0.4 10*3/uL (ref 0.1–0.9)
NEUTROS PCT: 36 %
Neutrophils Absolute: 1.7 10*3/uL (ref 1.4–7.0)
Platelets: 221 10*3/uL (ref 150–450)
RBC: 5.31 x10E6/uL (ref 4.14–5.80)
RDW: 14.3 % (ref 12.3–15.4)
WBC: 4.8 10*3/uL (ref 3.4–10.8)

## 2017-08-17 LAB — BAYER DCA HB A1C WAIVED: HB A1C: 7.4 % — AB (ref <7.0)

## 2017-08-17 MED ORDER — METFORMIN HCL 1000 MG PO TABS: ORAL_TABLET | ORAL | 1 refills | Status: DC

## 2017-08-17 MED ORDER — ATORVASTATIN CALCIUM 40 MG PO TABS: 40.0000 mg | ORAL_TABLET | Freq: Every day | ORAL | 3 refills | Status: DC

## 2017-08-17 MED ORDER — FUROSEMIDE 20 MG PO TABS: 20.0000 mg | ORAL_TABLET | Freq: Every day | ORAL | 1 refills | Status: DC

## 2017-08-17 MED ORDER — LISINOPRIL 20 MG PO TABS: 20.0000 mg | ORAL_TABLET | Freq: Every day | ORAL | 3 refills | Status: DC

## 2017-08-17 MED ORDER — AMLODIPINE BESYLATE 10 MG PO TABS: ORAL_TABLET | ORAL | 1 refills | Status: DC

## 2017-08-17 NOTE — Patient Instructions (Signed)
Drink one bottle of Tonic Water every evening for leg cramps.     Carbohydrate Counting for Diabetes Mellitus, Adult Carbohydrate counting is a method for keeping track of how many carbohydrates you eat. Eating carbohydrates naturally increases the amount of sugar (glucose) in the blood. Counting how many carbohydrates you eat helps keep your blood glucose within normal limits, which helps you manage your diabetes (diabetes mellitus). It is important to know how many carbohydrates you can safely have in each meal. This is different for every person. A diet and nutrition specialist (registered dietitian) can help you make a meal plan and calculate how many carbohydrates you should have at each meal and snack. Carbohydrates are found in the following foods:  Grains, such as breads and cereals.  Dried beans and soy products.  Starchy vegetables, such as potatoes, peas, and corn.  Fruit and fruit juices.  Milk and yogurt.  Sweets and snack foods, such as cake, cookies, candy, chips, and soft drinks.  How do I count carbohydrates? There are two ways to count carbohydrates in food. You can use either of the methods or a combination of both. Reading "Nutrition Facts" on packaged food The "Nutrition Facts" list is included on the labels of almost all packaged foods and beverages in the [place] It includes:  The serving size.  Information about nutrients in each serving, including the grams (g) of carbohydrate per serving.  To use the "Nutrition Facts":  Decide how many servings you will have.  Multiply the number of servings by the number of carbohydrates per serving.  The resulting number is the total amount of carbohydrates that you will be having.  Learning standard serving sizes of other foods When you eat foods containing carbohydrates that are not packaged or do not include "Nutrition Facts" on the label, you need to measure the servings in order to count the amount of  carbohydrates:  Measure the foods that you will eat with a food scale or measuring cup, if needed.  Decide how many standard-size servings you will eat.  Multiply the number of servings by 15. Most carbohydrate-rich foods have about 15 g of carbohydrates per serving. ? For example, if you eat 8 oz (170 g) of strawberries, you will have eaten 2 servings and 30 g of carbohydrates (2 servings x 15 g = 30 g).  For foods that have more than one food mixed, such as soups and casseroles, you must count the carbohydrates in each food that is included.  The following list contains standard serving sizes of common carbohydrate-rich foods. Each of these servings has about 15 g of carbohydrates:   hamburger bun or  English muffin.   oz (15 mL) syrup.   oz (14 g) jelly.  1 slice of bread.  1 six-inch tortilla.  3 oz (85 g) cooked rice or pasta.  4 oz (113 g) cooked dried beans.  4 oz (113 g) starchy vegetable, such as peas, corn, or potatoes.  4 oz (113 g) hot cereal.  4 oz (113 g) mashed potatoes or  of a large baked potato.  4 oz (113 g) canned or frozen fruit.  4 oz (120 mL) fruit juice.  4-6 crackers.  6 chicken nuggets.  6 oz (170 g) unsweetened dry cereal.  6 oz (170 g) plain fat-free yogurt or yogurt sweetened with artificial sweeteners.  8 oz (240 mL) milk.  8 oz (170 g) fresh fruit or one small piece of fruit.  24 oz (680 g) popped  popcorn.  Example of carbohydrate counting Sample meal  3 oz (85 g) chicken breast.  6 oz (170 g) brown rice.  4 oz (113 g) corn.  8 oz (240 mL) milk.  8 oz (170 g) strawberries with sugar-free whipped topping. Carbohydrate calculation 1. Identify the foods that contain carbohydrates: ? Rice. ? Corn. ? Milk. ? Strawberries. 2. Calculate how many servings you have of each food: ? 2 servings rice. ? 1 serving corn. ? 1 serving milk. ? 1 serving strawberries. 3. Multiply each number of servings by 15 g: ? 2 servings  rice x 15 g = 30 g. ? 1 serving corn x 15 g = 15 g. ? 1 serving milk x 15 g = 15 g. ? 1 serving strawberries x 15 g = 15 g. 4. Add together all of the amounts to find the total grams of carbohydrates eaten: ? 30 g + 15 g + 15 g + 15 g = 75 g of carbohydrates total. This information is not intended to replace advice given to you by your health care provider. Make sure you discuss any questions you have with your health care provider. Document Released: 02/16/2005 Document Revised: 09/06/2015 Document Reviewed: 07/31/2015 Elsevier Interactive Patient Education  [employer].

## 2017-08-17 NOTE — Progress Notes (Signed)
Subjective:  Patient ID: Tyler Burnett,  male    DOB: 1955-08-11  Age: 62 y.o.    CC: Medical Management of Chronic Issues   HPI Tyler Burnett presents for  follow-up of hypertension. Patient has no history of headache chest pain or shortness of breath or recent cough. Patient also denies symptoms of TIA such as numbness weakness lateralizing. Patient denies side effects from medication. States taking it regularly.  Patient also  in for follow-up of elevated cholesterol. Doing well without complaints on current medication. Denies side effects  including myalgia and arthralgia and nausea. Also in today for liver function testing. Currently no chest pain, shortness of breath or other cardiovascular related symptoms noted.  Follow-up of diabetes. Patientrarely blood sugar at home. Patient denies symptoms such as excessive hunger or urinary frequency, excessive hunger, nausea No significant hypoglycemic spells noted. Medications reviewed. Pt reports taking them regularly. Pt. denies complication/adverse reaction today.    History Tyler Burnett has a past medical history of Diabetes mellitus without complication ([place]) and Hypertension.   Tyler Burnett has a past surgical history that includes Fracture surgery and arm surgery (1980).   His family history includes Aneurysm in his mother; Heart disease in his father.Tyler Burnett reports that Tyler Burnett has never smoked. Tyler Burnett has never used smokeless tobacco. Tyler Burnett reports that Tyler Burnett does not drink alcohol or use drugs.  Current Outpatient Medications on File Prior to Visit  Medication Sig Dispense Refill  . blood glucose meter kit and supplies KIT Dispense based on patient and insurance preference. Use up to four times daily as directed. (FOR ICD-9 250.00, 250.01). 1 each 0   No current facility-administered medications on file prior to visit.     ROS Review of Systems  Objective:  BP 134/70   Pulse (!) 57   Temp (!) 97.1 F (36.2 C) (Oral)   Ht 6' (1.829 m)   Wt 251 lb  (113.9 kg)   BMI 34.04 kg/m   BP Readings from Last 3 Encounters:  08/17/17 134/70  05/07/17 124/66  04/23/17 125/76    Wt Readings from Last 3 Encounters:  08/17/17 251 lb (113.9 kg)  05/07/17 260 lb (117.9 kg)  08/17/16 260 lb (117.9 kg)     Physical Exam  Constitutional: Tyler Burnett is oriented to person, place, and time. Tyler Burnett appears well-developed and well-nourished. No distress.  HENT:  Head: Normocephalic and atraumatic.  Right Ear: External ear normal.  Left Ear: External ear normal.  Nose: Nose normal.  Mouth/Throat: Oropharynx is clear and moist.  Eyes: Pupils are equal, round, and reactive to light. Conjunctivae and EOM are normal.  Neck: Normal range of motion. Neck supple. No thyromegaly present.  Cardiovascular: Normal rate, regular rhythm and normal pulses.  Murmur heard.  Decrescendo systolic murmur is present with a grade of 2/6. Pulmonary/Chest: Effort normal and breath sounds normal. No respiratory distress. Tyler Burnett has no wheezes. Tyler Burnett has no rales.  Abdominal: Soft. Bowel sounds are normal. Tyler Burnett exhibits no distension. There is no tenderness.  Lymphadenopathy:    Tyler Burnett has no cervical adenopathy.  Neurological: Tyler Burnett is alert and oriented to person, place, and time. Tyler Burnett has normal reflexes.  Skin: Skin is warm and dry.  Psychiatric: Tyler Burnett has a normal mood and affect. His behavior is normal. Judgment and thought content normal.    Diabetic Foot Exam - Simple   Simple Foot Form Diabetic Foot exam was performed with the following findings:  Yes 08/17/2017  9:30 AM  Visual Inspection No deformities, no ulcerations, no  other skin breakdown bilaterally:  Yes Sensation Testing Intact to touch and monofilament testing bilaterally:  Yes Pulse Check Posterior Tibialis and Dorsalis pulse intact bilaterally:  Yes Comments       Assessment & Plan:   Tyler Burnett was seen today for medical management of chronic issues.  Diagnoses and all orders for this visit:  Type 2 diabetes  mellitus without complication, without long-term current use of insulin (HCC) -     CBC with Differential/Platelet -     CMP14+EGFR -     Bayer DCA Hb A1c Waived  Swelling -     furosemide (LASIX) 20 MG tablet; Take 1 tablet (20 mg total) by mouth daily.  Essential hypertension, benign  Mixed hyperlipidemia  Aortic heart murmur  Other orders -     amLODipine (NORVASC) 10 MG tablet; TAKE 1 TABLET(10 MG) BY MOUTH DAILY FOR BLOOD PRESSURE -     atorvastatin (LIPITOR) 40 MG tablet; Take 1 tablet (40 mg total) by mouth daily. -     lisinopril (PRINIVIL,ZESTRIL) 20 MG tablet; Take 1 tablet (20 mg total) by mouth daily. -     metFORMIN (GLUCOPHAGE) 1000 MG tablet; TAKE 1 TABLET(1000 MG) BY MOUTH TWICE DAILY WITH A MEAL   I have discontinued Tyler Burnett's sulfamethoxazole-trimethoprim. I am also having him maintain his blood glucose meter kit and supplies, furosemide, amLODipine, atorvastatin, lisinopril, and metFORMIN.  Meds ordered this encounter  Medications  . furosemide (LASIX) 20 MG tablet    Sig: Take 1 tablet (20 mg total) by mouth daily.    Dispense:  90 tablet    Refill:  1  . amLODipine (NORVASC) 10 MG tablet    Sig: TAKE 1 TABLET(10 MG) BY MOUTH DAILY FOR BLOOD PRESSURE    Dispense:  90 tablet    Refill:  1    Needs to be seen before next refill  . atorvastatin (LIPITOR) 40 MG tablet    Sig: Take 1 tablet (40 mg total) by mouth daily.    Dispense:  90 tablet    Refill:  3  . lisinopril (PRINIVIL,ZESTRIL) 20 MG tablet    Sig: Take 1 tablet (20 mg total) by mouth daily.    Dispense:  90 tablet    Refill:  3  . metFORMIN (GLUCOPHAGE) 1000 MG tablet    Sig: TAKE 1 TABLET(1000 MG) BY MOUTH TWICE DAILY WITH A MEAL    Dispense:  180 tablet    Refill:  1   Patient encouraged to check blood sugar twice daily.  If Tyler Burnett only wants to check once a day Tyler Burnett can alternate between fasting and postprandial, but twice daily is ideal.  Please write down on the glucose log and bring to  the next visit with you. Follow-up: Return in about 3 months (around 11/17/2017).  Claretta Fraise, M.D.

## 2017-08-19 IMAGING — CT CT ABD-PELV W/ CM
2 of 5 series · 16 of 46 positions shown, 18 images · IV contrast (Omnipaque 300)
Comparison: Plain film 11/28/2008.  No prior CT.

CLINICAL DATA: Lower abdominal pain for 1 months with occasional
nausea. Diabetes. Hypertension. Possible colitis.

EXAM:
CT ABDOMEN AND PELVIS WITH CONTRAST
TECHNIQUE: Multidetector CT imaging of the abdomen and pelvis was performed
using the standard protocol following bolus administration of
intravenous contrast.
CONTRAST:  100mL OMNIPAQUE IOHEXOL 300 MG/ML  SOLN

[Series 2: abd_pel_with 5.0 b40s · axial · 0.90mm/px · z∈[-576,-116]mm · 13 of 104 slices shown, 15 images]
[im 6/104  soft-tissue]
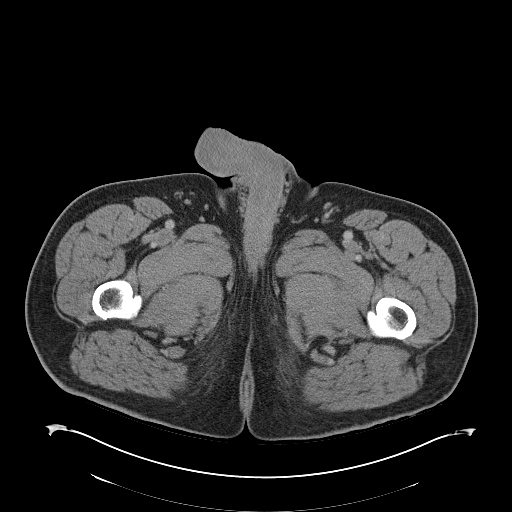
[im 6/104  bone]
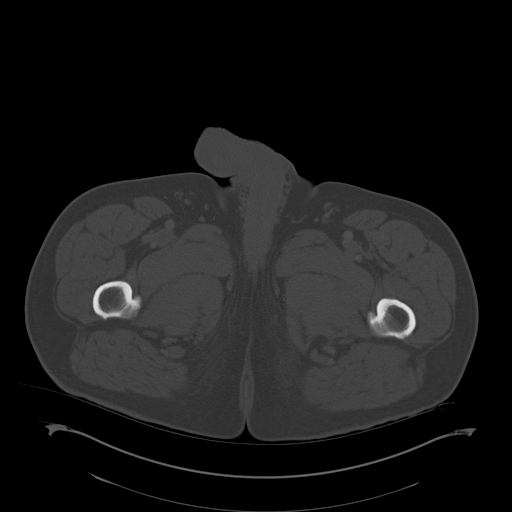
[im 12/104  soft-tissue]
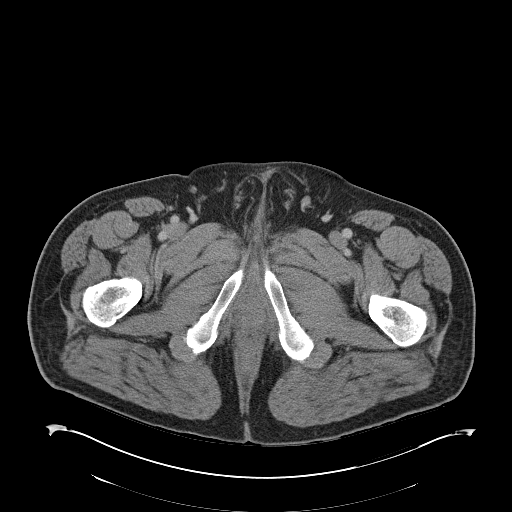
[im 23/104  soft-tissue]
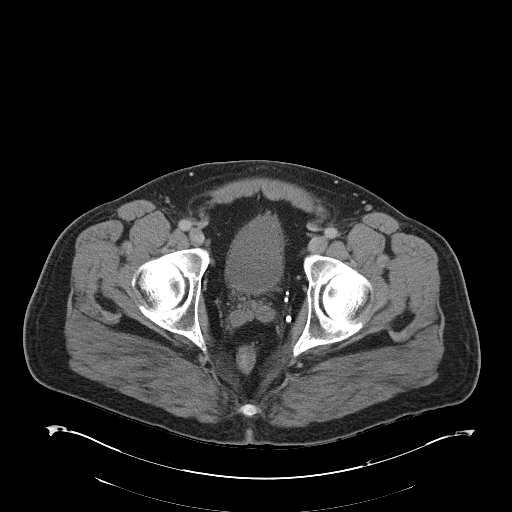
[im 29/104  soft-tissue]
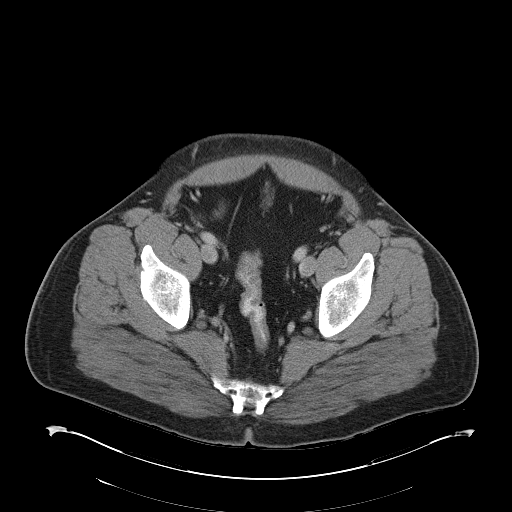
[im 35/104  soft-tissue]
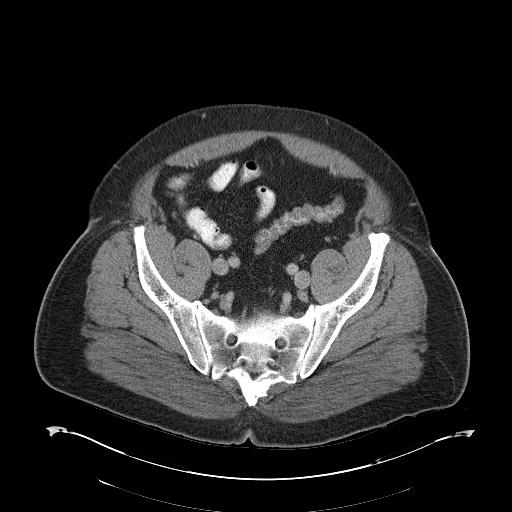
[im 46/104  soft-tissue]
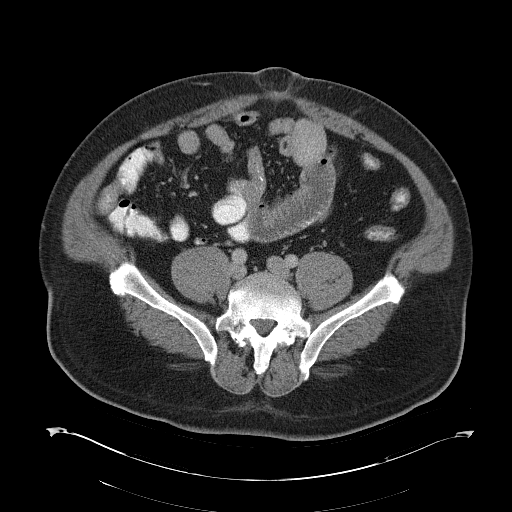
[im 52/104  soft-tissue]
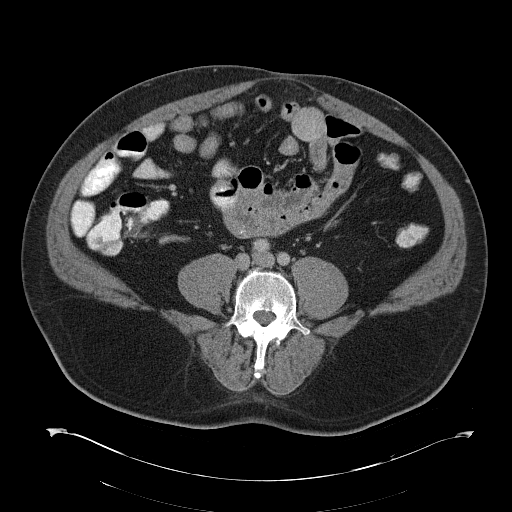
[im 58/104  soft-tissue]
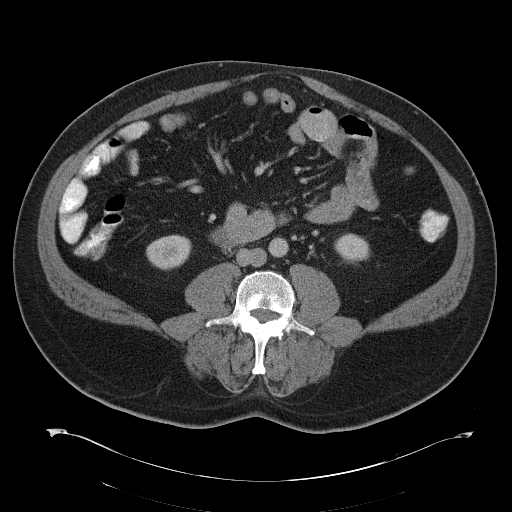
[im 69/104  soft-tissue]
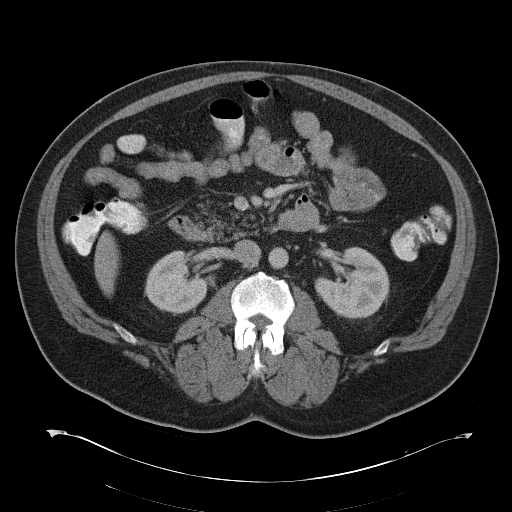
[im 69/104  bone]
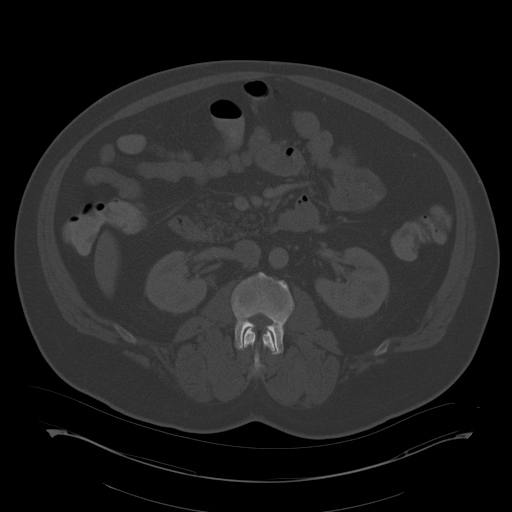
[im 75/104  soft-tissue]
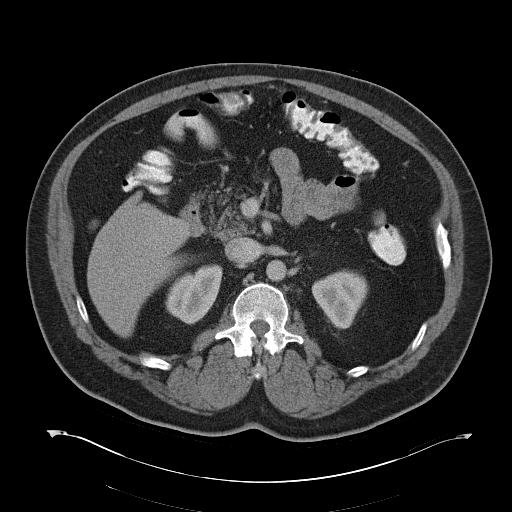
[im 81/104  soft-tissue]
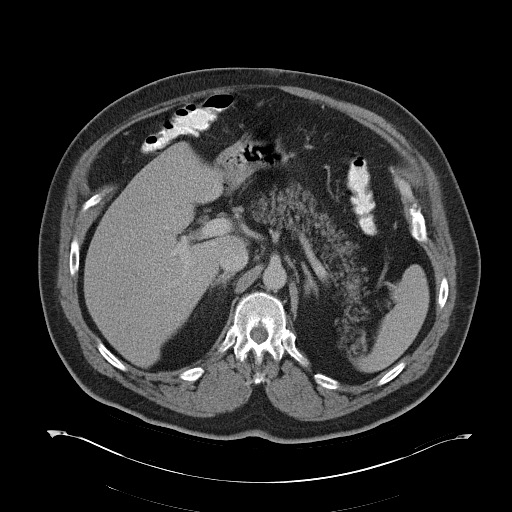
[im 92/104  soft-tissue]
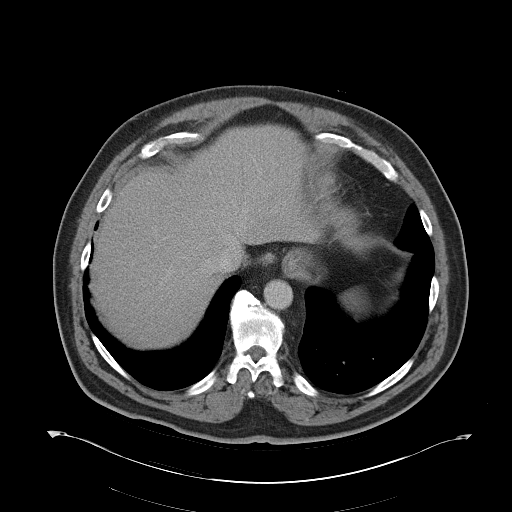
[im 98/104  soft-tissue]
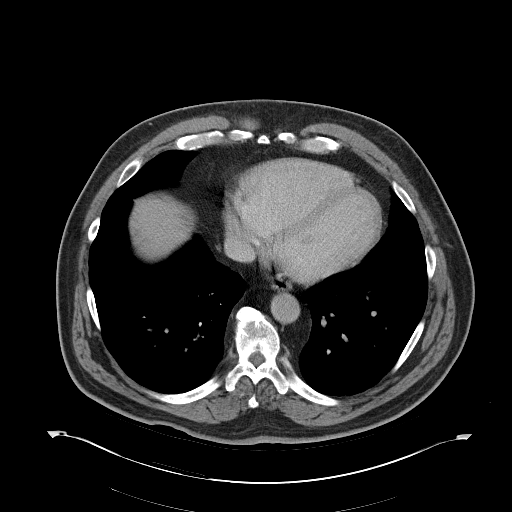

[Series 5: mpr cor post contrast (id) · coronal · 0.81mm/px · 3 of 109 slices shown]
[im 37/109  soft-tissue]
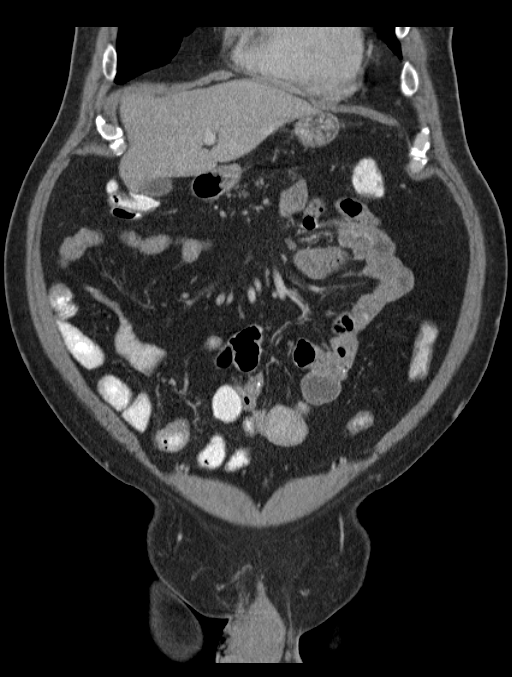
[im 49/109  soft-tissue]
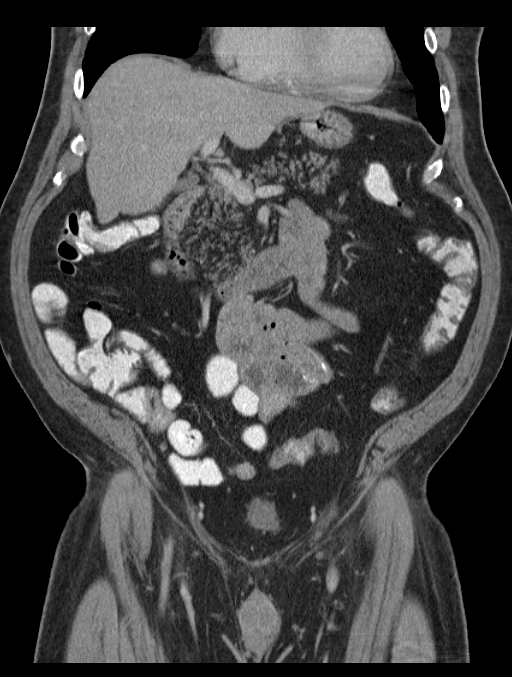
[im 61/109  soft-tissue]
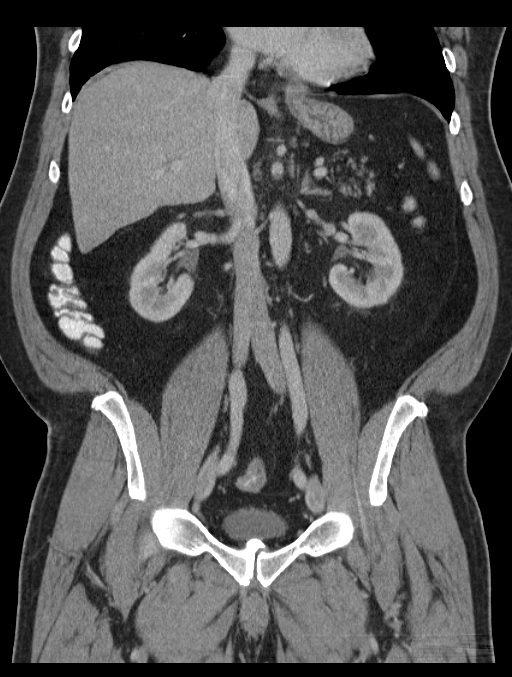

[16 of 46 positions shown; findings below may reference images not displayed]

FINDINGS: Lower chest: Volume loss in the posterior right middle lobe, likely
related to scarring. There is also minimal volume loss in the left
lung base. Mild cardiomegaly, without pericardial or pleural
effusion.

Hepatobiliary: Normal liver. Normal gallbladder, without biliary
ductal dilatation.

Pancreas: Fatty replacement throughout the pancreas, without duct
dilatation, mass, or acute inflammation.

Spleen: Normal in size, without focal abnormality.

Adrenals/Urinary Tract: Normal adrenal glands. Normal kidneys,
without hydronephrosis. Normal urinary bladder.

Stomach/Bowel: Normal stomach, without wall thickening. Normal
colon, appendix, and terminal ileum. Normal small bowel.

Vascular/Lymphatic: Normal caliber of the aorta and branch vessels.
No abdominopelvic adenopathy.

Reproductive: Normal prostate.

Other: No significant free fluid. Tiny fat containing ventral
abdominal wall hernia.

Musculoskeletal: Degenerative partial fusion of the bilateral
sacroiliac joints. Remote posterior right twelfth rib trauma. Mild
disc bulge at L4-5.
IMPRESSION: No acute process in the abdomen or pelvis.

## 2017-08-27 ENCOUNTER — Other Ambulatory Visit: Payer: Self-pay | Admitting: Family Medicine

## 2017-08-27 DIAGNOSIS — R609 Edema, unspecified: Secondary | ICD-10-CM

## 2017-08-30 ENCOUNTER — Encounter: Payer: Self-pay | Admitting: Family Medicine

## 2017-08-30 ENCOUNTER — Ambulatory Visit (INDEPENDENT_AMBULATORY_CARE_PROVIDER_SITE_OTHER): Payer: BC Managed Care – PPO | Admitting: Family Medicine

## 2017-08-30 VITALS — BP 140/83 | HR 65 | Temp 97.5°F | Ht 72.0 in | Wt 248.2 lb

## 2017-08-30 DIAGNOSIS — R0789 Other chest pain: Secondary | ICD-10-CM | POA: Diagnosis not present

## 2017-08-30 MED ORDER — PREDNISONE 10 MG (48) PO TBPK: ORAL_TABLET | ORAL | 0 refills | Status: DC

## 2017-08-30 NOTE — Progress Notes (Signed)
Chief Complaint  Patient presents with  . pulled muscle?    x 1 week. Patient states when he lifts his right arm he gets pressure right side of chest. States it feels like a bruised muscle.    HPI  Patient presents today for increasing right chest pain.  It hurts worse when he abducts the right shoulder.  It hurts for pressure being applied.  He says it feels like someone punched him there, however there has been no known injury.  He is not short of breath.  It does not radiate.  It is nonexertional. PMH: Smoking status noted ROS: Per HPI  Objective: BP 140/83   Pulse 65   Temp (!) 97.5 F (36.4 C) (Oral)   Ht 6' (1.829 m)   Wt 248 lb 3.2 oz (112.6 kg)   BMI 33.66 kg/m  Gen: NAD, alert, cooperative with exam HEENT: NCAT, EOMI, PERRL CV: RRR, good S1/S2, no murmur.  Point tender at the right chest wall, medial clavicular line about rib 6.  There is a slight nodularity overlying the rib Resp: CTABL, no wheezes, non-labored  Ext: No edema, warm Neuro: Alert and oriented, No gross deficits  Assessment and plan:  1. Chest wall pain     Meds ordered this encounter  Medications  . predniSONE (STERAPRED UNI-PAK 48 TAB) 10 MG (48) TBPK tablet    Sig: Use as directed    Dispense:  48 tablet    Refill:  0      Follow up 1 week if not better Mechele ClaudeWarren Lummie Montijo, MD

## 2017-10-01 ENCOUNTER — Encounter: Payer: Self-pay | Admitting: Family Medicine

## 2017-10-01 ENCOUNTER — Ambulatory Visit: Payer: BC Managed Care – PPO | Admitting: Physician Assistant

## 2017-10-01 ENCOUNTER — Ambulatory Visit (INDEPENDENT_AMBULATORY_CARE_PROVIDER_SITE_OTHER): Payer: BC Managed Care – PPO | Admitting: Family Medicine

## 2017-10-01 ENCOUNTER — Ambulatory Visit (INDEPENDENT_AMBULATORY_CARE_PROVIDER_SITE_OTHER): Payer: BC Managed Care – PPO

## 2017-10-01 VITALS — BP 138/79 | HR 54 | Temp 97.0°F | Ht 72.0 in | Wt 252.6 lb

## 2017-10-01 DIAGNOSIS — R0789 Other chest pain: Secondary | ICD-10-CM

## 2017-10-01 MED ORDER — OMEPRAZOLE 20 MG PO CPDR: 20.0000 mg | DELAYED_RELEASE_CAPSULE | Freq: Every day | ORAL | 3 refills | Status: DC

## 2017-10-01 NOTE — Progress Notes (Signed)
BP 138/79   Pulse (!) 54   Temp (!) 97 F (36.1 C) (Oral)   Ht 6' (1.829 m)   Wt 252 lb 9.6 oz (114.6 kg)   BMI 34.26 kg/m    Subjective:    Patient ID: Tyler Burnett, male    DOB: Nov 01, 1955, 62 y.o.   MRN: [id number]  HPI: Tyler Burnett is a 62 y.o. male presenting on 10/01/2017 for right sided chest wall pain (Patient was seen 7/1 by Dr. Livia Snellen and patient states it is no better. Dull constant pain until moving then the pain becomes sharpe.)   HPI Reproducible chest wall pain Patient is coming in complaining of continued reproducible chest wall pain.  He was seen 1 month ago by 1 of my colleagues and treated for chest wall pain with some prednisone which he said did help initially but then it became worse again and may be been worse than it was before.  He says that the chest wall pain is dull and achy all the time but becomes very sharp with movement at night.  He says during the day he has not noticed too many issues with movement but mainly at nighttime.  Patient denies any cough or wheezing or chest congestion.  He does say he has some epigastric abdominal discomfort but is not as severe as where the chest pain is.  Chest pain is right-sided around his fourth and fifth ribs near his right midclavicular line.  Patient cannot recall any traumatic event that brought this up on him.  Pain hurts it diet with more range of motion especially range of motion of that right shoulder  Relevant past medical, surgical, family and social history reviewed and updated as indicated. Interim medical history since our last visit reviewed. Allergies and medications reviewed and updated.  Review of Systems  Constitutional: Negative for chills and fever.  HENT: Negative for congestion.   Respiratory: Negative for cough, shortness of breath and wheezing.   Cardiovascular: Negative for chest pain, palpitations and leg swelling.  Gastrointestinal: Positive for abdominal pain. Negative for constipation,  diarrhea, nausea and vomiting.  Musculoskeletal: Positive for myalgias. Negative for back pain and gait problem.  Skin: Negative for color change and rash.  All other systems reviewed and are negative.   Per HPI unless specifically indicated above   Allergies as of 10/01/2017      Reactions   Doxycycline Other (See Comments)   Abdominal pain      Medication List        Accurate as of 10/01/17  9:31 AM. Always use your most recent med list.          amLODipine 10 MG tablet Commonly known as:  NORVASC TAKE 1 TABLET(10 MG) BY MOUTH DAILY FOR BLOOD PRESSURE   atorvastatin 40 MG tablet Commonly known as:  LIPITOR Take 1 tablet (40 mg total) by mouth daily.   blood glucose meter kit and supplies Kit Dispense based on patient and insurance preference. Use up to four times daily as directed. (FOR ICD-9 250.00, 250.01).   furosemide 20 MG tablet Commonly known as:  LASIX Take 1 tablet (20 mg total) by mouth daily.   lisinopril 20 MG tablet Commonly known as:  PRINIVIL,ZESTRIL Take 1 tablet (20 mg total) by mouth daily.   metFORMIN 1000 MG tablet Commonly known as:  GLUCOPHAGE TAKE 1 TABLET(1000 MG) BY MOUTH TWICE DAILY WITH A MEAL          Objective:    BP  138/79   Pulse (!) 54   Temp (!) 97 F (36.1 C) (Oral)   Ht 6' (1.829 m)   Wt 252 lb 9.6 oz (114.6 kg)   BMI 34.26 kg/m   Wt Readings from Last 3 Encounters:  10/01/17 252 lb 9.6 oz (114.6 kg)  08/30/17 248 lb 3.2 oz (112.6 kg)  08/17/17 251 lb (113.9 kg)    Physical Exam  Constitutional: He is oriented to person, place, and time. He appears well-developed and well-nourished. No distress.  Eyes: Conjunctivae are normal. No scleral icterus.  Cardiovascular: Normal rate, regular rhythm, normal heart sounds and intact distal pulses.  No murmur heard. Pulmonary/Chest: Effort normal and breath sounds normal. No respiratory distress. He has no wheezes. He exhibits tenderness (Right sided mid clavicular line  anterior chest wall pain near the fifth rib).  Abdominal: Soft. Bowel sounds are normal. He exhibits no distension and no mass. There is tenderness (Epigastric abdominal tenderness). There is no guarding.  Neurological: He is alert and oriented to person, place, and time. Coordination normal.  Skin: Skin is warm and dry. No rash noted. He is not diaphoretic.  Psychiatric: He has a normal mood and affect. His behavior is normal.  Nursing note and vitals reviewed.  Chest x-ray: No acute cardiopulmonary  or bony abnormalities noted    Assessment & Plan:   Problem List Items Addressed This Visit    None    Visit Diagnoses    Chest wall pain    -  Primary   Relevant Orders   DG Chest 2 View      Concern for possible intercostal muscle strain but also some concern that he has GERD symptoms, especially since it only flares up at night when he is laying down.  We will treat for GERD with omeprazole and see if it improves.  Follow up plan: Return in about 1 month (around 10/29/2017), or if symptoms worsen or fail to improve, for Follow-up chest wall pain with PCP.  Counseling provided for all of the vaccine components Orders Placed This Encounter  Procedures  . DG Chest 2 View    Caryl Pina, MD [facility] 10/01/2017, 9:31 AM

## 2017-11-17 ENCOUNTER — Ambulatory Visit: Payer: BC Managed Care – PPO | Admitting: Family Medicine

## 2018-01-28 ENCOUNTER — Ambulatory Visit: Payer: BC Managed Care – PPO | Admitting: Family

## 2018-01-28 ENCOUNTER — Encounter: Payer: Self-pay | Admitting: Family

## 2018-01-28 VITALS — BP 142/76 | HR 60 | Temp 98.3°F | Ht 72.0 in | Wt 261.0 lb

## 2018-01-28 DIAGNOSIS — R109 Unspecified abdominal pain: Secondary | ICD-10-CM | POA: Diagnosis not present

## 2018-01-28 DIAGNOSIS — Z23 Encounter for immunization: Secondary | ICD-10-CM | POA: Diagnosis not present

## 2018-01-28 LAB — URINALYSIS, COMPLETE
Bilirubin, UA: NEGATIVE
Glucose, UA: NEGATIVE
Ketones, UA: NEGATIVE
LEUKOCYTES UA: NEGATIVE
Nitrite, UA: NEGATIVE
PH UA: 5.5 (ref 5.0–7.5)
Protein, UA: NEGATIVE
RBC, UA: NEGATIVE
Specific Gravity, UA: 1.015 (ref 1.005–1.030)
UUROB: 0.2 mg/dL (ref 0.2–1.0)

## 2018-01-28 LAB — MICROSCOPIC EXAMINATION
Bacteria, UA: NONE SEEN
Epithelial Cells (non renal): NONE SEEN /hpf (ref 0–10)
RBC, UA: NONE SEEN /hpf (ref 0–2)
RENAL EPITHEL UA: NONE SEEN /HPF
WBC, UA: NONE SEEN /hpf (ref 0–5)

## 2018-01-28 MED ORDER — NAPROXEN 500 MG PO TABS: 500.0000 mg | ORAL_TABLET | Freq: Two times a day (BID) | ORAL | 1 refills | Status: DC

## 2018-01-28 MED ORDER — BACLOFEN 10 MG PO TABS: 10.0000 mg | ORAL_TABLET | Freq: Three times a day (TID) | ORAL | 0 refills | Status: DC

## 2018-01-28 NOTE — Progress Notes (Signed)
   Subjective:    Patient ID: Tyler Burnett, male    DOB: 07-Apr-1955, 62 y.o.   MRN: [id number]  Chief Complaint  Patient presents with  . Flank Pain    Flank Pain  This is a new problem. The current episode started 1 to 4 weeks ago. The problem occurs intermittently (at night). The problem has been waxing and waning since onset. Pain location: right flank pain. The pain is at a severity of 10/10. The pain is moderate. The symptoms are aggravated by lying down. Pertinent negatives include no bladder incontinence, bowel incontinence, dysuria, weakness or weight loss. He has tried ice and NSAIDs for the symptoms. The treatment provided no relief.      Review of Systems  Constitutional: Negative for weight loss.  Gastrointestinal: Negative for bowel incontinence.  Genitourinary: Positive for flank pain. Negative for bladder incontinence and dysuria.  Neurological: Negative for weakness.  All other systems reviewed and are negative.      Objective:   Physical Exam  Constitutional: He is oriented to person, place, and time. He appears well-developed and well-nourished. No distress.  HENT:  Head: Normocephalic.  Eyes: Pupils are equal, round, and reactive to light. Right eye exhibits no discharge. Left eye exhibits no discharge.  Neck: Normal range of motion. Neck supple. No thyromegaly present.  Cardiovascular: Normal rate, regular rhythm, normal heart sounds and intact distal pulses.  No murmur heard. Pulmonary/Chest: Effort normal and breath sounds normal. No respiratory distress. He has no wheezes.  Abdominal: Soft. Bowel sounds are normal. He exhibits no distension. There is tenderness (mild RUQ  ) in the right upper quadrant. A hernia is present.  Musculoskeletal: He exhibits no edema or tenderness.  Full ROM, negative CVA tenderness  Neurological: He is alert and oriented to person, place, and time. He has normal reflexes. No cranial nerve deficit.  Skin: Skin is warm and dry.  No rash noted. No erythema.  Psychiatric: He has a normal mood and affect. His behavior is normal. Judgment and thought content normal.  Vitals reviewed.     BP (!) 142/76   Pulse 60   Temp 98.3 F (36.8 C) (Oral)   Ht 6' (1.829 m)   Wt 261 lb (118.4 kg)   BMI 35.40 kg/m      Assessment & Plan:  Tyler Burnett comes in today with chief complaint of Flank Pain   Diagnosis and orders addressed:  1. Right flank pain Urine negative Will treat as MSK as pain is only when laying at night Will do CBC to rule out infection If pain continues will need xray - Urinalysis, Complete - naproxen (NAPROSYN) 500 MG tablet; Take 1 tablet (500 mg total) by mouth 2 (two) times daily with a meal.  Dispense: 60 tablet; Refill: 1 - baclofen (LIORESAL) 10 MG tablet; Take 1 tablet (10 mg total) by mouth 3 (three) times daily.  Dispense: 30 each; Refill: 0 - BMP8+EGFR - CBC with Differential/Platelet - Lipase   Evelina Dun, FNP

## 2018-01-28 NOTE — Patient Instructions (Signed)
Flank Pain, Adult Flank pain is pain that is located on the side of the body between the upper abdomen and the back. This area is called the flank. The pain may occur over a short period of time (acute), or it may be long-term or recurring (chronic). It may be mild or severe. Flank pain can be caused by many things, including:  Muscle soreness or injury.  Kidney stones or kidney disease.  Stress.  A disease of the spine (vertebral disk disease).  A lung infection (pneumonia).  Fluid around the lungs (pulmonary edema).  A skin rash caused by the chickenpox virus (shingles).  Tumors that affect the back of the abdomen.  Gallbladder disease.  Follow these instructions at home:  Drink enough fluid to keep your urine clear or pale yellow.  Rest as told by your health care provider.  Take over-the-counter and prescription medicines only as told by your health care provider.  Keep a journal to track what has caused your flank pain and what has made it feel better.  Keep all follow-up visits as told by your health care provider. This is important. Contact a health care provider if:  Your pain is not controlled with medicine.  You have new symptoms.  Your pain gets worse.  You have a fever.  Your symptoms last longer than 2-3 days.  You have trouble urinating or you are urinating very frequently. Get help right away if:  You have trouble breathing or you are short of breath.  Your abdomen hurts or it is swollen or red.  You have nausea or vomiting.  You feel faint or you pass out.  You have blood in your urine. Summary  Flank pain is pain that is located on the side of the body between the upper abdomen and the back.  The pain may occur over a short period of time (acute), or it may be long-term or recurring (chronic). It may be mild or severe.  Flank pain can be caused by many things.  Contact your health care provider if your symptoms get worse or they last  longer than 2-3 days. This information is not intended to replace advice given to you by your health care provider. Make sure you discuss any questions you have with your health care provider. Document Released: 04/09/2005 Document Revised: 05/01/2016 Document Reviewed: 05/01/2016 Elsevier Interactive Patient Education  2018 [employer]  

## 2018-01-29 LAB — CBC WITH DIFFERENTIAL/PLATELET
BASOS: 0 %
Basophils Absolute: 0 10*3/uL (ref 0.0–0.2)
EOS (ABSOLUTE): 0.3 10*3/uL (ref 0.0–0.4)
EOS: 5 %
HEMATOCRIT: 49.2 % (ref 37.5–51.0)
HEMOGLOBIN: 16.3 g/dL (ref 13.0–17.7)
IMMATURE GRANS (ABS): 0 10*3/uL (ref 0.0–0.1)
Immature Granulocytes: 0 %
LYMPHS ABS: 2.4 10*3/uL (ref 0.7–3.1)
LYMPHS: 48 %
MCH: 28.9 pg (ref 26.6–33.0)
MCHC: 33.1 g/dL (ref 31.5–35.7)
MCV: 87 fL (ref 79–97)
MONOCYTES: 8 %
Monocytes Absolute: 0.4 10*3/uL (ref 0.1–0.9)
NEUTROS ABS: 1.9 10*3/uL (ref 1.4–7.0)
Neutrophils: 39 %
Platelets: 270 10*3/uL (ref 150–450)
RBC: 5.64 x10E6/uL (ref 4.14–5.80)
RDW: 12.6 % (ref 12.3–15.4)
WBC: 5 10*3/uL (ref 3.4–10.8)

## 2018-01-29 LAB — LIPASE: Lipase: 35 U/L (ref 13–78)

## 2018-01-29 LAB — BMP8+EGFR
BUN / CREAT RATIO: 8 — AB (ref 10–24)
BUN: 7 mg/dL — AB (ref 8–27)
CALCIUM: 9.5 mg/dL (ref 8.6–10.2)
CHLORIDE: 96 mmol/L (ref 96–106)
CO2: 25 mmol/L (ref 20–29)
Creatinine, Ser: 0.91 mg/dL (ref 0.76–1.27)
GFR calc Af Amer: 105 mL/min/{1.73_m2} (ref >59)
GFR, EST NON AFRICAN AMERICAN: 91 mL/min/{1.73_m2} (ref >59)
Glucose: 159 mg/dL — ABNORMAL HIGH (ref 65–99)
Potassium: 4.7 mmol/L (ref 3.5–5.2)
Sodium: 138 mmol/L (ref 134–144)

## 2018-03-23 ENCOUNTER — Encounter: Payer: Self-pay | Admitting: Family Medicine

## 2018-03-23 ENCOUNTER — Ambulatory Visit: Payer: BC Managed Care – PPO | Admitting: Family Medicine

## 2018-03-23 VITALS — BP 175/82 | HR 62 | Temp 98.1°F | Ht 72.0 in | Wt 261.0 lb

## 2018-03-23 DIAGNOSIS — J069 Acute upper respiratory infection, unspecified: Secondary | ICD-10-CM | POA: Diagnosis not present

## 2018-03-23 DIAGNOSIS — B9689 Other specified bacterial agents as the cause of diseases classified elsewhere: Secondary | ICD-10-CM

## 2018-03-23 MED ORDER — AMOXICILLIN-POT CLAVULANATE 875-125 MG PO TABS: 1.0000 | ORAL_TABLET | Freq: Two times a day (BID) | ORAL | 0 refills | Status: AC

## 2018-03-23 NOTE — Progress Notes (Signed)
    Subjective:     Tyler Burnett is a 63 y.o. male who presents for evaluation of symptoms of a URI. Symptoms include achiness, congestion, cough described as productive, low grade fever, nasal congestion, post nasal drip, productive cough with  white, gray and yellow colored sputum, purulent nasal discharge and shortness of breath. Onset of symptoms was 2 weeks ago, and has been gradually worsening since that time. Treatment to date: antihistamines, cough suppressants and decongestants. Treatments provided minimal relief.   The following portions of the patient's history were reviewed and updated as appropriate: allergies, current medications, past family history, past medical history, past social history, past surgical history and problem list.  Review of Systems Constitutional: positive for chills, fatigue, fevers and malaise Eyes: negative Ears, nose, mouth, throat, and face: positive for nasal congestion and sore throat Respiratory: positive for cough, dyspnea on exertion and sputum Cardiovascular: negative Gastrointestinal: negative Musculoskeletal:positive for myalgias Neurological: negative   Objective:    BP (!) 175/82   Pulse 62   Temp 98.1 F (36.7 C) (Oral)   Ht 6' (1.829 m)   Wt 261 lb (118.4 kg)   SpO2 98%   BMI 35.40 kg/m  General appearance: alert, cooperative and mild distress Head: Normocephalic, without obvious abnormality, atraumatic Eyes: negative Ears: abnormal TM right ear - serous middle ear fluid and abnormal TM left ear - serous middle ear fluid Nose: green and scant discharge, moderate congestion, turbinates red, swollen, no sinus tenderness Throat: abnormal findings: moderate oropharyngeal erythema Neck: mild anterior cervical adenopathy, no carotid bruit, no JVD, supple, symmetrical, trachea midline and thyroid not enlarged, symmetric, no tenderness/mass/nodules Lungs: wheezes posterior - bilateral, scattered, mild Heart: regular rate and rhythm, S1,  S2 normal, no murmur, click, rub or gallop Skin: Skin color, texture, turgor normal. No rashes or lesions Lymph nodes: Cervical, supraclavicular, and axillary nodes normal. Neurologic: Grossly normal   Assessment:    Tyler Burnett was seen today for chest congestion, cough, shortness of breath.  Diagnoses and all orders for this visit:  Bacterial URI Increase fluid intake. Increase humidity in the air. Medications as prescribe. Report any new or worsening symptoms.  -     amoxicillin-clavulanate (AUGMENTIN) 875-125 MG tablet; Take 1 tablet by mouth 2 (two) times daily for 7 days.  Blood pressure elevated in office today. Pt has been taking over the counter could and cough medications. Education provided on medications to avoid and medications that are safe for elevated blood pressure. Pt to monitor BP at home and follow up with PCP.  Plan:    Discussed diagnosis and treatment of URI. Suggested symptomatic OTC remedies. Nasal saline spray for congestion. Augmentin per orders. Follow up as needed. Call in 4 days if symptoms aren't resolving. Follow up with PCP in 2 weeks or as needed.    The above assessment and management plan was discussed with the patient. The patient verbalized understanding of and has agreed to the management plan. Patient is aware to call the clinic if symptoms fail to improve or worsen. Patient is aware when to return to the clinic for a follow-up visit. Patient educated on when it is appropriate to go to the emergency department.   Kari Baars, FNP-C [facility] [address] [place], [place] [address] [phone]

## 2018-03-23 NOTE — Patient Instructions (Signed)

## 2018-04-18 IMAGING — DX DG CHEST 2V
2 series · 2 of 2 positions shown · non-contrast
Comparison: March 12, 2011

CLINICAL DATA: Cough and congestion

EXAM:
CHEST  2 VIEW

[chest pa]
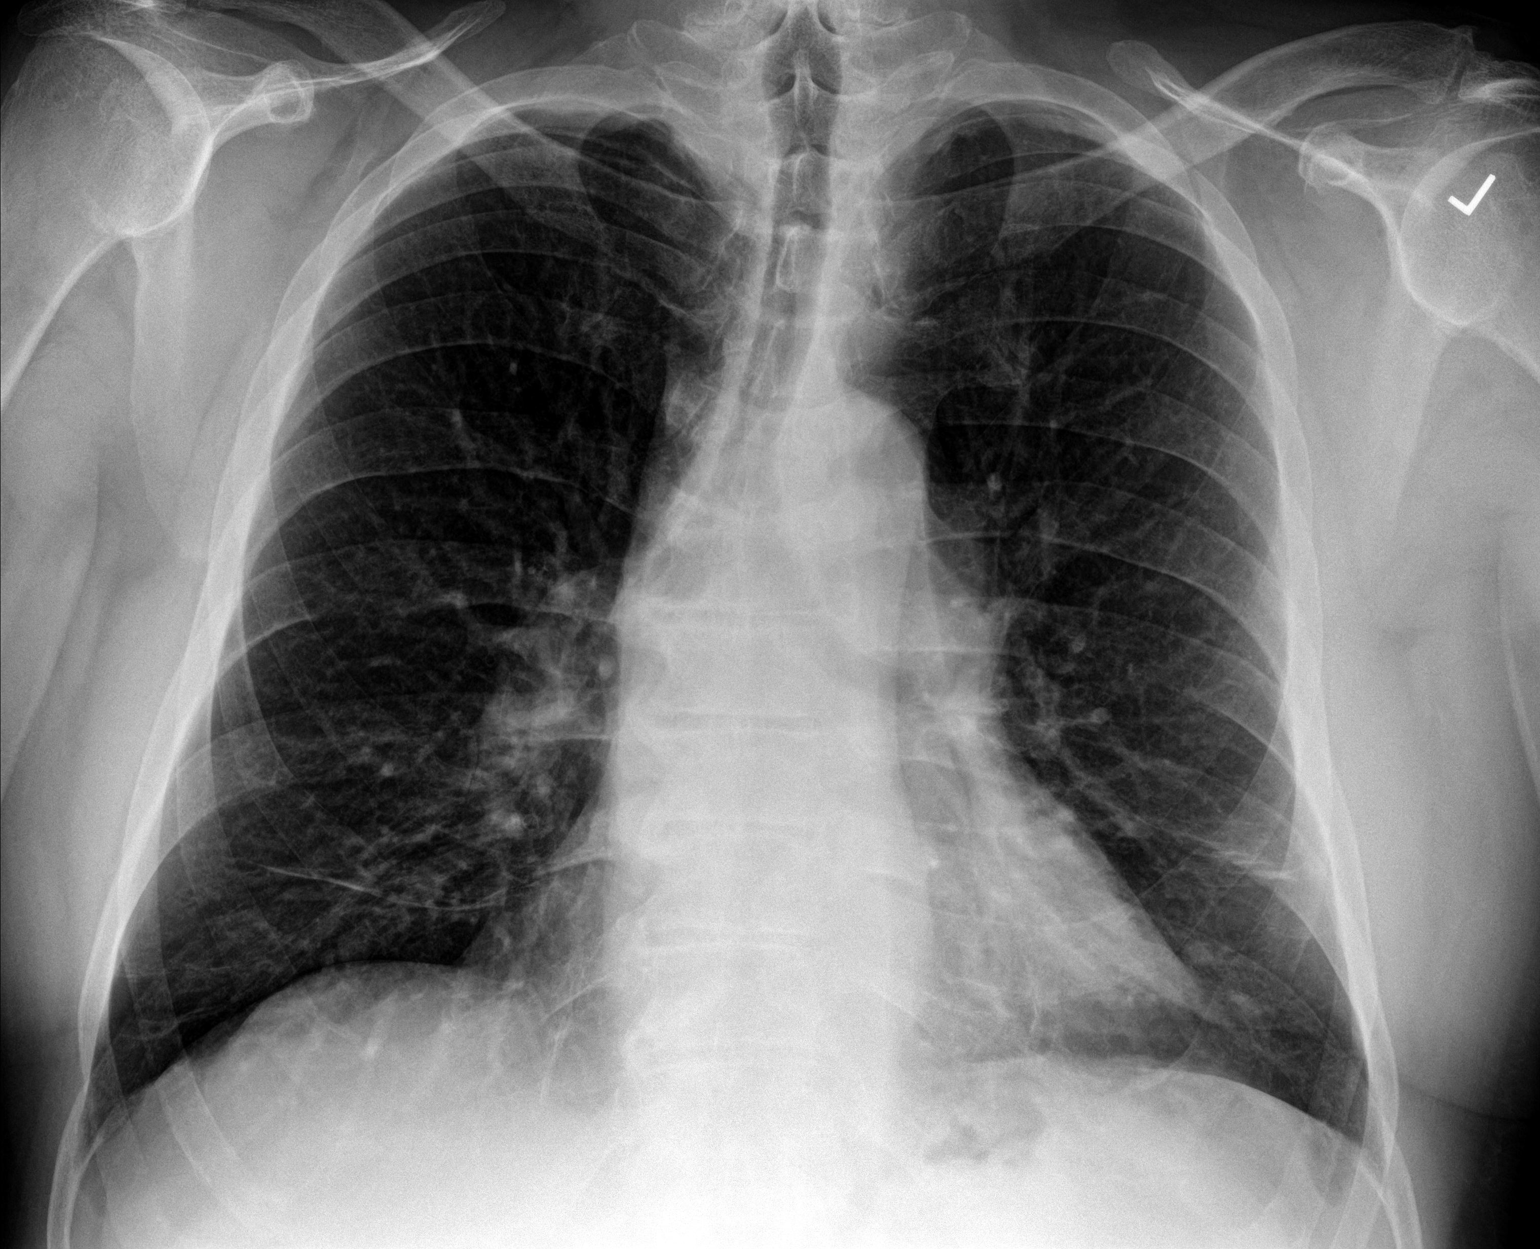

[chest lat]
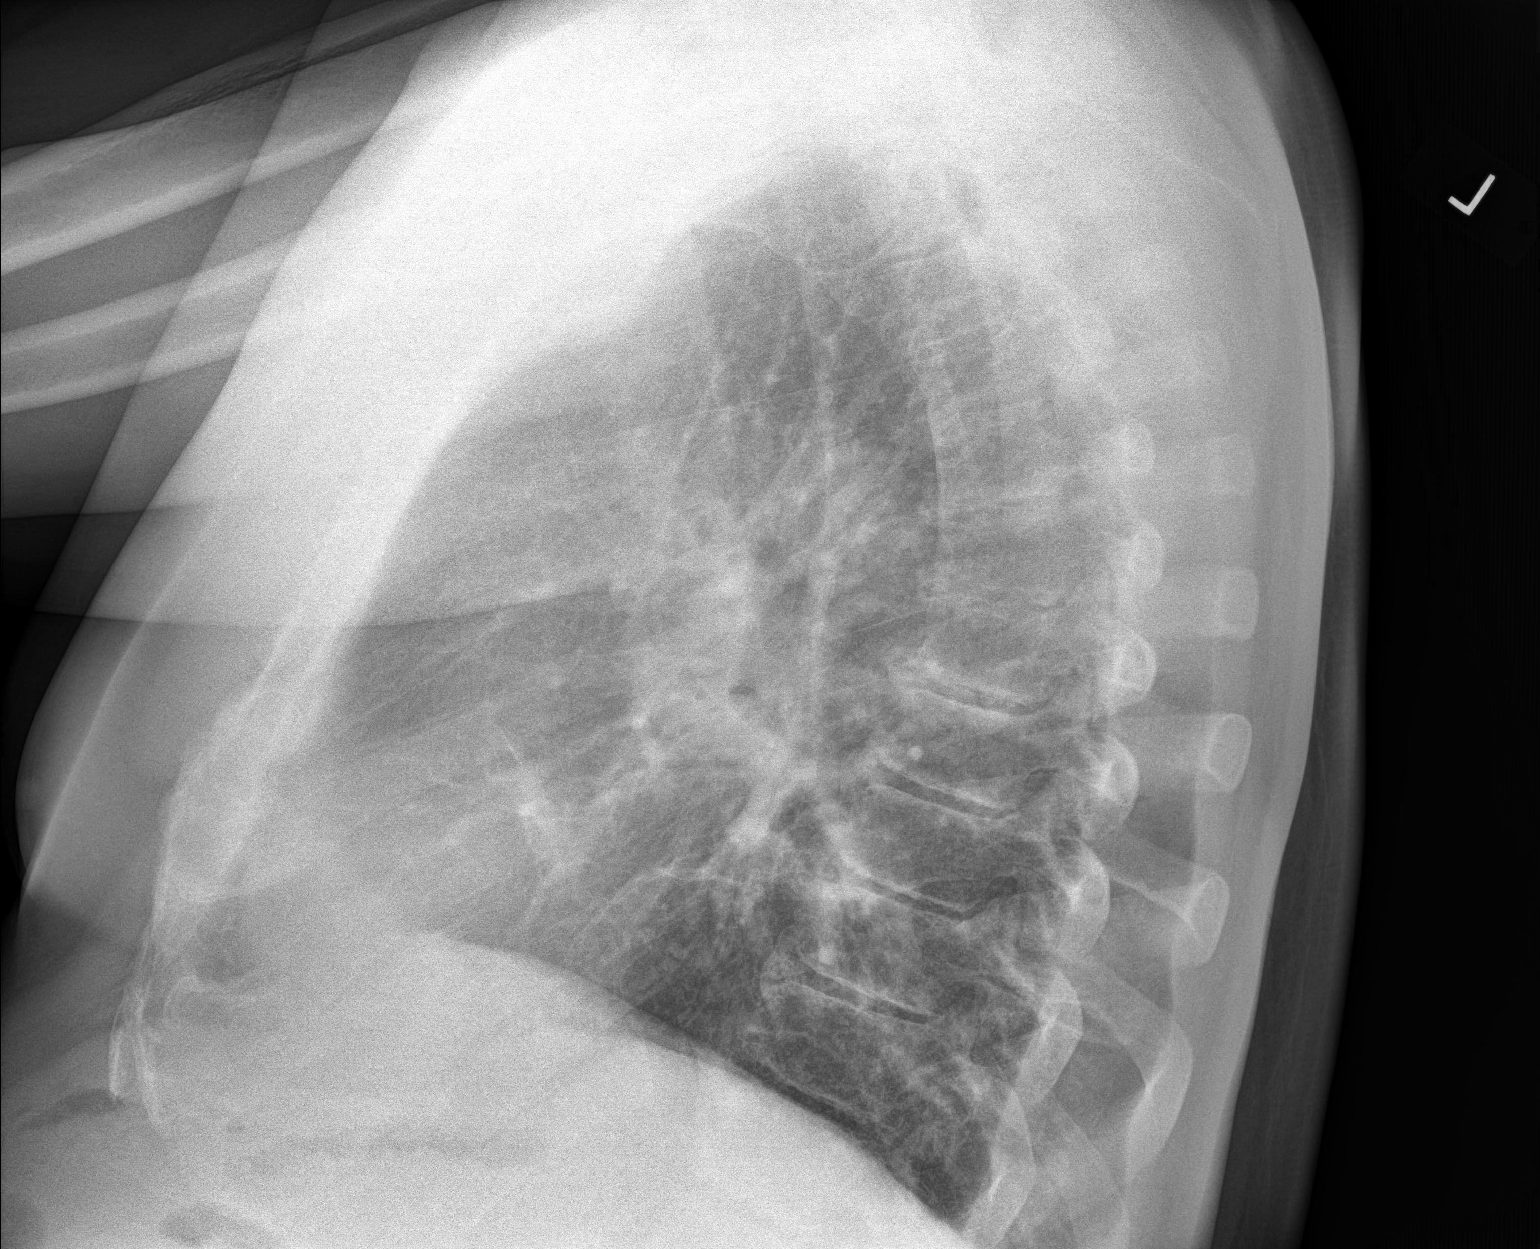

[2 of 2 positions shown; findings below may reference images not displayed]

FINDINGS: There is mild bibasilar scarring. There is no edema or
consolidation. Heart size and pulmonary vascularity are normal. No
adenopathy. There is mild degenerative change in the thoracic spine.
IMPRESSION: Stable bibasilar scarring.  No edema or consolidation.

## 2018-05-23 ENCOUNTER — Other Ambulatory Visit: Payer: Self-pay | Admitting: Pediatrics

## 2018-05-23 ENCOUNTER — Other Ambulatory Visit: Payer: Self-pay | Admitting: Family Medicine

## 2018-05-23 DIAGNOSIS — R609 Edema, unspecified: Secondary | ICD-10-CM

## 2018-07-06 ENCOUNTER — Ambulatory Visit: Payer: BC Managed Care – PPO | Admitting: Family Medicine

## 2018-07-06 ENCOUNTER — Encounter: Payer: Self-pay | Admitting: Family Medicine

## 2018-07-06 ENCOUNTER — Encounter: Payer: Self-pay | Admitting: *Deleted

## 2018-07-06 ENCOUNTER — Other Ambulatory Visit: Payer: Self-pay

## 2018-07-06 VITALS — BP 128/76 | HR 65 | Temp 98.0°F | Ht 72.0 in | Wt 254.0 lb

## 2018-07-06 DIAGNOSIS — E782 Mixed hyperlipidemia: Secondary | ICD-10-CM | POA: Diagnosis not present

## 2018-07-06 DIAGNOSIS — E119 Type 2 diabetes mellitus without complications: Secondary | ICD-10-CM | POA: Diagnosis not present

## 2018-07-06 DIAGNOSIS — N419 Inflammatory disease of prostate, unspecified: Secondary | ICD-10-CM | POA: Diagnosis not present

## 2018-07-06 DIAGNOSIS — I10 Essential (primary) hypertension: Secondary | ICD-10-CM | POA: Diagnosis not present

## 2018-07-06 DIAGNOSIS — R609 Edema, unspecified: Secondary | ICD-10-CM

## 2018-07-06 LAB — URINALYSIS
Bilirubin, UA: NEGATIVE
Glucose, UA: NEGATIVE
Ketones, UA: NEGATIVE
Leukocytes,UA: NEGATIVE
Nitrite, UA: NEGATIVE
Protein,UA: NEGATIVE
RBC, UA: NEGATIVE
Specific Gravity, UA: 1.015 (ref 1.005–1.030)
Urobilinogen, Ur: 1 mg/dL (ref 0.2–1.0)
pH, UA: 6.5 (ref 5.0–7.5)

## 2018-07-06 LAB — BAYER DCA HB A1C WAIVED: HB A1C (BAYER DCA - WAIVED): 9 % — ABNORMAL HIGH (ref <7.0)

## 2018-07-06 MED ORDER — AMLODIPINE BESYLATE 10 MG PO TABS: ORAL_TABLET | ORAL | 0 refills | Status: DC

## 2018-07-06 MED ORDER — CIPROFLOXACIN HCL 500 MG PO TABS: 500.0000 mg | ORAL_TABLET | Freq: Two times a day (BID) | ORAL | 0 refills | Status: DC

## 2018-07-06 MED ORDER — ATORVASTATIN CALCIUM 40 MG PO TABS: ORAL_TABLET | ORAL | 0 refills | Status: DC

## 2018-07-06 MED ORDER — FUROSEMIDE 20 MG PO TABS: ORAL_TABLET | ORAL | 0 refills | Status: DC

## 2018-07-06 MED ORDER — METFORMIN HCL 1000 MG PO TABS: 1000.0000 mg | ORAL_TABLET | Freq: Two times a day (BID) | ORAL | 0 refills | Status: DC

## 2018-07-06 NOTE — Progress Notes (Signed)
Subjective:  Patient ID: Tyler Burnett, male    DOB: 08-Sep-1955  Age: 63 y.o. MRN: [id number]  CC: Diabetes (3 MO)   HPI Tyler Burnett presents forFollow-up of diabetes. Patient does not blood sugar at home.   Patient denies symptoms, polydipsia, excessive hunger, nausea. Tyler Burnett iHs having polyuria and suprapubic pain. No back pain. Tyler Burnett is up to the bathroom to urinate three times a night. Daytime estimate is 10 episodes a day.The suprapubic pain  Has been 10/10 the last two days. So bad two days ago Tyler Burnett could not walk. It is better today. No significant hypoglycemic spells noted. Medications reviewed. Pt reports taking them regularly without complication/adverse reaction being reported today.     History Tyler Burnett has a past medical history of Diabetes mellitus without complication ([place]) and Hypertension.   Tyler Burnett has a past surgical history that includes Fracture surgery and arm surgery (1980).   His family history includes Aneurysm in his mother; Heart disease in his father.Tyler Burnett reports that Tyler Burnett has never smoked. Tyler Burnett has never used smokeless tobacco. Tyler Burnett reports that Tyler Burnett does not drink alcohol or use drugs.  Current Outpatient Medications on File Prior to Visit  Medication Sig Dispense Refill  . blood glucose meter kit and supplies KIT Dispense based on patient and insurance preference. Use up to four times daily as directed. (FOR ICD-9 250.00, 250.01). 1 each 0   No current facility-administered medications on file prior to visit.     ROS Review of Systems  Constitutional: Negative.   HENT: Negative.   Eyes: Negative for visual disturbance.  Respiratory: Negative for cough and shortness of breath.   Cardiovascular: Negative for chest pain and leg swelling.  Gastrointestinal: Positive for abdominal pain. Negative for diarrhea, nausea and vomiting.  Genitourinary: Positive for frequency and urgency. Negative for difficulty urinating.  Musculoskeletal: Negative for arthralgias and myalgias.   Skin: Negative for rash.  Neurological: Negative for headaches.  Psychiatric/Behavioral: Negative for sleep disturbance.    Objective:  BP 128/76 (BP Location: Right Arm)   Pulse 65   Temp 98 F (36.7 C) (Oral)   Ht 6' (1.829 m)   Wt 254 lb (115.2 kg)   BMI 34.45 kg/m   BP Readings from Last 3 Encounters:  07/06/18 128/76  03/23/18 (!) 175/82  01/28/18 (!) 142/76    Wt Readings from Last 3 Encounters:  07/06/18 254 lb (115.2 kg)  03/23/18 261 lb (118.4 kg)  01/28/18 261 lb (118.4 kg)     Physical Exam Vitals signs reviewed.  Constitutional:      Appearance: Tyler Burnett is well-developed.  HENT:     Head: Normocephalic and atraumatic.     Right Ear: Tympanic membrane and external ear normal. No decreased hearing noted.     Left Ear: Tympanic membrane and external ear normal. No decreased hearing noted.     Mouth/Throat:     Pharynx: No oropharyngeal exudate or posterior oropharyngeal erythema.  Eyes:     Pupils: Pupils are equal, round, and reactive to light.  Neck:     Musculoskeletal: Normal range of motion and neck supple.  Cardiovascular:     Rate and Rhythm: Normal rate and regular rhythm.     Heart sounds: No murmur.  Pulmonary:     Effort: No respiratory distress.     Breath sounds: Normal breath sounds.  Abdominal:     General: Bowel sounds are normal.     Palpations: Abdomen is soft. There is no mass.     Tenderness:  There is no abdominal tenderness.       Assessment & Plan:   Tyler Burnett was seen today for diabetes.  Diagnoses and all orders for this visit:  Type 2 diabetes mellitus without complication, without long-term current use of insulin (HCC) -     CBC with Differential/Platelet -     Bayer DCA Hb A1c Waived  Mixed hyperlipidemia -     CBC with Differential/Platelet -     Lipid panel  Essential hypertension, benign -     CBC with Differential/Platelet -     CMP14+EGFR  Prostatitis, unspecified prostatitis type -     Urinalysis -      Urine Culture  Swelling -     furosemide (LASIX) 20 MG tablet; TAKE 1 TABLET(20 MG) BY MOUTH DAILY  Other orders -     ciprofloxacin (CIPRO) 500 MG tablet; Take 1 tablet (500 mg total) by mouth 2 (two) times daily. For prostate. Take all of these. -     amLODipine (NORVASC) 10 MG tablet; TAKE 1 TABLET BY MOUTH DAILY FOR BLOOD PRESSURE -     atorvastatin (LIPITOR) 40 MG tablet; TAKE 1 TABLET(40 MG) BY MOUTH DAILY -     metFORMIN (GLUCOPHAGE) 1000 MG tablet; Take 1 tablet (1,000 mg total) by mouth 2 (two) times daily with a meal.      I have discontinued Tyler Burnett's baclofen. I have also changed his metFORMIN. Additionally, I am having him start on ciprofloxacin. Lastly, I am having him maintain his blood glucose meter kit and supplies, amLODipine, atorvastatin, and furosemide.  Meds ordered this encounter  Medications  . ciprofloxacin (CIPRO) 500 MG tablet    Sig: Take 1 tablet (500 mg total) by mouth 2 (two) times daily. For prostate. Take all of these.    Dispense:  30 tablet    Refill:  0  . amLODipine (NORVASC) 10 MG tablet    Sig: TAKE 1 TABLET BY MOUTH DAILY FOR BLOOD PRESSURE    Dispense:  90 tablet    Refill:  0  . atorvastatin (LIPITOR) 40 MG tablet    Sig: TAKE 1 TABLET(40 MG) BY MOUTH DAILY    Dispense:  90 tablet    Refill:  0  . furosemide (LASIX) 20 MG tablet    Sig: TAKE 1 TABLET(20 MG) BY MOUTH DAILY    Dispense:  90 tablet    Refill:  0  . metFORMIN (GLUCOPHAGE) 1000 MG tablet    Sig: Take 1 tablet (1,000 mg total) by mouth 2 (two) times daily with a meal.    Dispense:  180 tablet    Refill:  0     Follow-up: Return in about 3 months (around 10/06/2018).  Claretta Fraise, M.D.

## 2018-07-07 LAB — CMP14+EGFR
ALT: 14 IU/L (ref 0–44)
AST: 11 IU/L (ref 0–40)
Albumin/Globulin Ratio: 2.1 (ref 1.2–2.2)
Albumin: 4.6 g/dL (ref 3.8–4.8)
Alkaline Phosphatase: 72 IU/L (ref 39–117)
BUN/Creatinine Ratio: 7 — ABNORMAL LOW (ref 10–24)
BUN: 8 mg/dL (ref 8–27)
Bilirubin Total: 0.6 mg/dL (ref 0.0–1.2)
CO2: 24 mmol/L (ref 20–29)
Calcium: 9.6 mg/dL (ref 8.6–10.2)
Chloride: 101 mmol/L (ref 96–106)
Creatinine, Ser: 1.1 mg/dL (ref 0.76–1.27)
GFR calc Af Amer: 83 mL/min/{1.73_m2} (ref >59)
GFR calc non Af Amer: 72 mL/min/{1.73_m2} (ref >59)
Globulin, Total: 2.2 g/dL (ref 1.5–4.5)
Glucose: 103 mg/dL — ABNORMAL HIGH (ref 65–99)
Potassium: 4.3 mmol/L (ref 3.5–5.2)
Sodium: 140 mmol/L (ref 134–144)
Total Protein: 6.8 g/dL (ref 6.0–8.5)

## 2018-07-07 LAB — CBC WITH DIFFERENTIAL/PLATELET
Basophils Absolute: 0 10*3/uL (ref 0.0–0.2)
Basos: 0 %
EOS (ABSOLUTE): 0.2 10*3/uL (ref 0.0–0.4)
Eos: 4 %
Hematocrit: 45 % (ref 37.5–51.0)
Hemoglobin: 15.6 g/dL (ref 13.0–17.7)
Immature Grans (Abs): 0 10*3/uL (ref 0.0–0.1)
Immature Granulocytes: 0 %
Lymphocytes Absolute: 2.9 10*3/uL (ref 0.7–3.1)
Lymphs: 49 %
MCH: 28.9 pg (ref 26.6–33.0)
MCHC: 34.7 g/dL (ref 31.5–35.7)
MCV: 83 fL (ref 79–97)
Monocytes Absolute: 0.5 10*3/uL (ref 0.1–0.9)
Monocytes: 8 %
Neutrophils Absolute: 2.3 10*3/uL (ref 1.4–7.0)
Neutrophils: 39 %
Platelets: 242 10*3/uL (ref 150–450)
RBC: 5.4 x10E6/uL (ref 4.14–5.80)
RDW: 13.2 % (ref 11.6–15.4)
WBC: 6 10*3/uL (ref 3.4–10.8)

## 2018-07-07 LAB — LIPID PANEL
Chol/HDL Ratio: 2.7 ratio (ref 0.0–5.0)
Cholesterol, Total: 126 mg/dL (ref 100–199)
HDL: 47 mg/dL (ref >39)
LDL Calculated: 57 mg/dL (ref 0–99)
Triglycerides: 112 mg/dL (ref 0–149)
VLDL Cholesterol Cal: 22 mg/dL (ref 5–40)

## 2018-07-09 LAB — URINE CULTURE

## 2018-07-26 ENCOUNTER — Telehealth: Payer: Self-pay | Admitting: Family Medicine

## 2018-07-26 ENCOUNTER — Other Ambulatory Visit: Payer: Self-pay | Admitting: Family Medicine

## 2018-07-26 MED ORDER — CIPROFLOXACIN HCL 500 MG PO TABS: 500.0000 mg | ORAL_TABLET | Freq: Two times a day (BID) | ORAL | 0 refills | Status: DC

## 2018-07-26 NOTE — Telephone Encounter (Signed)
Left message for patient to call.

## 2018-07-26 NOTE — Telephone Encounter (Signed)
Please contact the patient : It can take several weeks of medication to clear prostate infections. This is because it is very spongy. I am sending in a refill of the antibiotic, cipro Thanks, WS

## 2018-07-26 NOTE — Telephone Encounter (Signed)
Aware. 

## 2018-08-16 ENCOUNTER — Other Ambulatory Visit: Payer: Self-pay

## 2018-08-16 ENCOUNTER — Encounter: Payer: Self-pay | Admitting: Family Medicine

## 2018-08-16 ENCOUNTER — Ambulatory Visit: Payer: BC Managed Care – PPO | Admitting: Family Medicine

## 2018-08-16 VITALS — BP 180/83 | HR 55 | Temp 97.2°F | Ht 72.0 in | Wt 253.0 lb

## 2018-08-16 DIAGNOSIS — N411 Chronic prostatitis: Secondary | ICD-10-CM | POA: Diagnosis not present

## 2018-08-16 DIAGNOSIS — M792 Neuralgia and neuritis, unspecified: Secondary | ICD-10-CM

## 2018-08-16 MED ORDER — TRAMADOL HCL 50 MG PO TABS: 50.0000 mg | ORAL_TABLET | Freq: Four times a day (QID) | ORAL | 0 refills | Status: AC

## 2018-08-16 MED ORDER — BETAMETHASONE SOD PHOS & ACET 6 (3-3) MG/ML IJ SUSP
6.0000 mg | Freq: Once | INTRAMUSCULAR | Status: AC
  Administered 2018-08-16: 6 mg via INTRAMUSCULAR

## 2018-08-16 NOTE — Progress Notes (Signed)
Chief Complaint  Patient presents with  . Shoulder Pain  . right arm feels tingly and numb    HPI  Patient presents today for Awoke 3 days ago with moderate pain at right shoulder. Did some lifting the day before at work. Nothing heavy. He was mostly supervising. Now pain radiating to the right arm. Finger tips are numb. Pain intensifies at the level of the antecubital fossa.   Still having dysuria and groin pain. Feels better if he avoids soda and just drinks water.   PMH: Smoking status noted ROS: Per HPI  Objective: BP (!) 180/83   Pulse (!) 55   Temp (!) 97.2 F (36.2 C) (Oral)   Ht 6' (1.829 m)   Wt 253 lb (114.8 kg)   BMI 34.31 kg/m  Gen: NAD, alert, cooperative with exam HEENT: NCAT, EOMI, PERRL Ext: No edema, warm. Tender edema at upper margin of scapula at right. FROM passively RUE. Sensation grossly intact. FROM and 5/5 strength RUE. Neuro: Alert and oriented, No gross deficits  Assessment and plan:  1. Neuralgia   2. Chronic prostatitis     Meds ordered this encounter  Medications  . traMADol (ULTRAM) 50 MG tablet    Sig: Take 1 tablet (50 mg total) by mouth 4 (four) times daily for 5 days. 1-2 tablets up to 4 times a day as needed for pain    Dispense:  20 tablet    Refill:  0    Orders Placed This Encounter  Procedures  . Ambulatory referral to Urology    Referral Priority:   Routine    Referral Type:   Consultation    Referral Reason:   Specialty Services Required    Requested Specialty:   Urology    Number of Visits Requested:   1    Follow up as needed.  Claretta Fraise, MD

## 2018-08-16 NOTE — Addendum Note (Signed)
Addended by: Rolena Infante on: 08/16/2018 04:40 PM   Modules accepted: Orders

## 2018-08-17 ENCOUNTER — Telehealth: Payer: Self-pay | Admitting: *Deleted

## 2018-08-17 ENCOUNTER — Telehealth: Payer: Self-pay | Admitting: Family Medicine

## 2018-08-17 DIAGNOSIS — M25511 Pain in right shoulder: Secondary | ICD-10-CM

## 2018-08-17 DIAGNOSIS — M792 Neuralgia and neuritis, unspecified: Secondary | ICD-10-CM

## 2018-08-17 NOTE — Telephone Encounter (Signed)
Spoke with pt and he took Tramadol yesterday at 8pm and then woke up this morning at 3am with "excruciating" shooting pain at the right armpit area and he wasn't sure if it is a reaction to the medication or how he was sleeping.Pt denies SOB or heart racing. The pain is still there somewhat but was relieved with heat for a little bit. Advised pt I didn't think the pain is a reaction from the Tramadol but possibly positional and to hold off on the Tramadol till I verified with you. Please advise?

## 2018-08-17 NOTE — Telephone Encounter (Signed)
Got nurse

## 2018-08-17 NOTE — Telephone Encounter (Signed)
Pt aware of MD feedback and voiced understanding and did want referral to ortho so referral placed.

## 2018-08-17 NOTE — Telephone Encounter (Signed)
The tramamdol wouldn't do that. It just wasn't strong enough to relieve it. He can double up to two at a time for that. Also. WOuld he like a referral to ortho?

## 2018-08-19 ENCOUNTER — Telehealth: Payer: Self-pay | Admitting: Family Medicine

## 2018-08-19 NOTE — Telephone Encounter (Signed)
Patient aware that note is ready.

## 2018-08-19 NOTE — Telephone Encounter (Signed)
Okay to be off work. Please write as requested

## 2018-09-27 ENCOUNTER — Telehealth: Payer: Self-pay | Admitting: Family Medicine

## 2018-09-27 NOTE — Telephone Encounter (Signed)
Patient aware that he is supposed to be on amlodipine and furosemide.

## 2018-11-14 ENCOUNTER — Ambulatory Visit (INDEPENDENT_AMBULATORY_CARE_PROVIDER_SITE_OTHER): Payer: BC Managed Care – PPO | Admitting: Family Medicine

## 2018-11-14 ENCOUNTER — Encounter: Payer: Self-pay | Admitting: Family Medicine

## 2018-11-14 DIAGNOSIS — J069 Acute upper respiratory infection, unspecified: Secondary | ICD-10-CM

## 2018-11-14 DIAGNOSIS — R6889 Other general symptoms and signs: Secondary | ICD-10-CM

## 2018-11-14 DIAGNOSIS — Z20822 Contact with and (suspected) exposure to covid-19: Secondary | ICD-10-CM

## 2018-11-14 MED ORDER — BENZONATATE 100 MG PO CAPS: 100.0000 mg | ORAL_CAPSULE | Freq: Three times a day (TID) | ORAL | 0 refills | Status: DC | PRN

## 2018-11-14 NOTE — Progress Notes (Signed)
Virtual Visit via telephone Note Due to COVID-19 pandemic this visit was conducted virtually. This visit type was conducted due to national recommendations for restrictions regarding the COVID-19 Pandemic (e.g. social distancing, sheltering in place) in an effort to limit this patient's exposure and mitigate transmission in our community. All issues noted in this document were discussed and addressed.  A physical exam was not performed with this format.   I connected with Tyler Burnett on 11/14/18 at 1145 by telephone and verified that I am speaking with the correct person using two identifiers. Tyler Burnett is currently located at home and family is currently with them during visit. The provider, Monia Pouch, FNP is located in their office at time of visit.  I discussed the limitations, risks, security and privacy concerns of performing an evaluation and management service by telephone and the availability of in person appointments. I also discussed with the patient that there may be a patient responsible charge related to this service. The patient expressed understanding and agreed to proceed.  Subjective:  Patient ID: Tyler Burnett, male    DOB: March 09, 1955, 63 y.o.   MRN: [id number]  Chief Complaint:  URI   HPI: Tyler Burnett is a 63 y.o. male presenting on 11/14/2018 for URI   Pt reports three days of cough, congestion, and rhinorrhea. Pt states he has been around three coworkers who have had similar symptoms. He denies fever, chills, abdominal pain, diarrhea, shortness of breath, fatigue, or chest pain.   URI  This is a new problem. The current episode started in the past 7 days. The problem has been unchanged. There has been no fever. Associated symptoms include congestion, coughing and rhinorrhea. Pertinent negatives include no abdominal pain, chest pain, diarrhea, dysuria, ear pain, headaches, joint pain, joint swelling, nausea, neck pain, plugged ear sensation, rash, sinus pain,  sneezing, sore throat, swollen glands, vomiting or wheezing. Treatments tried: NyQuil. The treatment provided significant relief.     Relevant past medical, surgical, family, and social history reviewed and updated as indicated.  Allergies and medications reviewed and updated.   Past Medical History:  Diagnosis Date  . Diabetes mellitus without complication ([place])   . Hypertension     Past Surgical History:  Procedure Laterality Date  . arm surgery  1980   had broke in 9 places   . FRACTURE SURGERY      Social History   Socioeconomic History  . Marital status: Married    Spouse name: Not on file  . Number of children: Not on file  . Years of education: Not on file  . Highest education level: Not on file  Occupational History  . Not on file  Social Needs  . Financial resource strain: Not on file  . Food insecurity    Worry: Not on file    Inability: Not on file  . Transportation needs    Medical: Not on file    Non-medical: Not on file  Tobacco Use  . Smoking status: Never Smoker  . Smokeless tobacco: Never Used  Substance and Sexual Activity  . Alcohol use: No    Comment: occasion 2 drinks per year  . Drug use: No  . Sexual activity: Yes    Partners: Female  Lifestyle  . Physical activity    Days per week: Not on file    Minutes per session: Not on file  . Stress: Not on file  Relationships  . Social connections    Talks on phone: Not on  file    Gets together: Not on file    Attends religious service: Not on file    Active member of club or organization: Not on file    Attends meetings of clubs or organizations: Not on file    Relationship status: Not on file  . Intimate partner violence    Fear of current or ex partner: Not on file    Emotionally abused: Not on file    Physically abused: Not on file    Forced sexual activity: Not on file  Other Topics Concern  . Not on file  Social History Narrative  . Not on file    Outpatient Encounter  Medications as of 11/14/2018  Medication Sig  . amLODipine (NORVASC) 10 MG tablet TAKE 1 TABLET BY MOUTH DAILY FOR BLOOD PRESSURE  . atorvastatin (LIPITOR) 40 MG tablet TAKE 1 TABLET(40 MG) BY MOUTH DAILY  . benzonatate (TESSALON PERLES) 100 MG capsule Take 1 capsule (100 mg total) by mouth 3 (three) times daily as needed for cough.  . blood glucose meter kit and supplies KIT Dispense based on patient and insurance preference. Use up to four times daily as directed. (FOR ICD-9 250.00, 250.01).  . furosemide (LASIX) 20 MG tablet TAKE 1 TABLET(20 MG) BY MOUTH DAILY  . metFORMIN (GLUCOPHAGE) 1000 MG tablet Take 1 tablet (1,000 mg total) by mouth 2 (two) times daily with a meal.  . [DISCONTINUED] ciprofloxacin (CIPRO) 500 MG tablet Take 1 tablet (500 mg total) by mouth 2 (two) times daily. For prostate. Take all of these.   No facility-administered encounter medications on file as of 11/14/2018.     Allergies  Allergen Reactions  . Doxycycline Other (See Comments)    Abdominal pain    Review of Systems  Constitutional: Negative for activity change, appetite change, chills, diaphoresis, fatigue, fever and unexpected weight change.  HENT: Positive for congestion and rhinorrhea. Negative for ear pain, sinus pressure, sinus pain, sneezing, sore throat, tinnitus, trouble swallowing and voice change.   Eyes: Negative.   Respiratory: Positive for cough. Negative for chest tightness, shortness of breath and wheezing.   Cardiovascular: Negative for chest pain, palpitations and leg swelling.  Gastrointestinal: Negative for abdominal pain, blood in stool, constipation, diarrhea, nausea and vomiting.  Endocrine: Negative.   Genitourinary: Negative for decreased urine volume, difficulty urinating, dysuria, frequency and urgency.  Musculoskeletal: Negative for arthralgias, joint pain, myalgias and neck pain.  Skin: Negative.  Negative for color change, pallor and rash.  Allergic/Immunologic: Negative.    Neurological: Negative for dizziness, weakness, light-headedness and headaches.  Hematological: Negative.   Psychiatric/Behavioral: Negative for confusion, hallucinations, sleep disturbance and suicidal ideas.  All other systems reviewed and are negative.        Observations/Objective: No vital signs or physical exam, this was a telephone or virtual health encounter.  Pt alert and oriented, answers all questions appropriately, and able to speak in full sentences.    Assessment and Plan: Tyler Burnett was seen today for URI.  Diagnoses and all orders for this visit:  URI with cough and congestion Reported symptoms consistent with URI with cough and congestion, possible COVID-19 infection. No red flags or indications of acute bacterial infection. Due to hypertension, pt was advised to avoid OTC cold medications with stimulants. Pt aware to take Coricidin HBP for symptom control. Will give Tessalon for cough. Increase water intake. Can use tylenol as needed for fever and pain control. Report any new or worsening symptoms.  -  benzonatate (TESSALON PERLES) 100 MG capsule; Take 1 capsule (100 mg total) by mouth 3 (three) times daily as needed for cough.  Suspected Covid-19 Virus Infection Pt aware of testing centers and hours of operation. Pt aware of infection prevention measures and self quarantine measures. Pt aware to shelter in place until symptom free without medications for 3 days. Report any new or worsening symptoms.     Follow Up Instructions: Return if symptoms worsen or fail to improve.    I discussed the assessment and treatment plan with the patient. The patient was provided an opportunity to ask questions and all were answered. The patient agreed with the plan and demonstrated an understanding of the instructions.   The patient was advised to call back or seek an in-person evaluation if the symptoms worsen or if the condition fails to improve as anticipated.  The above  assessment and management plan was discussed with the patient. The patient verbalized understanding of and has agreed to the management plan. Patient is aware to call the clinic if they develop any new symptoms or if symptoms persist or worsen. Patient is aware when to return to the clinic for a follow-up visit. Patient educated on when it is appropriate to go to the emergency department.    I provided 15 minutes of non-face-to-face time during this encounter. The call started at 1145. The call ended at 1200. The other time was used for coordination of care.    Monia Pouch, FNP-C [facility] [address] [place], [place] [address] [phone] 11/14/18

## 2018-11-17 ENCOUNTER — Encounter (INDEPENDENT_AMBULATORY_CARE_PROVIDER_SITE_OTHER): Payer: Self-pay

## 2018-11-24 ENCOUNTER — Telehealth: Payer: Self-pay | Admitting: Family Medicine

## 2018-11-28 ENCOUNTER — Other Ambulatory Visit: Payer: Self-pay | Admitting: Family Medicine

## 2018-11-28 MED ORDER — BENZONATATE 200 MG PO CAPS: 200.0000 mg | ORAL_CAPSULE | Freq: Three times a day (TID) | ORAL | 0 refills | Status: DC | PRN

## 2018-11-28 MED ORDER — AZITHROMYCIN 250 MG PO TABS: ORAL_TABLET | ORAL | 0 refills | Status: DC

## 2018-11-28 NOTE — Telephone Encounter (Signed)
I sent in a zpack for him  

## 2018-11-28 NOTE — Telephone Encounter (Signed)
Patient aware.

## 2019-04-18 ENCOUNTER — Other Ambulatory Visit: Payer: Self-pay

## 2019-04-19 ENCOUNTER — Ambulatory Visit: Payer: BC Managed Care – PPO | Admitting: Family Medicine

## 2019-04-19 ENCOUNTER — Encounter: Payer: Self-pay | Admitting: Family Medicine

## 2019-04-19 VITALS — BP 132/73 | HR 77 | Temp 97.8°F | Ht 72.0 in | Wt 245.8 lb

## 2019-04-19 DIAGNOSIS — R102 Pelvic and perineal pain: Secondary | ICD-10-CM

## 2019-04-19 DIAGNOSIS — R339 Retention of urine, unspecified: Secondary | ICD-10-CM | POA: Diagnosis not present

## 2019-04-19 LAB — MICROSCOPIC EXAMINATION
Bacteria, UA: NONE SEEN
Epithelial Cells (non renal): NONE SEEN /hpf (ref 0–10)
RBC, Urine: NONE SEEN /hpf (ref 0–2)
Renal Epithel, UA: NONE SEEN /hpf
WBC, UA: NONE SEEN /hpf (ref 0–5)

## 2019-04-19 LAB — URINALYSIS, COMPLETE
Bilirubin, UA: NEGATIVE
Glucose, UA: NEGATIVE
Ketones, UA: NEGATIVE
Leukocytes,UA: NEGATIVE
Nitrite, UA: NEGATIVE
Protein,UA: NEGATIVE
RBC, UA: NEGATIVE
Specific Gravity, UA: >1.03 — ABNORMAL HIGH (ref 1.005–1.030)
Urobilinogen, Ur: 2 mg/dL — ABNORMAL HIGH (ref 0.2–1.0)
pH, UA: 5 (ref 5.0–7.5)

## 2019-04-19 NOTE — Patient Instructions (Signed)
Acute Urinary Retention, Male ° °Acute urinary retention means that you cannot pee (urinate) at all, or that you pee too little and your bladder is not emptied completely. If it is not treated, it can lead to kidney damage or other serious problems. °Follow these instructions at home: °· Take over-the-counter and prescription medicines only as told by your doctor. Ask your doctor what medicines you should stay away from. Do not take any medicine unless your doctor says it is okay to do so. °· If you were sent home with a tube that drains the bladder (catheter), take care of it as told by your doctor. °· Drink enough fluid to keep your pee clear or pale yellow. °· If you were given an antibiotic, take it as told by your doctor. Do not stop taking the antibiotic even if you start to feel better. °· Do not use any products that contain nicotine or tobacco, such as cigarettes and e-cigarettes. If you need help quitting, ask your doctor. °· Watch for changes in your symptoms. Tell your doctor about them. °· If told, track changes in your blood pressure at home. Tell your doctor about them. °· Keep all follow-up visits as told by your doctor. This is important. °Contact a doctor if: °· You have spasms or you leak pee when you have spasms. °Get help right away if: °· You have chills or a fever. °· You have a tube that drains the bladder and: °? The tube stops draining pee. °? The tube falls out. °· You have blood in your pee. °Summary °· Acute urinary retention means that you have problems peeing. It may mean that you cannot pee at all, or that you pee too little. °· If this condition is not treated, it can lead to kidney damage or other serious problems. °· If you were sent home with a tube that drains the bladder, take care of it as told by your doctor. °· Monitor any changes in your symptoms. Tell your doctor about any changes. °This information is not intended to replace advice given to you by your health care  provider. Make sure you discuss any questions you have with your health care provider. °Document Revised: 05/05/2018 Document Reviewed: 03/20/2016 °Elsevier Patient Education © 2020 [employer] ° °

## 2019-04-19 NOTE — Progress Notes (Signed)
Assessment & Plan:  1-2. Acute suprapubic pain/Urinary retention - Urinalysis, Complete - Urine Culture - Ambulatory referral to Urology   Follow up plan: Return if symptoms worsen or fail to improve.  Hendricks Limes, MSN, APRN, FNP-C [facility]  Subjective:   Patient ID: Tyler Burnett, male    DOB: 08-23-1955, 64 y.o.   MRN: [id number]  HPI: Tyler Burnett is a 64 y.o. male presenting on 04/19/2019 for Pelvic Pain  Patient reports suprapubic abdominal pain that started a week ago on Monday.  The only thing he reports he did different at that time was drink a glass of sweet tea, when he normally drinks only water.  Patient reports he has been taking Azo which she feels is helpful.  He does experience urinary urgency and frequency but states these are not new symptoms for him, as they have been going on for many many months.  He denies trouble initiating a urine stream, weak stream, split stream, and dribbling.  He does report he gets up multiple times a night to urinate.  On a good night he only gets up 3 times.  On a normal night he gets up between 4 and 6 times.  And on a bad night he gets up as many as 10 times.  Patient also denies fever.  He feels like he is completely emptying his bladder.  He does report occasional constipation but has not been constipated since the onset of the symptoms.  Most recent PSA 0.6 on 08/17/2016.   ROS: Negative unless specifically indicated above in HPI.   Relevant past medical history reviewed and updated as indicated.   Allergies and medications reviewed and updated.   Current Outpatient Medications:  .  amLODipine (NORVASC) 10 MG tablet, TAKE 1 TABLET BY MOUTH DAILY FOR BLOOD PRESSURE, Disp: 90 tablet, Rfl: 0 .  atorvastatin (LIPITOR) 40 MG tablet, TAKE 1 TABLET(40 MG) BY MOUTH DAILY, Disp: 90 tablet, Rfl: 0 .  blood glucose meter kit and supplies KIT, Dispense based on patient and insurance preference. Use up to four times  daily as directed. (FOR ICD-9 250.00, 250.01)., Disp: 1 each, Rfl: 0 .  furosemide (LASIX) 20 MG tablet, TAKE 1 TABLET(20 MG) BY MOUTH DAILY, Disp: 90 tablet, Rfl: 0 .  metFORMIN (GLUCOPHAGE) 1000 MG tablet, Take 1 tablet (1,000 mg total) by mouth 2 (two) times daily with a meal., Disp: 180 tablet, Rfl: 0  Allergies  Allergen Reactions  . Doxycycline Other (See Comments)    Abdominal pain    Objective:   BP 132/73   Pulse 77   Temp 97.8 F (36.6 C) (Temporal)   Ht 6' (1.829 m)   Wt 245 lb 12.8 oz (111.5 kg)   SpO2 96%   BMI 33.34 kg/m    Physical Exam Vitals reviewed.  Constitutional:      General: He is not in acute distress.    Appearance: Normal appearance. He is obese. He is not ill-appearing, toxic-appearing or diaphoretic.  HENT:     Head: Normocephalic and atraumatic.  Eyes:     General: No scleral icterus.       Right eye: No discharge.        Left eye: No discharge.     Conjunctiva/sclera: Conjunctivae normal.  Cardiovascular:     Rate and Rhythm: Normal rate.  Pulmonary:     Effort: Pulmonary effort is normal. No respiratory distress.  Abdominal:     General: Bowel sounds are normal. There is no  distension.     Palpations: Abdomen is soft. There is no mass.     Tenderness: There is abdominal tenderness in the suprapubic area. There is no right CVA tenderness, left CVA tenderness, guarding or rebound.     Hernia: No hernia is present.  Musculoskeletal:        General: Normal range of motion.     Cervical back: Normal range of motion.  Skin:    General: Skin is warm and dry.  Neurological:     Mental Status: He is alert and oriented to person, place, and time. Mental status is at baseline.  Psychiatric:        Mood and Affect: Mood normal.        Behavior: Behavior normal.        Thought Content: Thought content normal.        Judgment: Judgment normal.

## 2019-04-21 ENCOUNTER — Ambulatory Visit: Payer: BC Managed Care – PPO | Admitting: Family Medicine

## 2019-05-04 ENCOUNTER — Encounter: Payer: Self-pay | Admitting: Family Medicine

## 2019-05-31 ENCOUNTER — Telehealth: Payer: Self-pay | Admitting: Family Medicine

## 2019-05-31 NOTE — Telephone Encounter (Signed)
Normal to be red and pruritic. If he develops a bullseye rash, he should be seen. Can use cortisone or benadryl cream for symptoms.

## 2019-05-31 NOTE — Telephone Encounter (Signed)
Patient aware and verbalized understanding. °

## 2019-07-17 ENCOUNTER — Other Ambulatory Visit: Payer: Self-pay | Admitting: Family Medicine

## 2019-07-17 DIAGNOSIS — R609 Edema, unspecified: Secondary | ICD-10-CM

## 2019-07-17 MED ORDER — FUROSEMIDE 20 MG PO TABS: ORAL_TABLET | ORAL | 0 refills | Status: DC

## 2019-07-17 MED ORDER — AMLODIPINE BESYLATE 10 MG PO TABS: ORAL_TABLET | ORAL | 0 refills | Status: DC

## 2019-07-17 NOTE — Addendum Note (Signed)
Addended by: Hessie Diener on: 07/17/2019 11:09 AM   Modules accepted: Orders

## 2019-07-17 NOTE — Telephone Encounter (Signed)
[facility] NTBS LOV 07/06/18

## 2019-07-17 NOTE — Telephone Encounter (Signed)
Pt scheduled with Dr Darlyn Read for follow up on 08/10/19 at 7:55. 30 day supply sent to pharmacy.

## 2019-08-10 ENCOUNTER — Encounter: Payer: Self-pay | Admitting: Family Medicine

## 2019-08-10 ENCOUNTER — Ambulatory Visit: Payer: BC Managed Care – PPO | Admitting: Family Medicine

## 2019-08-10 ENCOUNTER — Other Ambulatory Visit: Payer: Self-pay

## 2019-08-10 VITALS — BP 148/88 | HR 60 | Temp 97.5°F | Resp 20 | Ht 72.0 in | Wt 247.2 lb

## 2019-08-10 DIAGNOSIS — E119 Type 2 diabetes mellitus without complications: Secondary | ICD-10-CM | POA: Diagnosis not present

## 2019-08-10 DIAGNOSIS — E782 Mixed hyperlipidemia: Secondary | ICD-10-CM | POA: Diagnosis not present

## 2019-08-10 DIAGNOSIS — I10 Essential (primary) hypertension: Secondary | ICD-10-CM

## 2019-08-10 LAB — BAYER DCA HB A1C WAIVED: HB A1C (BAYER DCA - WAIVED): 7.4 % — ABNORMAL HIGH (ref <7.0)

## 2019-08-10 MED ORDER — RYBELSUS 3 MG PO TABS
3.0000 mg | ORAL_TABLET | Freq: Every morning | ORAL | 0 refills | Status: AC
Start: 2019-08-10 — End: 2019-09-09

## 2019-08-10 MED ORDER — TAMSULOSIN HCL 0.4 MG PO CAPS: 0.4000 mg | ORAL_CAPSULE | Freq: Every day | ORAL | 2 refills | Status: DC

## 2019-08-10 MED ORDER — ATORVASTATIN CALCIUM 40 MG PO TABS: ORAL_TABLET | ORAL | 0 refills | Status: DC

## 2019-08-10 MED ORDER — AMLODIPINE BESYLATE 10 MG PO TABS: ORAL_TABLET | ORAL | 1 refills | Status: DC

## 2019-08-10 MED ORDER — LISINOPRIL 10 MG PO TABS
10.0000 mg | ORAL_TABLET | Freq: Every day | ORAL | 3 refills | Status: DC
Start: 2019-08-10 — End: 2021-05-26

## 2019-08-10 MED ORDER — METFORMIN HCL 1000 MG PO TABS: 1000.0000 mg | ORAL_TABLET | Freq: Two times a day (BID) | ORAL | 0 refills | Status: DC

## 2019-08-10 NOTE — Progress Notes (Signed)
Subjective:  Patient ID: Tyler Burnett, male    DOB: 1955-09-20  Age: 64 y.o. MRN: [id number]  CC: Medical Management of Chronic Issues   HPI Tyler Burnett presents forFollow-up of diabetes.  He is not checking his blood sugar lately.  He is working on weight loss however.  Has a net loss in excess of 20 pounds.  He would like the new medicine that helps with diabetes and weight loss.  Patient denies symptoms such as polyuria, polydipsia, excessive hunger, nausea No significant hypoglycemic spells noted. Medications reviewed. Pt reports taking them regularly without complication/adverse reaction being reported today.     History Tyler Burnett has a past medical history of Diabetes mellitus without complication ([place]) and Hypertension.   He has a past surgical history that includes Fracture surgery and arm surgery (1980).   His family history includes Aneurysm in his mother; Heart disease in his father.He reports that he has never smoked. He has never used smokeless tobacco. He reports that he does not drink alcohol and does not use drugs.  No current outpatient medications on file prior to visit.   No current facility-administered medications on file prior to visit.    ROS Review of Systems  Constitutional: Negative.   HENT: Negative.   Eyes: Negative for visual disturbance.  Respiratory: Negative for cough and shortness of breath.   Cardiovascular: Negative for chest pain and leg swelling.  Gastrointestinal: Negative for abdominal pain, diarrhea, nausea and vomiting.  Genitourinary: Negative for difficulty urinating.  Musculoskeletal: Negative for arthralgias and myalgias.  Skin: Negative for rash.  Neurological: Negative for headaches.  Psychiatric/Behavioral: Negative for sleep disturbance.    Objective:  BP (!) 148/88   Pulse 60   Temp (!) 97.5 F (36.4 C) (Temporal)   Resp 20   Ht 6' (1.829 m)   Wt 247 lb 4 oz (112.2 kg)   SpO2 97%   BMI 33.53 kg/m   BP Readings  from Last 3 Encounters:  08/10/19 (!) 148/88  04/19/19 132/73  08/16/18 (!) 180/83    Wt Readings from Last 3 Encounters:  08/10/19 247 lb 4 oz (112.2 kg)  04/19/19 245 lb 12.8 oz (111.5 kg)  08/16/18 253 lb (114.8 kg)     Physical Exam Vitals reviewed.  Constitutional:      Appearance: He is well-developed.  HENT:     Head: Normocephalic and atraumatic.     Right Ear: Tympanic membrane and external ear normal. No decreased hearing noted.     Left Ear: Tympanic membrane and external ear normal. No decreased hearing noted.     Mouth/Throat:     Pharynx: No oropharyngeal exudate or posterior oropharyngeal erythema.  Eyes:     Pupils: Pupils are equal, round, and reactive to light.  Cardiovascular:     Rate and Rhythm: Normal rate and regular rhythm.     Heart sounds: No murmur heard.   Pulmonary:     Effort: No respiratory distress.     Breath sounds: Normal breath sounds.  Abdominal:     General: Bowel sounds are normal.     Palpations: Abdomen is soft. There is no mass.     Tenderness: There is no abdominal tenderness.  Musculoskeletal:     Cervical back: Normal range of motion and neck supple.       Assessment & Plan:   Tyler Burnett was seen today for medical management of chronic issues.  Diagnoses and all orders for this visit:  Type 2 diabetes mellitus without complication,  without long-term current use of insulin (HCC) -     Microalbumin / creatinine urine ratio -     Bayer DCA Hb A1c Waived -     CBC with Differential/Platelet -     CMP14+EGFR -     Lipid panel  Mixed hyperlipidemia -     CBC with Differential/Platelet -     CMP14+EGFR -     Lipid panel  Essential hypertension, benign -     CBC with Differential/Platelet -     CMP14+EGFR -     Lipid panel  Other orders -     lisinopril (ZESTRIL) 10 MG tablet; Take 1 tablet (10 mg total) by mouth daily. -     Semaglutide (RYBELSUS) 3 MG TABS; Take 3 mg by mouth in the morning. -     amLODipine  (NORVASC) 10 MG tablet; TAKE 1 TABLET BY MOUTH DAILY FOR BLOOD PRESSURE -     metFORMIN (GLUCOPHAGE) 1000 MG tablet; Take 1 tablet (1,000 mg total) by mouth 2 (two) times daily with a meal. -     atorvastatin (LIPITOR) 40 MG tablet; TAKE 1 TABLET(40 MG) BY MOUTH DAILY -     tamsulosin (FLOMAX) 0.4 MG CAPS capsule; Take 1 capsule (0.4 mg total) by mouth daily.      I have discontinued [name]'s blood glucose meter kit and supplies and furosemide. I have also changed his tamsulosin. Additionally, I am having him start on lisinopril and Rybelsus. Lastly, I am having him maintain his amLODipine, metFORMIN, and atorvastatin.  Meds ordered this encounter  Medications  . lisinopril (ZESTRIL) 10 MG tablet    Sig: Take 1 tablet (10 mg total) by mouth daily.    Dispense:  90 tablet    Refill:  3  . Semaglutide (RYBELSUS) 3 MG TABS    Sig: Take 3 mg by mouth in the morning.    Dispense:  30 tablet    Refill:  0  . amLODipine (NORVASC) 10 MG tablet    Sig: TAKE 1 TABLET BY MOUTH DAILY FOR BLOOD PRESSURE    Dispense:  90 tablet    Refill:  1  . metFORMIN (GLUCOPHAGE) 1000 MG tablet    Sig: Take 1 tablet (1,000 mg total) by mouth 2 (two) times daily with a meal.    Dispense:  180 tablet    Refill:  0  . atorvastatin (LIPITOR) 40 MG tablet    Sig: TAKE 1 TABLET(40 MG) BY MOUTH DAILY    Dispense:  90 tablet    Refill:  0  . tamsulosin (FLOMAX) 0.4 MG CAPS capsule    Sig: Take 1 capsule (0.4 mg total) by mouth daily.    Dispense:  30 capsule    Refill:  2     Follow-up: Return in about 3 months (around 11/10/2019) for diabetes.  Claretta Fraise, M.D.

## 2019-08-11 ENCOUNTER — Encounter: Payer: Self-pay | Admitting: Family Medicine

## 2019-08-11 LAB — CBC WITH DIFFERENTIAL/PLATELET
Basophils Absolute: 0 10*3/uL (ref 0.0–0.2)
Basos: 1 %
EOS (ABSOLUTE): 0.3 10*3/uL (ref 0.0–0.4)
Eos: 5 %
Hematocrit: 45.3 % (ref 37.5–51.0)
Hemoglobin: 15.8 g/dL (ref 13.0–17.7)
Immature Grans (Abs): 0 10*3/uL (ref 0.0–0.1)
Immature Granulocytes: 0 %
Lymphocytes Absolute: 2.3 10*3/uL (ref 0.7–3.1)
Lymphs: 44 %
MCH: 29.4 pg (ref 26.6–33.0)
MCHC: 34.9 g/dL (ref 31.5–35.7)
MCV: 84 fL (ref 79–97)
Monocytes Absolute: 0.4 10*3/uL (ref 0.1–0.9)
Monocytes: 8 %
Neutrophils Absolute: 2.2 10*3/uL (ref 1.4–7.0)
Neutrophils: 42 %
Platelets: 268 10*3/uL (ref 150–450)
RBC: 5.38 x10E6/uL (ref 4.14–5.80)
RDW: 13.1 % (ref 11.6–15.4)
WBC: 5.2 10*3/uL (ref 3.4–10.8)

## 2019-08-11 LAB — CMP14+EGFR
ALT: 14 IU/L (ref 0–44)
AST: 13 IU/L (ref 0–40)
Albumin/Globulin Ratio: 1.5 (ref 1.2–2.2)
Albumin: 4.2 g/dL (ref 3.8–4.8)
Alkaline Phosphatase: 75 IU/L (ref 48–121)
BUN/Creatinine Ratio: 11 (ref 10–24)
BUN: 10 mg/dL (ref 8–27)
Bilirubin Total: 0.5 mg/dL (ref 0.0–1.2)
CO2: 23 mmol/L (ref 20–29)
Calcium: 9.1 mg/dL (ref 8.6–10.2)
Chloride: 100 mmol/L (ref 96–106)
Creatinine, Ser: 0.91 mg/dL (ref 0.76–1.27)
GFR calc Af Amer: 104 mL/min/{1.73_m2} (ref >59)
GFR calc non Af Amer: 90 mL/min/{1.73_m2} (ref >59)
Globulin, Total: 2.8 g/dL (ref 1.5–4.5)
Glucose: 158 mg/dL — ABNORMAL HIGH (ref 65–99)
Potassium: 4.3 mmol/L (ref 3.5–5.2)
Sodium: 138 mmol/L (ref 134–144)
Total Protein: 7 g/dL (ref 6.0–8.5)

## 2019-08-11 LAB — LIPID PANEL
Chol/HDL Ratio: 4 ratio (ref 0.0–5.0)
Cholesterol, Total: 202 mg/dL — ABNORMAL HIGH (ref 100–199)
HDL: 51 mg/dL (ref >39)
LDL Chol Calc (NIH): 137 mg/dL — ABNORMAL HIGH (ref 0–99)
Triglycerides: 79 mg/dL (ref 0–149)
VLDL Cholesterol Cal: 14 mg/dL (ref 5–40)

## 2019-08-11 LAB — MICROALBUMIN / CREATININE URINE RATIO
Creatinine, Urine: 117.8 mg/dL
Microalb/Creat Ratio: 34 mg/g creat — ABNORMAL HIGH (ref 0–29)
Microalbumin, Urine: 39.7 ug/mL

## 2019-09-18 LAB — HM DIABETES EYE EXAM

## 2019-11-13 ENCOUNTER — Ambulatory Visit: Payer: BC Managed Care – PPO | Admitting: Family Medicine

## 2019-11-22 ENCOUNTER — Ambulatory Visit: Payer: BC Managed Care – PPO | Admitting: Family Medicine

## 2019-12-06 ENCOUNTER — Ambulatory Visit: Payer: BC Managed Care – PPO | Admitting: Family Medicine

## 2019-12-07 ENCOUNTER — Ambulatory Visit: Payer: BC Managed Care – PPO | Admitting: Family Medicine

## 2019-12-08 ENCOUNTER — Encounter: Payer: Self-pay | Admitting: Family Medicine

## 2020-06-03 ENCOUNTER — Encounter: Payer: Self-pay | Admitting: Family Medicine

## 2020-06-03 ENCOUNTER — Other Ambulatory Visit: Payer: Self-pay

## 2020-06-03 ENCOUNTER — Ambulatory Visit (INDEPENDENT_AMBULATORY_CARE_PROVIDER_SITE_OTHER): Payer: BC Managed Care – PPO | Admitting: Family Medicine

## 2020-06-03 ENCOUNTER — Ambulatory Visit (INDEPENDENT_AMBULATORY_CARE_PROVIDER_SITE_OTHER): Payer: BC Managed Care – PPO

## 2020-06-03 VITALS — BP 139/83 | HR 87 | Temp 96.3°F | Ht 72.0 in | Wt 238.0 lb

## 2020-06-03 DIAGNOSIS — K429 Umbilical hernia without obstruction or gangrene: Secondary | ICD-10-CM | POA: Diagnosis not present

## 2020-06-03 DIAGNOSIS — R1033 Periumbilical pain: Secondary | ICD-10-CM

## 2020-06-03 DIAGNOSIS — R102 Pelvic and perineal pain: Secondary | ICD-10-CM

## 2020-06-03 DIAGNOSIS — E119 Type 2 diabetes mellitus without complications: Secondary | ICD-10-CM | POA: Diagnosis not present

## 2020-06-03 LAB — URINALYSIS
Bilirubin, UA: NEGATIVE
Leukocytes,UA: NEGATIVE
Nitrite, UA: NEGATIVE
RBC, UA: NEGATIVE
Specific Gravity, UA: >1.03 — ABNORMAL HIGH (ref 1.005–1.030)
Urobilinogen, Ur: 1 mg/dL (ref 0.2–1.0)
pH, UA: 5 (ref 5.0–7.5)

## 2020-06-03 LAB — GLUCOSE HEMOCUE WAIVED: Glu Hemocue Waived: 199 mg/dL — ABNORMAL HIGH (ref 65–99)

## 2020-06-03 NOTE — Patient Instructions (Signed)
Use Magnesium citrate 10 oz today and daily for three days until good result from bowels. Use Miralax (glycolax)  Also, one capful twice daily for bowel movements

## 2020-06-03 NOTE — Progress Notes (Signed)
Subjective:  Patient ID: Tyler Burnett, male    DOB: 02/15/1956  Age: 65 y.o. MRN: [id number]  CC: Abdominal Pain and Constipation   HPI Tyler Burnett presents for Abd hernia.night before last had knifing pain across midabd at umbilicus. Discomfort persists. Groin pain for one week. Urine bright orange, yellow. DCed all drinks except water. Drinking 6-8 bottles of water. Lemonade made the pain worse. Not checking glucose.    History Tyler Burnett has a past medical history of Diabetes mellitus without complication ([place]) and Hypertension.   He has a past surgical history that includes Fracture surgery and arm surgery (1980).   His family history includes Aneurysm in his mother; Heart disease in his father.He reports that he has never smoked. He has never used smokeless tobacco. He reports that he does not drink alcohol and does not use drugs.  Current Outpatient Medications on File Prior to Visit  Medication Sig Dispense Refill  . amLODipine (NORVASC) 10 MG tablet TAKE 1 TABLET BY MOUTH DAILY FOR BLOOD PRESSURE 90 tablet 1  . atorvastatin (LIPITOR) 40 MG tablet TAKE 1 TABLET(40 MG) BY MOUTH DAILY 90 tablet 0  . lisinopril (ZESTRIL) 10 MG tablet Take 1 tablet (10 mg total) by mouth daily. 90 tablet 3  . metFORMIN (GLUCOPHAGE) 1000 MG tablet Take 1 tablet (1,000 mg total) by mouth 2 (two) times daily with a meal. 180 tablet 0  . tamsulosin (FLOMAX) 0.4 MG CAPS capsule Take 1 capsule (0.4 mg total) by mouth daily. 30 capsule 2   No current facility-administered medications on file prior to visit.    ROS Review of Systems  Constitutional: Negative for chills, diaphoresis, fever and unexpected weight change.  HENT: Negative for rhinorrhea and trouble swallowing.   Respiratory: Negative for cough, chest tightness and shortness of breath.   Cardiovascular: Negative for chest pain.  Gastrointestinal: Positive for abdominal pain. Negative for abdominal distention, blood in stool, constipation,  diarrhea, nausea, rectal pain and vomiting.  Genitourinary: Negative for dysuria, flank pain and hematuria.  Musculoskeletal: Negative for arthralgias and joint swelling.  Skin: Negative for rash.  Neurological: Negative for syncope and headaches.    Objective:  BP 139/83   Pulse 87   Temp (!) 96.3 F (35.7 C)   Ht 6' (1.829 m)   Wt 238 lb (108 kg)   SpO2 97%   BMI 32.28 kg/m   BP Readings from Last 3 Encounters:  06/03/20 139/83  08/10/19 (!) 148/88  04/19/19 132/73    Wt Readings from Last 3 Encounters:  06/03/20 238 lb (108 kg)  08/10/19 247 lb 4 oz (112.2 kg)  04/19/19 245 lb 12.8 oz (111.5 kg)     Physical Exam Vitals reviewed.  Constitutional:      Appearance: He is well-developed.  HENT:     Head: Normocephalic and atraumatic.     Right Ear: Tympanic membrane and external ear normal. No decreased hearing noted.     Left Ear: Tympanic membrane and external ear normal. No decreased hearing noted.     Mouth/Throat:     Pharynx: No oropharyngeal exudate or posterior oropharyngeal erythema.  Eyes:     Pupils: Pupils are equal, round, and reactive to light.  Cardiovascular:     Rate and Rhythm: Normal rate and regular rhythm.     Heart sounds: No murmur heard.   Pulmonary:     Effort: No respiratory distress.     Breath sounds: Normal breath sounds.  Abdominal:     General: Bowel  sounds are normal.     Palpations: Abdomen is soft. There is no mass.     Tenderness: There is abdominal tenderness in the periumbilical area and suprapubic area. There is no right CVA tenderness, left CVA tenderness, guarding or rebound. Negative signs include Murphy's sign, McBurney's sign and psoas sign.     Hernia: A hernia is present. Hernia is present in the umbilical area. There is no hernia in the left inguinal area or right inguinal area.  Musculoskeletal:     Cervical back: Normal range of motion and neck supple.       Assessment & Plan:   Tyler Burnett was seen today for  abdominal pain and constipation.  Diagnoses and all orders for this visit:  Type 2 diabetes mellitus without complication, without long-term current use of insulin (HCC) -     Glucose Hemocue Waived -     CBC with Differential/Platelet -     CMP14+EGFR  Acute suprapubic pain -     CBC with Differential/Platelet -     CMP14+EGFR -     Urinalysis -     Urine Culture  Periumbilical abdominal pain -     CBC with Differential/Platelet -     CMP14+EGFR -     Lipase -     DG Abd 2 Views; Future -     Urinalysis -     Urine Culture  Umbilical hernia without obstruction and without gangrene -     CBC with Differential/Platelet -     CMP14+EGFR      I am having Tyler Burnett maintain his lisinopril, amLODipine, metFORMIN, atorvastatin, and tamsulosin.  No orders of the defined types were placed in this encounter.    Follow-up: Return in about 1 week (around 06/10/2020).  Claretta Fraise, M.D.

## 2020-06-04 LAB — CBC WITH DIFFERENTIAL/PLATELET
Basophils Absolute: 0 10*3/uL (ref 0.0–0.2)
Basos: 0 %
EOS (ABSOLUTE): 0.1 10*3/uL (ref 0.0–0.4)
Eos: 1 %
Hematocrit: 48.1 % (ref 37.5–51.0)
Hemoglobin: 16.5 g/dL (ref 13.0–17.7)
Immature Grans (Abs): 0 10*3/uL (ref 0.0–0.1)
Immature Granulocytes: 0 %
Lymphocytes Absolute: 1.7 10*3/uL (ref 0.7–3.1)
Lymphs: 19 %
MCH: 29.1 pg (ref 26.6–33.0)
MCHC: 34.3 g/dL (ref 31.5–35.7)
MCV: 85 fL (ref 79–97)
Monocytes Absolute: 0.7 10*3/uL (ref 0.1–0.9)
Monocytes: 8 %
Neutrophils Absolute: 6.6 10*3/uL (ref 1.4–7.0)
Neutrophils: 72 %
Platelets: 342 10*3/uL (ref 150–450)
RBC: 5.67 x10E6/uL (ref 4.14–5.80)
RDW: 12.1 % (ref 11.6–15.4)
WBC: 9.2 10*3/uL (ref 3.4–10.8)

## 2020-06-04 LAB — CMP14+EGFR
ALT: 20 IU/L (ref 0–44)
AST: 19 IU/L (ref 0–40)
Albumin/Globulin Ratio: 1.2 (ref 1.2–2.2)
Albumin: 4.2 g/dL (ref 3.8–4.8)
Alkaline Phosphatase: 103 IU/L (ref 44–121)
BUN/Creatinine Ratio: 10 (ref 10–24)
BUN: 9 mg/dL (ref 8–27)
Bilirubin Total: 0.9 mg/dL (ref 0.0–1.2)
CO2: 23 mmol/L (ref 20–29)
Calcium: 9.2 mg/dL (ref 8.6–10.2)
Chloride: 93 mmol/L — ABNORMAL LOW (ref 96–106)
Creatinine, Ser: 0.94 mg/dL (ref 0.76–1.27)
Globulin, Total: 3.5 g/dL (ref 1.5–4.5)
Glucose: 202 mg/dL — ABNORMAL HIGH (ref 65–99)
Potassium: 4.4 mmol/L (ref 3.5–5.2)
Sodium: 133 mmol/L — ABNORMAL LOW (ref 134–144)
Total Protein: 7.7 g/dL (ref 6.0–8.5)
eGFR: 91 mL/min/{1.73_m2} (ref >59)

## 2020-06-04 LAB — URINE CULTURE: Organism ID, Bacteria: NO GROWTH

## 2020-06-04 LAB — LIPASE: Lipase: 39 U/L (ref 13–78)

## 2020-06-05 ENCOUNTER — Other Ambulatory Visit: Payer: Self-pay | Admitting: Family Medicine

## 2020-06-05 ENCOUNTER — Telehealth: Payer: Self-pay | Admitting: Family Medicine

## 2020-06-05 MED ORDER — LINACLOTIDE 290 MCG PO CAPS: 290.0000 ug | ORAL_CAPSULE | Freq: Every day | ORAL | 2 refills | Status: DC

## 2020-06-05 NOTE — Telephone Encounter (Signed)
Please let the patient know that I sent their prescription to their pharmacy. Thanks, WS 

## 2020-06-05 NOTE — Telephone Encounter (Signed)
Pt stated the medicine that [facility] gave him to help make a bowl movement is not helping and wants to know if there is anything else you can recommend

## 2020-06-05 NOTE — Telephone Encounter (Signed)
Patient aware.

## 2020-06-06 LAB — OSMOLALITY: Osmolality Meas: 280 mOsmol/kg (ref 280–301)

## 2020-06-06 LAB — SPECIMEN STATUS REPORT

## 2020-06-10 ENCOUNTER — Other Ambulatory Visit: Payer: Self-pay | Admitting: Family Medicine

## 2020-06-10 DIAGNOSIS — R809 Proteinuria, unspecified: Secondary | ICD-10-CM

## 2020-06-10 DIAGNOSIS — E871 Hypo-osmolality and hyponatremia: Secondary | ICD-10-CM

## 2020-06-10 MED ORDER — VIBERZI 100 MG PO TABS: 100.0000 mg | ORAL_TABLET | Freq: Two times a day (BID) | ORAL | 5 refills | Status: DC

## 2020-06-10 NOTE — Progress Notes (Signed)
[email or web address]

## 2020-06-13 ENCOUNTER — Encounter: Payer: Self-pay | Admitting: Family Medicine

## 2020-06-13 ENCOUNTER — Other Ambulatory Visit: Payer: Self-pay

## 2020-06-13 ENCOUNTER — Ambulatory Visit: Payer: BC Managed Care – PPO | Admitting: Family Medicine

## 2020-06-13 VITALS — BP 140/77 | HR 65 | Temp 96.5°F | Ht 72.0 in | Wt 231.4 lb

## 2020-06-13 DIAGNOSIS — R1084 Generalized abdominal pain: Secondary | ICD-10-CM | POA: Diagnosis not present

## 2020-06-13 DIAGNOSIS — I10 Essential (primary) hypertension: Secondary | ICD-10-CM

## 2020-06-13 DIAGNOSIS — E119 Type 2 diabetes mellitus without complications: Secondary | ICD-10-CM | POA: Diagnosis not present

## 2020-06-13 DIAGNOSIS — E782 Mixed hyperlipidemia: Secondary | ICD-10-CM

## 2020-06-13 LAB — BAYER DCA HB A1C WAIVED: HB A1C (BAYER DCA - WAIVED): 8.3 % — ABNORMAL HIGH (ref <7.0)

## 2020-06-13 MED ORDER — LINACLOTIDE 290 MCG PO CAPS: 290.0000 ug | ORAL_CAPSULE | Freq: Every day | ORAL | 2 refills | Status: DC

## 2020-06-13 MED ORDER — POLYETHYLENE GLYCOL 3350 17 GM/SCOOP PO POWD: 17.0000 g | Freq: Two times a day (BID) | ORAL | 5 refills | Status: DC | PRN

## 2020-06-13 NOTE — Progress Notes (Signed)
Subjective:  Patient ID: Tyler Burnett, male    DOB: 03-07-1955  Age: 65 y.o. MRN: [id number]  CC: Follow-up   HPI Tyler Burnett presents for continued feeling of being full avoiding heavy fods. Using linzess. Takiung it with apple sauce. Passing a lot of gas. Feeling bloated. Stomach pain "gripes as well. " Getting better, but not comletely gone,   presents forFollow-up of diabetes. Patient checks blood sugar at home.   Patient denies symptoms such as polyuria, polydipsia, excessive hunger, nausea No significant hypoglycemic spells noted. Medications reviewed. Pt reports taking them regularly without complication/adverse reaction being reported today.    Depression screen [facility] 2/9 06/13/2020 06/03/2020 08/10/2019  Decreased Interest 0 0 0  Down, Depressed, Hopeless 0 0 0  PHQ - 2 Score 0 0 0  Altered sleeping - - -  Tired, decreased energy - - -  Change in appetite - - -  Feeling bad or failure about yourself  - - -  Trouble concentrating - - -  Moving slowly or fidgety/restless - - -  Suicidal thoughts - - -  PHQ-9 Score - - -  Difficult doing work/chores - - -    History Tyler Burnett has a past medical history of Diabetes mellitus without complication (HCC) and Hypertension.   He has a past surgical history that includes Fracture surgery and arm surgery (1980).   His family history includes Aneurysm in his mother; Heart disease in his father.He reports that he has never smoked. He has never used smokeless tobacco. He reports that he does not drink alcohol and does not use drugs.    ROS Review of Systems  Constitutional: Negative for chills, diaphoresis, fever and unexpected weight change.  HENT: Negative for rhinorrhea and trouble swallowing.   Respiratory: Negative for cough, chest tightness and shortness of breath.   Cardiovascular: Negative for chest pain.  Gastrointestinal: Positive for abdominal pain. Negative for abdominal distention, blood in stool, constipation,  diarrhea, nausea, rectal pain and vomiting.  Genitourinary: Negative for dysuria, flank pain and hematuria.  Musculoskeletal: Negative for arthralgias and joint swelling.  Skin: Negative for rash.  Neurological: Negative for syncope and headaches.    Objective:  BP 140/77   Pulse 65   Temp (!) 96.5 F (35.8 C)   Ht 6' (1.829 m)   Wt 231 lb 6.4 oz (105 kg)   SpO2 95%   BMI 31.38 kg/m   BP Readings from Last 3 Encounters:  06/13/20 140/77  06/03/20 139/83  08/10/19 (!) 148/88    Wt Readings from Last 3 Encounters:  06/13/20 231 lb 6.4 oz (105 kg)  06/03/20 238 lb (108 kg)  08/10/19 247 lb 4 oz (112.2 kg)     Physical Exam Vitals reviewed.  Constitutional:      Appearance: He is well-developed.  HENT:     Head: Normocephalic and atraumatic.     Right Ear: Tympanic membrane and external ear normal. No decreased hearing noted.     Left Ear: Tympanic membrane and external ear normal. No decreased hearing noted.     Mouth/Throat:     Pharynx: No oropharyngeal exudate or posterior oropharyngeal erythema.  Eyes:     Pupils: Pupils are equal, round, and reactive to light.  Cardiovascular:     Rate and Rhythm: Normal rate and regular rhythm.     Heart sounds: No murmur heard.   Pulmonary:     Effort: No respiratory distress.     Breath sounds: Normal breath sounds.  Abdominal:  General: Bowel sounds are normal. There is distension (mild).     Palpations: Abdomen is soft. There is no mass.     Tenderness: There is no abdominal tenderness (diffuse, minimal severity).  Musculoskeletal:     Cervical back: Normal range of motion and neck supple.    Results for orders placed or performed in visit on 06/13/20  CMP14+EGFR  Result Value Ref Range   Glucose 123 (H) 65 - 99 mg/dL   BUN 9 8 - 27 mg/dL   Creatinine, Ser 0.93 0.76 - 1.27 mg/dL   eGFR 92 >59 mL/min/1.73   BUN/Creatinine Ratio 10 10 - 24   Sodium 139 134 - 144 mmol/L   Potassium 5.2 3.5 - 5.2 mmol/L    Chloride 100 96 - 106 mmol/L   CO2 23 20 - 29 mmol/L   Calcium 9.3 8.6 - 10.2 mg/dL   Total Protein 7.3 6.0 - 8.5 g/dL   Albumin 3.8 3.8 - 4.8 g/dL   Globulin, Total 3.5 1.5 - 4.5 g/dL   Albumin/Globulin Ratio 1.1 (L) 1.2 - 2.2   Bilirubin Total 0.2 0.0 - 1.2 mg/dL   Alkaline Phosphatase 85 44 - 121 IU/L   AST 23 0 - 40 IU/L   ALT 27 0 - 44 IU/L  Lipase  Result Value Ref Range   Lipase 42 13 - 78 U/L  Bayer DCA Hb A1c Waived  Result Value Ref Range   HB A1C (BAYER DCA - WAIVED) 8.3 (H) <7.0 %  CBC with Differential/Platelet  Result Value Ref Range   WBC 5.5 3.4 - 10.8 x10E3/uL   RBC 5.08 4.14 - 5.80 x10E6/uL   Hemoglobin 14.6 13.0 - 17.7 g/dL   Hematocrit 44.2 37.5 - 51.0 %   MCV 87 79 - 97 fL   MCH 28.7 26.6 - 33.0 pg   MCHC 33.0 31.5 - 35.7 g/dL   RDW 12.5 11.6 - 15.4 %   Platelets 423 150 - 450 x10E3/uL   Neutrophils 44 Not Estab. %   Lymphs 43 Not Estab. %   Monocytes 8 Not Estab. %   Eos 4 Not Estab. %   Basos 0 Not Estab. %   Neutrophils Absolute 2.4 1.4 - 7.0 x10E3/uL   Lymphocytes Absolute 2.4 0.7 - 3.1 x10E3/uL   Monocytes Absolute 0.4 0.1 - 0.9 x10E3/uL   EOS (ABSOLUTE) 0.2 0.0 - 0.4 x10E3/uL   Basophils Absolute 0.0 0.0 - 0.2 x10E3/uL   Immature Granulocytes 1 Not Estab. %   Immature Grans (Abs) 0.0 0.0 - 0.1 x10E3/uL  Lipid panel  Result Value Ref Range   Cholesterol, Total 160 100 - 199 mg/dL   Triglycerides 137 0 - 149 mg/dL   HDL 36 (L) >39 mg/dL   VLDL Cholesterol Cal 25 5 - 40 mg/dL   LDL Chol Calc (NIH) 99 0 - 99 mg/dL   Chol/HDL Ratio 4.4 0.0 - 5.0 ratio      Assessment & Plan:   Lorrie was seen today for follow-up.  Diagnoses and all orders for this visit:  Type 2 diabetes mellitus without complication, without long-term current use of insulin (HCC) -     Bayer DCA Hb A1c Waived -     CBC with Differential/Platelet -     Lipid panel  Essential hypertension, benign  Mixed hyperlipidemia -     Lipid panel  Generalized abdominal  pain -     CMP14+EGFR -     Lipase  Other orders -  linaclotide (LINZESS) 290 MCG CAPS capsule; Take 1 capsule (290 mcg total) by mouth daily. To regulate bowel movements -     polyethylene glycol powder (GLYCOLAX/MIRALAX) 17 GM/SCOOP powder; Take 17 g by mouth 2 (two) times daily as needed for moderate constipation. For constipation       I have discontinued Colin Larocque's Viberzi. I am also having him start on polyethylene glycol powder. Additionally, I am having him maintain his lisinopril, amLODipine, metFORMIN, atorvastatin, tamsulosin, and linaclotide.  Allergies as of 06/13/2020      Reactions   Doxycycline Other (See Comments)   Abdominal pain      Medication List       Accurate as of June 13, 2020 11:59 PM. If you have any questions, ask your nurse or doctor.        STOP taking these medications   Viberzi 100 MG Tabs Generic drug: Eluxadoline Stopped by: Claretta Fraise, MD     TAKE these medications   amLODipine 10 MG tablet Commonly known as: NORVASC TAKE 1 TABLET BY MOUTH DAILY FOR BLOOD PRESSURE   atorvastatin 40 MG tablet Commonly known as: LIPITOR TAKE 1 TABLET(40 MG) BY MOUTH DAILY   linaclotide 290 MCG Caps capsule Commonly known as: Linzess Take 1 capsule (290 mcg total) by mouth daily. To regulate bowel movements Started by: Claretta Fraise, MD   lisinopril 10 MG tablet Commonly known as: ZESTRIL Take 1 tablet (10 mg total) by mouth daily.   metFORMIN 1000 MG tablet Commonly known as: GLUCOPHAGE Take 1 tablet (1,000 mg total) by mouth 2 (two) times daily with a meal.   polyethylene glycol powder 17 GM/SCOOP powder Commonly known as: GLYCOLAX/MIRALAX Take 17 g by mouth 2 (two) times daily as needed for moderate constipation. For constipation Started by: Claretta Fraise, MD   tamsulosin 0.4 MG Caps capsule Commonly known as: FLOMAX Take 1 capsule (0.4 mg total) by mouth daily.        Follow-up: Return in about 2 weeks (around  06/27/2020), or if symptoms worsen or fail to improve.  Claretta Fraise, M.D.

## 2020-06-14 LAB — CBC WITH DIFFERENTIAL/PLATELET
Basophils Absolute: 0 10*3/uL (ref 0.0–0.2)
Basos: 0 %
EOS (ABSOLUTE): 0.2 10*3/uL (ref 0.0–0.4)
Eos: 4 %
Hematocrit: 44.2 % (ref 37.5–51.0)
Hemoglobin: 14.6 g/dL (ref 13.0–17.7)
Immature Grans (Abs): 0 10*3/uL (ref 0.0–0.1)
Immature Granulocytes: 1 %
Lymphocytes Absolute: 2.4 10*3/uL (ref 0.7–3.1)
Lymphs: 43 %
MCH: 28.7 pg (ref 26.6–33.0)
MCHC: 33 g/dL (ref 31.5–35.7)
MCV: 87 fL (ref 79–97)
Monocytes Absolute: 0.4 10*3/uL (ref 0.1–0.9)
Monocytes: 8 %
Neutrophils Absolute: 2.4 10*3/uL (ref 1.4–7.0)
Neutrophils: 44 %
Platelets: 423 10*3/uL (ref 150–450)
RBC: 5.08 x10E6/uL (ref 4.14–5.80)
RDW: 12.5 % (ref 11.6–15.4)
WBC: 5.5 10*3/uL (ref 3.4–10.8)

## 2020-06-14 LAB — CMP14+EGFR
ALT: 27 IU/L (ref 0–44)
AST: 23 IU/L (ref 0–40)
Albumin/Globulin Ratio: 1.1 — ABNORMAL LOW (ref 1.2–2.2)
Albumin: 3.8 g/dL (ref 3.8–4.8)
Alkaline Phosphatase: 85 IU/L (ref 44–121)
BUN/Creatinine Ratio: 10 (ref 10–24)
BUN: 9 mg/dL (ref 8–27)
Bilirubin Total: 0.2 mg/dL (ref 0.0–1.2)
CO2: 23 mmol/L (ref 20–29)
Calcium: 9.3 mg/dL (ref 8.6–10.2)
Chloride: 100 mmol/L (ref 96–106)
Creatinine, Ser: 0.93 mg/dL (ref 0.76–1.27)
Globulin, Total: 3.5 g/dL (ref 1.5–4.5)
Glucose: 123 mg/dL — ABNORMAL HIGH (ref 65–99)
Potassium: 5.2 mmol/L (ref 3.5–5.2)
Sodium: 139 mmol/L (ref 134–144)
Total Protein: 7.3 g/dL (ref 6.0–8.5)
eGFR: 92 mL/min/{1.73_m2} (ref >59)

## 2020-06-14 LAB — LIPID PANEL
Chol/HDL Ratio: 4.4 ratio (ref 0.0–5.0)
Cholesterol, Total: 160 mg/dL (ref 100–199)
HDL: 36 mg/dL — ABNORMAL LOW (ref >39)
LDL Chol Calc (NIH): 99 mg/dL (ref 0–99)
Triglycerides: 137 mg/dL (ref 0–149)
VLDL Cholesterol Cal: 25 mg/dL (ref 5–40)

## 2020-06-14 LAB — LIPASE: Lipase: 42 U/L (ref 13–78)

## 2020-06-17 ENCOUNTER — Encounter: Payer: Self-pay | Admitting: *Deleted

## 2020-06-18 ENCOUNTER — Encounter: Payer: Self-pay | Admitting: Family Medicine

## 2020-09-27 ENCOUNTER — Other Ambulatory Visit: Payer: Self-pay

## 2020-09-27 ENCOUNTER — Ambulatory Visit (INDEPENDENT_AMBULATORY_CARE_PROVIDER_SITE_OTHER): Payer: BC Managed Care – PPO | Admitting: Nurse Practitioner

## 2020-09-27 ENCOUNTER — Encounter: Payer: Self-pay | Admitting: Nurse Practitioner

## 2020-09-27 VITALS — BP 137/78 | HR 72 | Temp 97.0°F | Ht 72.0 in | Wt 242.0 lb

## 2020-09-27 DIAGNOSIS — R079 Chest pain, unspecified: Secondary | ICD-10-CM | POA: Diagnosis not present

## 2020-09-27 DIAGNOSIS — R072 Precordial pain: Secondary | ICD-10-CM | POA: Insufficient documentation

## 2020-09-27 MED ORDER — IBUPROFEN 600 MG PO TABS: 600.0000 mg | ORAL_TABLET | Freq: Three times a day (TID) | ORAL | 0 refills | Status: DC | PRN

## 2020-09-27 NOTE — Progress Notes (Signed)
Acute Office Visit  Subjective:    Patient ID: Tyler Burnett, male    DOB: 1955/05/02, 65 y.o.   MRN: [id number]  No chief complaint on file.   HPI Patient is in today for Chest Pain  This is a recurrent problem. The current episode started in the past 7 days. The onset quality is gradual. The problem occurs daily. The problem has been unchanged. Pain location: Right side chest. The pain is at a severity of 5/10. The pain is moderate. The quality of the pain is described as dull and heavy. The pain does not radiate. Pertinent negatives include no abdominal pain, back pain, cough, dizziness, leg pain or nausea. The pain is aggravated by nothing. He has tried nothing for the symptoms. Risk factors include male gender and obesity. His past medical history is significant for diabetes and hypertension. His family medical history is significant for heart disease. Prior workup: EKG.   Past Medical History:  Diagnosis Date   Diabetes mellitus without complication ([place])    Hypertension     Past Surgical History:  Procedure Laterality Date   arm surgery  1980   had broke in 9 places    FRACTURE SURGERY      Family History  Problem Relation Age of Onset   Aneurysm Mother    Heart disease Father     Social History   Socioeconomic History   Marital status: Married    Spouse name: Not on file   Number of children: Not on file   Years of education: Not on file   Highest education level: Not on file  Occupational History   Not on file  Tobacco Use   Smoking status: Never   Smokeless tobacco: Never  Vaping Use   Vaping Use: Never used  Substance and Sexual Activity   Alcohol use: No    Comment: occasion 2 drinks per year   Drug use: No   Sexual activity: Yes    Partners: Female  Other Topics Concern   Not on file  Social History Narrative   Not on file   Social Determinants of Health   Financial Resource Strain: Not on file  Food Insecurity: Not on file   Transportation Needs: Not on file  Physical Activity: Not on file  Stress: Not on file  Social Connections: Not on file  Intimate Partner Violence: Not on file    Outpatient Medications Prior to Visit  Medication Sig Dispense Refill   atorvastatin (LIPITOR) 40 MG tablet TAKE 1 TABLET(40 MG) BY MOUTH DAILY 90 tablet 0   lisinopril (ZESTRIL) 10 MG tablet Take 1 tablet (10 mg total) by mouth daily. 90 tablet 3   metFORMIN (GLUCOPHAGE) 1000 MG tablet Take 1 tablet (1,000 mg total) by mouth 2 (two) times daily with a meal. 180 tablet 0   amLODipine (NORVASC) 10 MG tablet TAKE 1 TABLET BY MOUTH DAILY FOR BLOOD PRESSURE (Patient not taking: Reported on 09/27/2020) 90 tablet 1   linaclotide (LINZESS) 290 MCG CAPS capsule Take 1 capsule (290 mcg total) by mouth daily. To regulate bowel movements 30 capsule 2   polyethylene glycol powder (GLYCOLAX/MIRALAX) 17 GM/SCOOP powder Take 17 g by mouth 2 (two) times daily as needed for moderate constipation. For constipation 3350 g 5   tamsulosin (FLOMAX) 0.4 MG CAPS capsule Take 1 capsule (0.4 mg total) by mouth daily. 30 capsule 2   No facility-administered medications prior to visit.    Allergies  Allergen Reactions   Doxycycline Other (  See Comments)    Abdominal pain    Review of Systems  Constitutional: Negative.   HENT: Negative.    Eyes: Negative.   Cardiovascular:  Positive for chest pain. Negative for palpitations and leg swelling.  Gastrointestinal: Negative.   Skin:  Negative for rash.  All other systems reviewed and are negative.     Objective:    Physical Exam Vitals and nursing note reviewed.  Constitutional:      Appearance: He is obese.  HENT:     Head: Normocephalic.     Nose: Nose normal.  Eyes:     Conjunctiva/sclera: Conjunctivae normal.  Cardiovascular:     Rate and Rhythm: Normal rate and regular rhythm.     Pulses: Normal pulses.     Heart sounds: Normal heart sounds. No murmur heard. Pulmonary:     Effort:  Pulmonary effort is normal.     Breath sounds: Normal breath sounds.  Abdominal:     General: Bowel sounds are normal.  Skin:    Findings: No rash.  Neurological:     Mental Status: He is alert and oriented to person, place, and time.  Psychiatric:        Behavior: Behavior normal.    BP 137/78   Pulse 72   Temp (!) 97 F (36.1 C) (Temporal)   Ht 6' (1.829 m)   Wt 242 lb (109.8 kg)   SpO2 96%   BMI 32.82 kg/m  Wt Readings from Last 3 Encounters:  09/27/20 242 lb (109.8 kg)  06/13/20 231 lb 6.4 oz (105 kg)  06/03/20 238 lb (108 kg)    Health Maintenance Due  Topic Date Due   PNEUMOCOCCAL POLYSACCHARIDE VACCINE AGE 58-64 HIGH RISK  Never done   COVID-19 Vaccine (1) Never done   Zoster Vaccines- Shingrix (1 of 2) Never done   FOOT EXAM  08/18/2018   COLONOSCOPY (Pts 45-26yrs Insurance coverage will need to be confirmed)  09/06/2019   OPHTHALMOLOGY EXAM  09/17/2020    There are no preventive care reminders to display for this patient.   No results found for: TSH Lab Results  Component Value Date   WBC 5.5 06/13/2020   HGB 14.6 06/13/2020   HCT 44.2 06/13/2020   MCV 87 06/13/2020   PLT 423 06/13/2020   Lab Results  Component Value Date   NA 139 06/13/2020   K 5.2 06/13/2020   CO2 23 06/13/2020   GLUCOSE 123 (H) 06/13/2020   BUN 9 06/13/2020   CREATININE 0.93 06/13/2020   BILITOT 0.2 06/13/2020   ALKPHOS 85 06/13/2020   AST 23 06/13/2020   ALT 27 06/13/2020   PROT 7.3 06/13/2020   ALBUMIN 3.8 06/13/2020   CALCIUM 9.3 06/13/2020   EGFR 92 06/13/2020   Lab Results  Component Value Date   CHOL 160 06/13/2020   Lab Results  Component Value Date   HDL 36 (L) 06/13/2020   Lab Results  Component Value Date   LDLCALC 99 06/13/2020   Lab Results  Component Value Date   TRIG 137 06/13/2020   Lab Results  Component Value Date   CHOLHDL 4.4 06/13/2020   Lab Results  Component Value Date   HGBA1C 8.3 (H) 06/13/2020       Assessment & Plan:    Problem List Items Addressed This Visit       Other   Chest pain - Primary    Ongoing chest pain for the last 7 days.  Pain is worse in the  morning and stops by meat day and evening.  Pain does not radiate, no fatigue or shortness of breath associated with symptoms.  Patient reports sometimes pain is debilitating and causes him not to be able to get out of bed in the morning.  Completed EKG in clinic today with normal sinus rhythm.  Referral to cardiology completed for further assessment and echocardiogram if necessary.  Advised patient to use ibuprofen for chest/chest wall pain.  Follow-up with worsening or unresolved symptoms. Encourage patients to emergency care over the weekend ongoing chest pain without resolution  Patient verbalized understanding Rx sent to pharmacy.       Relevant Medications   ibuprofen (ADVIL) 600 MG tablet   Other Relevant Orders   EKG 12-Lead (Completed)   Ambulatory referral to Cardiology     Meds ordered this encounter  Medications   ibuprofen (ADVIL) 600 MG tablet    Sig: Take 1 tablet (600 mg total) by mouth every 8 (eight) hours as needed.    Dispense:  30 tablet    Refill:  0    Order Specific Question:   Supervising Provider    Answer:   Janora Norlander [[id number]]     Ivy Lynn, NP

## 2020-09-27 NOTE — Assessment & Plan Note (Signed)
Ongoing chest pain for the last 7 days.  Pain is worse in the morning and stops by meat day and evening.  Pain does not radiate, no fatigue or shortness of breath associated with symptoms.  Patient reports sometimes pain is debilitating and causes him not to be able to get out of bed in the morning.  Completed EKG in clinic today with normal sinus rhythm.  Referral to cardiology completed for further assessment and echocardiogram if necessary.  Advised patient to use ibuprofen for chest/chest wall pain.  Follow-up with worsening or unresolved symptoms. Encourage patients to emergency care over the weekend ongoing chest pain without resolution  Patient verbalized understanding Rx sent to pharmacy.

## 2020-09-27 NOTE — Patient Instructions (Signed)
Nonspecific Chest Pain Chest pain can be caused by many different conditions. Some causes of chest pain can be life-threatening. These will require treatment right away. Serious causes of chest pain include: Heart attack. A tear in the body's main blood vessel. Redness and swelling (inflammation) around your heart. Blood clot in your lungs. Other causes of chest pain may not be so serious. These include: Heartburn. Anxiety or stress. Damage to bones or muscles in your chest. Lung infections. Chest pain can feel like: Pain or discomfort in your chest. Crushing, pressure, aching, or squeezing pain. Burning or tingling. Dull or sharp pain that is worse when you move, cough, or take a deep breath. Pain or discomfort that is also felt in your back, neck, jaw, shoulder, or arm, or pain that spreads to any of these areas. It is hard to know whether your pain is caused by something that is serious or something that is not so serious. So it is important to see your doctor rightaway if you have chest pain. Follow these instructions at home: Medicines Take over-the-counter and prescription medicines only as told by your doctor. If you were prescribed an antibiotic medicine, take it as told by your doctor. Do not stop taking the antibiotic even if you start to feel better. Lifestyle  Rest as told by your doctor. Do not use any products that contain nicotine or tobacco, such as cigarettes, e-cigarettes, and chewing tobacco. If you need help quitting, ask your doctor. Do not drink alcohol. Make lifestyle changes as told by your doctor. These may include: Getting regular exercise. Ask your doctor what activities are safe for you. Eating a heart-healthy diet. A diet and nutrition specialist (dietitian) can help you to learn healthy eating options. Staying at a healthy weight. Treating diabetes or high blood pressure, if needed. Lowering your stress. Activities such as yoga and relaxation techniques  can help.  General instructions Pay attention to any changes in your symptoms. Tell your doctor about them or any new symptoms. Avoid any activities that cause chest pain. Keep all follow-up visits as told by your doctor. This is important. You may need more testing if your chest pain does not go away. Contact a doctor if: Your chest pain does not go away. You feel depressed. You have a fever. Get help right away if: Your chest pain is worse. You have a cough that gets worse, or you cough up blood. You have very bad (severe) pain in your belly (abdomen). You pass out (faint). You have either of these for no clear reason: Sudden chest discomfort. Sudden discomfort in your arms, back, neck, or jaw. You have shortness of breath at any time. You suddenly start to sweat, or your skin gets clammy. You feel sick to your stomach (nauseous). You throw up (vomit). You suddenly feel lightheaded or dizzy. You feel very weak or tired. Your heart starts to beat fast, or it feels like it is skipping beats. These symptoms may be an emergency. Do not wait to see if the symptoms will go away. Get medical help right away. Call your local emergency services (911 in the [place] Do not drive yourself to the hospital. Summary Chest pain can be caused by many different conditions. The cause may be serious and need treatment right away. If you have chest pain, see your doctor right away. Follow your doctor's instructions for taking medicines and making lifestyle changes. Keep all follow-up visits as told by your doctor. This includes visits for any further  testing if your chest pain does not go away. Be sure to know the signs that show that your condition has become worse. Get help right away if you have these symptoms. This information is not intended to replace advice given to you by your health care provider. Make sure you discuss any questions you have with your healthcare provider. Document Revised:  08/19/2017 Document Reviewed: 08/19/2017 Elsevier Patient Education  2022 [employer] Chest Wall Pain Chest wall pain is pain in or around the bones and muscles of your chest. Chest wall pain may be caused by: An injury. Coughing a lot. Using your chest and arm muscles too much. Sometimes, the cause may not be known. This pain may take a few weeks or longerto get better. Follow these instructions at home: Managing pain, stiffness, and swelling If told, put ice on the painful area: Put ice in a plastic bag. Place a towel between your skin and the bag. Leave the ice on for 20 minutes, 2-3 times a day.  Activity Rest as told by your doctor. Avoid doing things that cause pain. This includes lifting heavy items. Ask your doctor what activities are safe for you. General instructions  Take over-the-counter and prescription medicines only as told by your doctor. Do not use any products that contain nicotine or tobacco, such as cigarettes, e-cigarettes, and chewing tobacco. If you need help quitting, ask your doctor. Keep all follow-up visits as told by your doctor. This is important.  Contact a doctor if: You have a fever. Your chest pain gets worse. You have new symptoms. Get help right away if: You feel sick to your stomach (nauseous) or you throw up (vomit). You feel sweaty or light-headed. You have a cough with mucus from your lungs (sputum) or you cough up blood. You are short of breath. These symptoms may be an emergency. Do not wait to see if the symptoms will go away. Get medical help right away. Call your local emergency services (911 in the [place] Do not drive yourself to the hospital. Summary Chest wall pain is pain in or around the bones and muscles of your chest. It may be treated with ice, rest, and medicines. Your condition may also get better if you avoid doing things that cause pain. Contact a doctor if you have a fever, chest pain that gets worse, or new  symptoms. Get help right away if you feel light-headed or you get short of breath. These symptoms may be an emergency. This information is not intended to replace advice given to you by your health care provider. Make sure you discuss any questions you have with your healthcare provider. Document Revised: 08/19/2017 Document Reviewed: 08/19/2017 Elsevier Patient Education  2022 [employer].

## 2020-12-18 NOTE — Progress Notes (Deleted)
Cardiology Office Note   Date:  12/18/2020   ID:  Tyler Burnett, Tyler Burnett March 10, 1955, MRN [id number]  PCP:  Mechele Claude, MD  Cardiologist:   None Referring:  ***  No chief complaint on file.     History of Present Illness: Tyler Burnett is a 65 y.o. male who presents for evaluation of chest pain.  He is referred by ***      Past Medical History:  Diagnosis Date   Diabetes mellitus without complication ([facility])    Hypertension     Past Surgical History:  Procedure Laterality Date   arm surgery  1980   had broke in 9 places    FRACTURE SURGERY       Current Outpatient Medications  Medication Sig Dispense Refill   amLODipine (NORVASC) 10 MG tablet TAKE 1 TABLET BY MOUTH DAILY FOR BLOOD PRESSURE (Patient not taking: Reported on 09/27/2020) 90 tablet 1   atorvastatin (LIPITOR) 40 MG tablet TAKE 1 TABLET(40 MG) BY MOUTH DAILY 90 tablet 0   ibuprofen (ADVIL) 600 MG tablet Take 1 tablet (600 mg total) by mouth every 8 (eight) hours as needed. 30 tablet 0   lisinopril (ZESTRIL) 10 MG tablet Take 1 tablet (10 mg total) by mouth daily. 90 tablet 3   metFORMIN (GLUCOPHAGE) 1000 MG tablet Take 1 tablet (1,000 mg total) by mouth 2 (two) times daily with a meal. 180 tablet 0   No current facility-administered medications for this visit.    Allergies:   Doxycycline    Social History:  The patient  reports that he has never smoked. He has never used smokeless tobacco. He reports that he does not drink alcohol and does not use drugs.   Family History:  The patient's ***family history includes Aneurysm in his mother; Heart disease in his father.    ROS:  Please see the history of present illness.   Otherwise, review of systems are positive for {NONE DEFAULTED:18576}.   All other systems are reviewed and negative.    PHYSICAL EXAM: VS:  There were no vitals taken for this visit. , BMI There is no height or weight on file to calculate BMI. GENERAL:  Well appearing HEENT:  Pupils  equal round and reactive, fundi not visualized, oral mucosa unremarkable NECK:  No jugular venous distention, waveform within normal limits, carotid upstroke brisk and symmetric, no bruits, no thyromegaly LYMPHATICS:  No cervical, inguinal adenopathy LUNGS:  Clear to auscultation bilaterally BACK:  No CVA tenderness CHEST:  Unremarkable HEART:  PMI not displaced or sustained,S1 and S2 within normal limits, no S3, no S4, no clicks, no rubs, *** murmurs ABD:  Flat, positive bowel sounds normal in frequency in pitch, no bruits, no rebound, no guarding, no midline pulsatile mass, no hepatomegaly, no splenomegaly EXT:  2 plus pulses throughout, no edema, no cyanosis no clubbing SKIN:  No rashes no nodules NEURO:  Cranial nerves II through XII grossly intact, motor grossly intact throughout PSYCH:  Cognitively intact, oriented to person place and time    EKG:  EKG {ACTION; IS/IS ZWC:[id number]} ordered today. The ekg ordered today demonstrates ***   Recent Labs: 06/13/2020: ALT 27; BUN 9; Creatinine, Ser 0.93; Hemoglobin 14.6; Platelets 423; Potassium 5.2; Sodium 139    Lipid Panel    Component Value Date/Time   CHOL 160 06/13/2020 1655   TRIG 137 06/13/2020 1655   HDL 36 (L) 06/13/2020 1655   CHOLHDL 4.4 06/13/2020 1655   LDLCALC 99 06/13/2020 1655  Wt Readings from Last 3 Encounters:  09/27/20 242 lb (109.8 kg)  06/13/20 231 lb 6.4 oz (105 kg)  06/03/20 238 lb (108 kg)      Other studies Reviewed: Additional studies/ records that were reviewed today include: ***. Review of the above records demonstrates:  Please see elsewhere in the note.  ***   ASSESSMENT AND PLAN:  PRECORDIAL CHEST PAIN:  ***  DM:  ***  HTN:  ***      Current medicines are reviewed at length with the patient today.  The patient {ACTIONS; HAS/DOES NOT HAVE:19233} concerns regarding medicines.  The following changes have been made:  {PLAN; NO CHANGE:13088:s}  Labs/ tests ordered today include:  *** No orders of the defined types were placed in this encounter.    Disposition:   FU with ***    Signed, Rollene Rotunda, MD  12/18/2020 7:58 AM    [facility]

## 2020-12-19 ENCOUNTER — Ambulatory Visit: Payer: BC Managed Care – PPO | Admitting: Cardiology

## 2021-01-31 ENCOUNTER — Ambulatory Visit: Payer: BC Managed Care – PPO | Admitting: Nurse Practitioner

## 2021-01-31 ENCOUNTER — Encounter: Payer: Self-pay | Admitting: Nurse Practitioner

## 2021-01-31 ENCOUNTER — Ambulatory Visit: Payer: Self-pay | Admitting: Cardiology

## 2021-01-31 VITALS — BP 164/77 | HR 73 | Temp 97.3°F | Resp 20 | Ht 72.0 in | Wt 244.0 lb

## 2021-01-31 DIAGNOSIS — J029 Acute pharyngitis, unspecified: Secondary | ICD-10-CM | POA: Diagnosis not present

## 2021-01-31 LAB — RAPID STREP SCREEN (MED CTR MEBANE ONLY): Strep Gp A Ag, IA W/Reflex: NEGATIVE

## 2021-01-31 LAB — CULTURE, GROUP A STREP

## 2021-01-31 MED ORDER — AMOXICILLIN-POT CLAVULANATE 875-125 MG PO TABS: 1.0000 | ORAL_TABLET | Freq: Two times a day (BID) | ORAL | 0 refills | Status: DC

## 2021-01-31 MED ORDER — DM-GUAIFENESIN ER 30-600 MG PO TB12: 1.0000 | ORAL_TABLET | Freq: Two times a day (BID) | ORAL | 0 refills | Status: DC

## 2021-01-31 NOTE — Progress Notes (Signed)
Acute Office Visit  Subjective:    Patient ID: Tyler Burnett, male    DOB: 03-17-1955, 65 y.o.   MRN: [id number]  No chief complaint on file.   Sore Throat  This is a new problem. Episode onset: In the past 3 days. The problem has been gradually worsening. There has been no fever. The pain is moderate. Associated symptoms include congestion, coughing and ear pain. He has had no exposure to strep. Treatments tried: Cough drops. The treatment provided no relief.    Past Medical History:  Diagnosis Date   Diabetes mellitus without complication ([place])    Hypertension     Past Surgical History:  Procedure Laterality Date   arm surgery  1980   had broke in 9 places    FRACTURE SURGERY      Family History  Problem Relation Age of Onset   Aneurysm Mother    Heart disease Father     Social History   Socioeconomic History   Marital status: Married    Spouse name: Not on file   Number of children: Not on file   Years of education: Not on file   Highest education level: Not on file  Occupational History   Not on file  Tobacco Use   Smoking status: Never   Smokeless tobacco: Never  Vaping Use   Vaping Use: Never used  Substance and Sexual Activity   Alcohol use: No    Comment: occasion 2 drinks per year   Drug use: No   Sexual activity: Yes    Partners: Female  Other Topics Concern   Not on file  Social History Narrative   Not on file   Social Determinants of Health   Financial Resource Strain: Not on file  Food Insecurity: Not on file  Transportation Needs: Not on file  Physical Activity: Not on file  Stress: Not on file  Social Connections: Not on file  Intimate Partner Violence: Not on file    Outpatient Medications Prior to Visit  Medication Sig Dispense Refill   atorvastatin (LIPITOR) 40 MG tablet TAKE 1 TABLET(40 MG) BY MOUTH DAILY 90 tablet 0   ibuprofen (ADVIL) 600 MG tablet Take 1 tablet (600 mg total) by mouth every 8 (eight) hours as needed. 30  tablet 0   lisinopril (ZESTRIL) 10 MG tablet Take 1 tablet (10 mg total) by mouth daily. 90 tablet 3   metFORMIN (GLUCOPHAGE) 1000 MG tablet Take 1 tablet (1,000 mg total) by mouth 2 (two) times daily with a meal. 180 tablet 0   amLODipine (NORVASC) 10 MG tablet TAKE 1 TABLET BY MOUTH DAILY FOR BLOOD PRESSURE (Patient not taking: Reported on 09/27/2020) 90 tablet 1   No facility-administered medications prior to visit.    Allergies  Allergen Reactions   Doxycycline Other (See Comments)    Abdominal pain    Review of Systems  Constitutional:  Positive for chills and fatigue.  HENT:  Positive for congestion, ear pain and sore throat. Negative for sinus pressure.   Respiratory:  Positive for cough.   Gastrointestinal:  Negative for nausea.  Skin:  Negative for rash.  All other systems reviewed and are negative.     Objective:    Physical Exam Vitals and nursing note reviewed.  Constitutional:      Appearance: Normal appearance.  HENT:     Head: Normocephalic.     Right Ear: External ear normal.     Nose: Nose normal.     Mouth/Throat:  Lips: Pink.     Mouth: Mucous membranes are moist.     Pharynx: Pharyngeal swelling and posterior oropharyngeal erythema present.  Eyes:     Conjunctiva/sclera: Conjunctivae normal.  Cardiovascular:     Rate and Rhythm: Normal rate and regular rhythm.     Pulses: Normal pulses.     Heart sounds: Normal heart sounds.  Pulmonary:     Effort: Pulmonary effort is normal.     Breath sounds: Normal breath sounds.  Abdominal:     General: Bowel sounds are normal.  Skin:    General: Skin is warm.     Findings: No rash.  Neurological:     Mental Status: He is alert and oriented to person, place, and time.  Psychiatric:        Behavior: Behavior normal.    BP (!) 164/77   Pulse 73   Temp (!) 97.3 F (36.3 C) (Temporal)   Resp 20   Ht 6' (1.829 m)   Wt 244 lb (110.7 kg)   SpO2 97%   BMI 33.09 kg/m  Wt Readings from Last 3  Encounters:  01/31/21 244 lb (110.7 kg)  09/27/20 242 lb (109.8 kg)  06/13/20 231 lb 6.4 oz (105 kg)    Health Maintenance Due  Topic Date Due   COVID-19 Vaccine (1) Never done   Pneumococcal Vaccine 32-33 Years old (1 - PCV) Never done   Zoster Vaccines- Shingrix (1 of 2) Never done   FOOT EXAM  08/18/2018   COLONOSCOPY (Pts 45-75yr Insurance coverage will need to be confirmed)  09/06/2019   OPHTHALMOLOGY EXAM  09/17/2020   HEMOGLOBIN A1C  12/13/2020    There are no preventive care reminders to display for this patient.   No results found for: TSH Lab Results  Component Value Date   WBC 5.5 06/13/2020   HGB 14.6 06/13/2020   HCT 44.2 06/13/2020   MCV 87 06/13/2020   PLT 423 06/13/2020   Lab Results  Component Value Date   NA 139 06/13/2020   K 5.2 06/13/2020   CO2 23 06/13/2020   GLUCOSE 123 (H) 06/13/2020   BUN 9 06/13/2020   CREATININE 0.93 06/13/2020   BILITOT 0.2 06/13/2020   ALKPHOS 85 06/13/2020   AST 23 06/13/2020   ALT 27 06/13/2020   PROT 7.3 06/13/2020   ALBUMIN 3.8 06/13/2020   CALCIUM 9.3 06/13/2020   EGFR 92 06/13/2020   Lab Results  Component Value Date   CHOL 160 06/13/2020   Lab Results  Component Value Date   HDL 36 (L) 06/13/2020   Lab Results  Component Value Date   LDLCALC 99 06/13/2020   Lab Results  Component Value Date   TRIG 137 06/13/2020   Lab Results  Component Value Date   CHOLHDL 4.4 06/13/2020   Lab Results  Component Value Date   HGBA1C 8.3 (H) 06/13/2020       Assessment & Plan:   Problem List Items Addressed This Visit       Respiratory   Pharyngitis - Primary    Take meds as prescribed - Use a cool mist humidifier  -Use saline nose sprays frequently -Force fluids -For fever or aches or pains- take Tylenol or ibuprofen. -Strep/RSV swab completed results pending. -Guaifenesin for cough and congestion -Amoxicillin 875-125 mg tablet by mouth -If symptoms do not improve, she may need to be COVID  tested to rule this out Follow up with worsening unresolved symptoms      Relevant Medications  amoxicillin-clavulanate (AUGMENTIN) 875-125 MG tablet   dextromethorphan-guaiFENesin (MUCINEX DM) 30-600 MG 12hr tablet   Other Relevant Orders   RSV(respiratory syncytial virus) ab, bld   Rapid Strep Screen ([facility] ONLY)     Meds ordered this encounter  Medications   amoxicillin-clavulanate (AUGMENTIN) 875-125 MG tablet    Sig: Take 1 tablet by mouth 2 (two) times daily.    Dispense:  20 tablet    Refill:  0    Order Specific Question:   Supervising Provider    Answer:   Jeneen Rinks   dextromethorphan-guaiFENesin (MUCINEX DM) 30-600 MG 12hr tablet    Sig: Take 1 tablet by mouth 2 (two) times daily.    Dispense:  30 tablet    Refill:  0    Order Specific Question:   Supervising Provider    Answer:   Claretta Fraise [[id number]]     Ivy Lynn, NP

## 2021-01-31 NOTE — Patient Instructions (Signed)
Sore Throat When you have a sore throat, your throat may feel: Tender. Burning. Irritated. Scratchy. Painful when you swallow. Painful when you talk. Many things can cause a sore throat, such as: An infection. Allergies. Dry air. Smoke or pollution. Radiation treatment for cancer. Gastroesophageal reflux disease (GERD). A tumor. A sore throat can be the first sign of another sickness. It can happen with other problems, like: Coughing. Sneezing. Fever. Swelling of the glands in the neck. Most sore throats go away without treatment. Follow these instructions at home:   Medicines Take over-the-counter and prescription medicines only as told by your doctor. Children often get sore throats. Do not give your child aspirin. Use throat sprays to soothe your throat as told by your health care provider. Managing pain To help with pain: Sip warm liquids, such as broth, herbal tea, or warm water. Eat or drink cold or frozen liquids, such as frozen ice pops. Rinse your mouth (gargle) with a salt water mixture 3-4 times a day or as needed. To make salt water, dissolve -1 tsp (3-6 g) of salt in 1 cup (237 mL) of warm water. Do not swallow this mixture. Suck on hard candy or throat lozenges. Put a cool-mist humidifier in your bedroom at night. Sit in the bathroom with the door closed for 5-10 minutes while you run hot water in the shower. General instructions Do not smoke or use any products that contain nicotine or tobacco. If you need help quitting, ask your doctor. Get plenty of rest. Drink enough fluid to keep your pee (urine) pale yellow. Wash your hands often for at least 20 seconds with soap and water. If soap and water are not available, use hand sanitizer. Contact a doctor if: You have a fever for more than 2-3 days. You keep having symptoms for more than 2-3 days. Your throat does not get better in 7 days. You have a fever and your symptoms suddenly get worse. Your child  who is 3 months to 3 years old has a temperature of 102.2F (39C) or higher. Get help right away if: You have trouble breathing. You cannot swallow fluids, soft foods, or your spit. You have swelling in your throat or neck that gets worse. You feel like you may vomit (nauseous) and this feeling lasts a long time. You cannot stop vomiting. These symptoms may be an emergency. Get help right away. Call your local emergency services (911 in the [place] Do not wait to see if the symptoms will go away. Do not drive yourself to the hospital. Summary A sore throat is a painful, burning, irritated, or scratchy throat. Many things can cause a sore throat. Take over-the-counter medicines only as told by your doctor. Get plenty of rest. Drink enough fluid to keep your pee (urine) pale yellow. Contact a doctor if your symptoms get worse or your sore throat does not get better within 7 days. This information is not intended to replace advice given to you by your health care provider. Make sure you discuss any questions you have with your health care provider. Document Revised: 05/15/2020 Document Reviewed: 05/15/2020 Elsevier Patient Education  2022 [employer]  

## 2021-01-31 NOTE — Assessment & Plan Note (Signed)
Take meds as prescribed - Use a cool mist humidifier  -Use saline nose sprays frequently -Force fluids -For fever or aches or pains- take Tylenol or ibuprofen. -Strep/RSV swab completed results pending. -Guaifenesin for cough and congestion -Amoxicillin 875-125 mg tablet by mouth -If symptoms do not improve, she may need to be COVID tested to rule this out Follow up with worsening unresolved symptoms

## 2021-01-31 NOTE — Addendum Note (Signed)
Addended by: Quay Burow on: 01/31/2021 10:08 AM   Modules accepted: Orders

## 2021-02-01 LAB — RSV AG, EIA: RSV Ag, EIA: NEGATIVE

## 2021-02-06 ENCOUNTER — Ambulatory Visit: Payer: Self-pay | Admitting: Cardiovascular Disease

## 2021-02-12 ENCOUNTER — Telehealth: Payer: Self-pay | Admitting: Family Medicine

## 2021-02-12 NOTE — Telephone Encounter (Signed)
Pt had an appt 12/2 and he was given antibiotics. He is now out of antibiotics and said that he was feeling better while taking them but since he stopped he has started to feel worse again. Can anything else be called in for him? Please call back and advise.

## 2021-02-13 NOTE — Telephone Encounter (Signed)
Appointment scheduled.

## 2021-02-14 ENCOUNTER — Encounter: Payer: Self-pay | Admitting: Nurse Practitioner

## 2021-02-14 ENCOUNTER — Encounter: Payer: Self-pay | Admitting: *Deleted

## 2021-02-14 ENCOUNTER — Ambulatory Visit (INDEPENDENT_AMBULATORY_CARE_PROVIDER_SITE_OTHER): Payer: BC Managed Care – PPO | Admitting: Nurse Practitioner

## 2021-02-14 ENCOUNTER — Other Ambulatory Visit: Payer: Self-pay

## 2021-02-14 VITALS — BP 136/74 | HR 70 | Temp 98.0°F | Resp 22 | Ht 72.0 in | Wt 247.0 lb

## 2021-02-14 DIAGNOSIS — J029 Acute pharyngitis, unspecified: Secondary | ICD-10-CM

## 2021-02-14 DIAGNOSIS — J40 Bronchitis, not specified as acute or chronic: Secondary | ICD-10-CM | POA: Diagnosis not present

## 2021-02-14 LAB — CULTURE, GROUP A STREP

## 2021-02-14 LAB — RAPID STREP SCREEN (MED CTR MEBANE ONLY): Strep Gp A Ag, IA W/Reflex: NEGATIVE

## 2021-02-14 MED ORDER — METHYLPREDNISOLONE ACETATE 80 MG/ML IJ SUSP
80.0000 mg | Freq: Once | INTRAMUSCULAR | Status: AC
  Administered 2021-02-14: 80 mg via INTRAMUSCULAR

## 2021-02-14 MED ORDER — PREDNISONE 20 MG PO TABS: ORAL_TABLET | ORAL | 0 refills | Status: DC

## 2021-02-14 NOTE — Patient Instructions (Signed)
Acute Bronchitis, Adult °Acute bronchitis is sudden inflammation of the main airways (bronchi) that come off the windpipe (trachea) in the lungs. The swelling causes the airways to get smaller and make more mucus than normal. This can make it hard to breathe and can cause coughing or noisy breathing (wheezing). °Acute bronchitis may last several weeks. The cough may last longer. Allergies, asthma, and exposure to smoke may make the condition worse. °What are the causes? °This condition can be caused by germs and by substances that irritate the lungs, including: °Cold and flu viruses. The most common cause of this condition is the virus that causes the common cold. °Bacteria. This is less common. °Breathing in substances that irritate the lungs, including: °Smoke from cigarettes and other forms of tobacco. °Dust and pollen. °Fumes from household cleaning products, gases, or burned fuel. °Indoor or outdoor air pollution. °What increases the risk? °The following factors may make you more likely to develop this condition: °A weak body's defense system, also called the immune system. °A condition that affects your lungs and breathing, such as asthma. °What are the signs or symptoms? °Common symptoms of this condition include: °Coughing. This may bring up clear, yellow, or green mucus from your lungs (sputum). °Wheezing. °Runny or stuffy nose. °Having too much mucus in your lungs (chest congestion). °Shortness of breath. °Aches and pains, including sore throat or chest. °How is this diagnosed? °This condition is usually diagnosed based on: °Your symptoms and medical history. °A physical exam. °You may also have other tests, including tests to rule out other conditions, such as pneumonia. These tests include: °A test of lung function. °Test of a mucus sample to look for the presence of bacteria. °Tests to check the oxygen level in your blood. °Blood tests. °Chest X-ray. °How is this treated? °Most cases of acute bronchitis  clear up over time without treatment. Your health care provider may recommend: °Drinking more fluids to help thin your mucus so it is easier to cough up. °Taking inhaled medicine (inhaler) to improve air flow in and out of your lungs. °Using a vaporizer or a humidifier. These are machines that add water to the air to help you breathe better. °Taking a medicine that thins mucus and clears congestion (expectorant). °Taking a medicine that prevents or stops coughing (cough suppressant). °It is notcommon to take an antibiotic medicine for this condition. °Follow these instructions at home: ° °Take over-the-counter and prescription medicines only as told by your health care provider. °Use an inhaler, vaporizer, or humidifier as told by your health care provider. °Take two teaspoons (10 mL) of honey at bedtime to lessen coughing at night. °Drink enough fluid to keep your urine pale yellow. °Do not use any products that contain nicotine or tobacco. These products include cigarettes, chewing tobacco, and vaping devices, such as e-cigarettes. If you need help quitting, ask your health care provider. °Get plenty of rest. °Return to your normal activities as told by your health care provider. Ask your health care provider what activities are safe for you. °Keep all follow-up visits. This is important. °How is this prevented? °To lower your risk of getting this condition again: °Wash your hands often with soap and water for at least 20 seconds. If soap and water are not available, use hand sanitizer. °Avoid contact with people who have cold symptoms. °Try not to touch your mouth, nose, or eyes with your hands. °Avoid breathing in smoke or chemical fumes. Breathing smoke or chemical fumes will make your condition   worse. °Get the flu shot every year. °Contact a health care provider if: °Your symptoms do not improve after 2 weeks. °You have trouble coughing up the mucus. °Your cough keeps you awake at night. °You have a  fever. °Get help right away if you: °Cough up blood. °Feel pain in your chest. °Have severe shortness of breath. °Faint or keep feeling like you are going to faint. °Have a severe headache. °Have a fever or chills that get worse. °These symptoms may represent a serious problem that is an emergency. Do not wait to see if the symptoms will go away. Get medical help right away. Call your local emergency services (911 in the [place] Do not drive yourself to the hospital. °Summary °Acute bronchitis is inflammation of the main airways (bronchi) that come off the windpipe (trachea) in the lungs. The swelling causes the airways to get smaller and make more mucus than normal. °Drinking more fluids can help thin your mucus so it is easier to cough up. °Take over-the-counter and prescription medicines only as told by your health care provider. °Do not use any products that contain nicotine or tobacco. These products include cigarettes, chewing tobacco, and vaping devices, such as e-cigarettes. If you need help quitting, ask your health care provider. °Contact a health care provider if your symptoms do not improve after 2 weeks. °This information is not intended to replace advice given to you by your health care provider. Make sure you discuss any questions you have with your health care provider. °Document Revised: 06/19/2020 Document Reviewed: 06/19/2020 °Elsevier Patient Education © 2022 [employer] Inc. ° °

## 2021-02-14 NOTE — Progress Notes (Signed)
Subjective:    Patient ID: Tyler Burnett, male    DOB: 22-Jul-1955, 65 y.o.   MRN: [id number]   Chief Complaint: Sore Throat and Cough   HPI Patient was seen earlier this month and was dx with pharyngitis. He was prescribed augmentin. He has completed that course of meds. Now he says that throat is still worse. Painful to swallow on left. He has a deep cough and feels like phlegm is hung in his throat.    Review of Systems  Constitutional:  Negative for chills and fever.  HENT:  Positive for congestion, postnasal drip, sore throat and voice change. Negative for sinus pressure and sinus pain.   Respiratory:  Positive for cough. Negative for chest tightness.   Cardiovascular:  Negative for chest pain, palpitations and leg swelling.  Musculoskeletal:  Negative for myalgias.  Neurological:  Negative for dizziness and headaches.  All other systems reviewed and are negative.     Objective:   Physical Exam Vitals and nursing note reviewed.  Constitutional:      Appearance: He is well-developed. He is obese.  Cardiovascular:     Rate and Rhythm: Normal rate and regular rhythm.  Pulmonary:     Breath sounds: Normal breath sounds.  Musculoskeletal:     Cervical back: Normal range of motion and neck supple.  Skin:    General: Skin is warm.  Neurological:     General: No focal deficit present.     Mental Status: He is alert and oriented to person, place, and time.  Psychiatric:        Mood and Affect: Mood normal.        Behavior: Behavior normal.    BP 136/74    Pulse 70    Temp 98 F (36.7 C)    Resp (!) 22    Ht 6' (1.829 m)    Wt 247 lb (112 kg)    SpO2 95%    BMI 33.50 kg/m        Assessment & Plan:  Coleman Hoar in today with chief complaint of Sore Throat and Cough   1. Sore throat - Rapid Strep Screen ([facility] ONLY) - Culture, Group A Strep  2. Pharyngitis, unspecified etiology  3. Bronchitis 1. Take meds as prescribed 2. Use a cool mist humidifier  especially during the winter months and when heat has been humid. 3. Use saline nose sprays frequently 4. Saline irrigations of the nose can be very helpful if done frequently.  * 4X daily for 1 week*  * Use of a nettie pot can be helpful with this. Follow directions with this* 5. Drink plenty of fluids 6. Keep thermostat turn down low 7.For any cough or congestion- mucinex 8. For fever or aces or pains- take tylenol or ibuprofen appropriate for age and weight.  * for fevers greater than 101 orally you may alternate ibuprofen and tylenol every  3 hours.    - methylPREDNISolone acetate (DEPO-MEDROL) injection 80 mg - predniSONE (DELTASONE) 20 MG tablet; 2 po at sametime daily for 5 days-  Dispense: 10 tablet; Refill: 0  Previous note reviewed  The above assessment and management plan was discussed with the patient. The patient verbalized understanding of and has agreed to the management plan. Patient is aware to call the clinic if symptoms persist or worsen. Patient is aware when to return to the clinic for a follow-up visit. Patient educated on when it is appropriate to go to the emergency department.  Mary-Margaret Hassell Done, FNP

## 2021-02-16 LAB — CULTURE, GROUP A STREP: Strep A Culture: NEGATIVE

## 2021-03-18 ENCOUNTER — Ambulatory Visit: Payer: Self-pay | Admitting: Cardiology

## 2021-05-19 ENCOUNTER — Telehealth: Payer: Self-pay | Admitting: Family Medicine

## 2021-05-19 MED ORDER — ATORVASTATIN CALCIUM 40 MG PO TABS: ORAL_TABLET | ORAL | 0 refills | Status: DC

## 2021-05-19 NOTE — Telephone Encounter (Signed)
Patient aware and verbalized understanding. 30 day supply sent  ?

## 2021-05-26 ENCOUNTER — Encounter: Payer: Self-pay | Admitting: Family Medicine

## 2021-05-26 ENCOUNTER — Ambulatory Visit: Payer: BC Managed Care – PPO | Admitting: Family Medicine

## 2021-05-26 VITALS — BP 136/73 | HR 63 | Temp 96.4°F | Ht 72.0 in | Wt 250.0 lb

## 2021-05-26 DIAGNOSIS — E119 Type 2 diabetes mellitus without complications: Secondary | ICD-10-CM

## 2021-05-26 DIAGNOSIS — E782 Mixed hyperlipidemia: Secondary | ICD-10-CM | POA: Diagnosis not present

## 2021-05-26 DIAGNOSIS — I10 Essential (primary) hypertension: Secondary | ICD-10-CM

## 2021-05-26 DIAGNOSIS — R1319 Other dysphagia: Secondary | ICD-10-CM | POA: Diagnosis not present

## 2021-05-26 LAB — BAYER DCA HB A1C WAIVED: HB A1C (BAYER DCA - WAIVED): 10.4 % — ABNORMAL HIGH (ref 4.8–5.6)

## 2021-05-26 MED ORDER — ATORVASTATIN CALCIUM 40 MG PO TABS: ORAL_TABLET | ORAL | 3 refills | Status: DC

## 2021-05-26 MED ORDER — LISINOPRIL 10 MG PO TABS: 10.0000 mg | ORAL_TABLET | Freq: Every day | ORAL | 3 refills | Status: DC

## 2021-05-26 MED ORDER — METFORMIN HCL 1000 MG PO TABS: 1000.0000 mg | ORAL_TABLET | Freq: Two times a day (BID) | ORAL | 3 refills | Status: DC

## 2021-05-26 MED ORDER — AMLODIPINE BESYLATE 10 MG PO TABS: ORAL_TABLET | ORAL | 3 refills | Status: DC

## 2021-05-26 NOTE — Progress Notes (Signed)
? ?Subjective:  ?Patient ID: Tyler Burnett,  ?male    DOB: 07/09/1955  Age: 66 y.o.  ? ? ?CC: Medical Management of Chronic Issues ? ? ?HPI ?Tyler Burnett presents for  follow-up of hypertension. Patient has no history of headache chest pain or shortness of breath or recent cough. Patient also denies symptoms of TIA such as numbness weakness lateralizing. Patient denies side effects from medication. States taking it regularly.Home readings 244 or more systolic. ? ?Patient also  in for follow-up of elevated cholesterol. Doing well without complaints on current medication. Denies side effects  including myalgia and arthralgia and nausea. Also in today for liver function testing. Currently no chest pain, shortness of breath or other cardiovascular related symptoms noted. ? ?Follow-up of diabetes. Patient does not check blood sugar at home.Patient denies symptoms such as excessive hunger or urinary frequency, excessive hunger, nausea ?No significant hypoglycemic spells noted. ?Medications reviewed. Pt reports taking them regularly. Pt. denies complication/adverse reaction today.  ? ? ?History ?Tyler Burnett has a past medical history of Diabetes mellitus without complication ([place]) and Hypertension.  ? ?He has a past surgical history that includes Fracture surgery and arm surgery (1980).  ? ?His family history includes Aneurysm in his mother; Heart disease in his father.He reports that he has never smoked. He has never used smokeless tobacco. He reports that he does not drink alcohol and does not use drugs. ? ?No current outpatient medications on file prior to visit.  ? ?No current facility-administered medications on file prior to visit.  ? ? ?ROS ?Review of Systems  ?Constitutional:  Negative for fever.  ?HENT:  Positive for trouble swallowing (dysphagia for 4-5 days. Occurred 2 months ago. Felt like he had to vomit to bring it up then swallow. Resolved when he stopped eating fast food).   ?Eyes:  Positive for visual  disturbance (for a minute or two for the last month).  ?Respiratory:  Negative for shortness of breath.   ?Cardiovascular:  Negative for chest pain.  ?Musculoskeletal:  Negative for arthralgias.  ?Skin:  Negative for rash.  ? ?Objective:  ?BP 136/73   Pulse 63   Temp (!) 96.4 ?F (35.8 ?C)   Ht 6' (1.829 m)   Wt 250 lb (113.4 kg)   SpO2 95%   BMI 33.91 kg/m?  ? ?BP Readings from Last 3 Encounters:  ?05/26/21 136/73  ?02/14/21 136/74  ?01/31/21 (!) 164/77  ? ? ?Wt Readings from Last 3 Encounters:  ?05/26/21 250 lb (113.4 kg)  ?02/14/21 247 lb (112 kg)  ?01/31/21 244 lb (110.7 kg)  ? ? ? ?Physical Exam ?Vitals reviewed.  ?Constitutional:   ?   Appearance: He is well-developed.  ?HENT:  ?   Head: Normocephalic and atraumatic.  ?   Right Ear: External ear normal.  ?   Left Ear: External ear normal.  ?   Mouth/Throat:  ?   Pharynx: No oropharyngeal exudate or posterior oropharyngeal erythema.  ?Eyes:  ?   Pupils: Pupils are equal, round, and reactive to light.  ?Cardiovascular:  ?   Rate and Rhythm: Normal rate and regular rhythm.  ?   Heart sounds: No murmur heard. ?Pulmonary:  ?   Effort: No respiratory distress.  ?   Breath sounds: Normal breath sounds.  ?Musculoskeletal:  ?   Cervical back: Normal range of motion and neck supple.  ?Neurological:  ?   Mental Status: He is alert and oriented to person, place, and time.  ? ? ? ? ? ? ? ?  Assessment & Plan:  ? ?Tyler Burnett was seen today for medical management of chronic issues. ? ?Diagnoses and all orders for this visit: ? ?Type 2 diabetes mellitus without complication, without long-term current use of insulin ([place]) ?-     Bayer DCA Hb A1c Waived ? ?Essential hypertension, benign ?-     CBC with Differential/Platelet ?-     CMP14+EGFR ? ?Mixed hyperlipidemia ?-     Lipid panel ? ?Esophageal dysphagia ? ?Other orders ?-     amLODipine (NORVASC) 10 MG tablet; TAKE 1 TABLET BY MOUTH DAILY FOR BLOOD PRESSURE ?-     atorvastatin (LIPITOR) 40 MG tablet; TAKE 1 TABLET(40 MG) BY  MOUTH DAILY ?-     lisinopril (ZESTRIL) 10 MG tablet; Take 1 tablet (10 mg total) by mouth daily. ?-     metFORMIN (GLUCOPHAGE) 1000 MG tablet; Take 1 tablet (1,000 mg total) by mouth 2 (two) times daily with a meal. ? ? ?I have discontinued Tyler Burnett's ibuprofen, dextromethorphan-guaiFENesin, and predniSONE. I am also having him maintain his amLODipine, atorvastatin, lisinopril, and metFORMIN. ? ?Meds ordered this encounter  ?Medications  ? amLODipine (NORVASC) 10 MG tablet  ?  Sig: TAKE 1 TABLET BY MOUTH DAILY FOR BLOOD PRESSURE  ?  Dispense:  90 tablet  ?  Refill:  3  ? atorvastatin (LIPITOR) 40 MG tablet  ?  Sig: TAKE 1 TABLET(40 MG) BY MOUTH DAILY  ?  Dispense:  90 tablet  ?  Refill:  3  ? lisinopril (ZESTRIL) 10 MG tablet  ?  Sig: Take 1 tablet (10 mg total) by mouth daily.  ?  Dispense:  90 tablet  ?  Refill:  3  ? metFORMIN (GLUCOPHAGE) 1000 MG tablet  ?  Sig: Take 1 tablet (1,000 mg total) by mouth 2 (two) times daily with a meal.  ?  Dispense:  180 tablet  ?  Refill:  3  ? ?Since the dysphagia was resolved on its own with chsnge of lifestyle, I have instructed him to let me know if it recurs. Observation only for now. ? ?Follow-up: Return in about 3 months (around 08/26/2021). ? ?Claretta Fraise, M.D. ?

## 2021-05-27 LAB — CBC WITH DIFFERENTIAL/PLATELET
Basophils Absolute: 0 10*3/uL (ref 0.0–0.2)
Basos: 1 %
EOS (ABSOLUTE): 0.2 10*3/uL (ref 0.0–0.4)
Eos: 3 %
Hematocrit: 45.1 % (ref 37.5–51.0)
Hemoglobin: 15.7 g/dL (ref 13.0–17.7)
Immature Grans (Abs): 0 10*3/uL (ref 0.0–0.1)
Immature Granulocytes: 0 %
Lymphocytes Absolute: 2.5 10*3/uL (ref 0.7–3.1)
Lymphs: 45 %
MCH: 30.1 pg (ref 26.6–33.0)
MCHC: 34.8 g/dL (ref 31.5–35.7)
MCV: 86 fL (ref 79–97)
Monocytes Absolute: 0.4 10*3/uL (ref 0.1–0.9)
Monocytes: 7 %
Neutrophils Absolute: 2.5 10*3/uL (ref 1.4–7.0)
Neutrophils: 44 %
Platelets: 252 10*3/uL (ref 150–450)
RBC: 5.22 x10E6/uL (ref 4.14–5.80)
RDW: 12.5 % (ref 11.6–15.4)
WBC: 5.7 10*3/uL (ref 3.4–10.8)

## 2021-05-27 LAB — LIPID PANEL
Chol/HDL Ratio: 3.3 ratio (ref 0.0–5.0)
Cholesterol, Total: 156 mg/dL (ref 100–199)
HDL: 48 mg/dL (ref >39)
LDL Chol Calc (NIH): 79 mg/dL (ref 0–99)
Triglycerides: 169 mg/dL — ABNORMAL HIGH (ref 0–149)
VLDL Cholesterol Cal: 29 mg/dL (ref 5–40)

## 2021-05-27 LAB — CMP14+EGFR
ALT: 14 IU/L (ref 0–44)
AST: 14 IU/L (ref 0–40)
Albumin/Globulin Ratio: 1.5 (ref 1.2–2.2)
Albumin: 4.1 g/dL (ref 3.8–4.8)
Alkaline Phosphatase: 83 IU/L (ref 44–121)
BUN/Creatinine Ratio: 12 (ref 10–24)
BUN: 10 mg/dL (ref 8–27)
Bilirubin Total: 0.3 mg/dL (ref 0.0–1.2)
CO2: 25 mmol/L (ref 20–29)
Calcium: 9.5 mg/dL (ref 8.6–10.2)
Chloride: 98 mmol/L (ref 96–106)
Creatinine, Ser: 0.85 mg/dL (ref 0.76–1.27)
Globulin, Total: 2.7 g/dL (ref 1.5–4.5)
Glucose: 218 mg/dL — ABNORMAL HIGH (ref 70–99)
Potassium: 4.4 mmol/L (ref 3.5–5.2)
Sodium: 136 mmol/L (ref 134–144)
Total Protein: 6.8 g/dL (ref 6.0–8.5)
eGFR: 97 mL/min/{1.73_m2} (ref >59)

## 2021-06-26 ENCOUNTER — Ambulatory Visit: Payer: BC Managed Care – PPO | Admitting: Family Medicine

## 2021-06-26 ENCOUNTER — Encounter: Payer: Self-pay | Admitting: Family Medicine

## 2021-06-26 VITALS — BP 129/73 | HR 62 | Temp 98.9°F | Ht 72.0 in | Wt 247.0 lb

## 2021-06-26 DIAGNOSIS — S40261A Insect bite (nonvenomous) of right shoulder, initial encounter: Secondary | ICD-10-CM | POA: Diagnosis not present

## 2021-06-26 DIAGNOSIS — W57XXXA Bitten or stung by nonvenomous insect and other nonvenomous arthropods, initial encounter: Secondary | ICD-10-CM

## 2021-06-26 DIAGNOSIS — S30860A Insect bite (nonvenomous) of lower back and pelvis, initial encounter: Secondary | ICD-10-CM | POA: Diagnosis not present

## 2021-06-26 DIAGNOSIS — R110 Nausea: Secondary | ICD-10-CM

## 2021-06-26 DIAGNOSIS — S80262A Insect bite (nonvenomous), left knee, initial encounter: Secondary | ICD-10-CM | POA: Diagnosis not present

## 2021-06-26 NOTE — Progress Notes (Signed)
?  ? ?Subjective:  ?Patient ID: Tyler Burnett, male    DOB: [birth year], 66 y.o.   MRN: [id number] ? ?Patient Care Team: ?Claretta Fraise, MD as PCP - General (Family Medicine)  ? ?Chief Complaint:  Tick Removal ? ? ?HPI: ?Tyler Burnett is a 66 y.o. male presenting on 06/26/2021 for Tick Removal ? ? ?Pt presents today for evaluation after removing 3 ticks from him on Tuesday. States he was mowing on Monday and likely got bit then. He states Tuesday evening he ate a beef hotdog and vomited after. Yesterday morning he ate a sausage biscuit and vomited. No other associated symptoms. Has never had issues with red meat in the past.  ? ? ?Relevant past medical, surgical, family, and social history reviewed and updated as indicated.  ?Allergies and medications reviewed and updated. Data reviewed: Chart in Epic. ? ? ?Past Medical History:  ?Diagnosis Date  ? Diabetes mellitus without complication ([place])   ? Hypertension   ? ? ?Past Surgical History:  ?Procedure Laterality Date  ? arm surgery  1980  ? had broke in 9 places   ? FRACTURE SURGERY    ? ? ?Social History  ? ?Socioeconomic History  ? Marital status: Married  ?  Spouse name: Not on file  ? Number of children: Not on file  ? Years of education: Not on file  ? Highest education level: Not on file  ?Occupational History  ? Not on file  ?Tobacco Use  ? Smoking status: Never  ? Smokeless tobacco: Never  ?Vaping Use  ? Vaping Use: Never used  ?Substance and Sexual Activity  ? Alcohol use: No  ?  Comment: occasion 2 drinks per year  ? Drug use: No  ? Sexual activity: Yes  ?  Partners: Female  ?Other Topics Concern  ? Not on file  ?Social History Narrative  ? Not on file  ? ?Social Determinants of Health  ? ?Financial Resource Strain: Not on file  ?Food Insecurity: Not on file  ?Transportation Needs: Not on file  ?Physical Activity: Not on file  ?Stress: Not on file  ?Social Connections: Not on file  ?Intimate Partner Violence: Not on file  ? ? ?Outpatient Encounter Medications  as of 06/26/2021  ?Medication Sig  ? amLODipine (NORVASC) 10 MG tablet TAKE 1 TABLET BY MOUTH DAILY FOR BLOOD PRESSURE  ? atorvastatin (LIPITOR) 40 MG tablet TAKE 1 TABLET(40 MG) BY MOUTH DAILY  ? lisinopril (ZESTRIL) 10 MG tablet Take 1 tablet (10 mg total) by mouth daily.  ? metFORMIN (GLUCOPHAGE) 1000 MG tablet Take 1 tablet (1,000 mg total) by mouth 2 (two) times daily with a meal.  ? ?No facility-administered encounter medications on file as of 06/26/2021.  ? ? ?Allergies  ?Allergen Reactions  ? Doxycycline Other (See Comments)  ?  Abdominal pain  ? ? ?Review of Systems  ?Constitutional:  Negative for activity change, appetite change, chills, diaphoresis, fatigue, fever and unexpected weight change.  ?HENT: Negative.    ?Eyes: Negative.  Negative for photophobia and visual disturbance.  ?Respiratory:  Negative for cough, chest tightness and shortness of breath.   ?Cardiovascular:  Negative for chest pain, palpitations and leg swelling.  ?Gastrointestinal:  Positive for nausea and vomiting. Negative for abdominal distention, abdominal pain, anal bleeding, blood in stool, constipation, diarrhea and rectal pain.  ?Endocrine: Negative.   ?Genitourinary:  Negative for decreased urine volume, difficulty urinating, dysuria, frequency and urgency.  ?Musculoskeletal:  Negative for arthralgias, back pain, gait problem, joint swelling,  myalgias, neck pain and neck stiffness.  ?Skin: Negative.  Negative for color change and rash.  ?Allergic/Immunologic: Negative.   ?Neurological:  Negative for dizziness, tremors, seizures, syncope, facial asymmetry, speech difficulty, weakness, light-headedness, numbness and headaches.  ?Hematological: Negative.   ?Psychiatric/Behavioral:  Negative for confusion, hallucinations, sleep disturbance and suicidal ideas.   ?All other systems reviewed and are negative. ? ?   ? ?Objective:  ?BP 129/73   Pulse 62   Temp 98.9 ?F (37.2 ?C)   Ht 6' (1.829 m)   Wt 247 lb (112 kg)   SpO2 96%   BMI  33.50 kg/m?   ? ?Wt Readings from Last 3 Encounters:  ?06/26/21 247 lb (112 kg)  ?05/26/21 250 lb (113.4 kg)  ?02/14/21 247 lb (112 kg)  ? ? ?Physical Exam ?Vitals and nursing note reviewed.  ?Constitutional:   ?   General: He is not in acute distress. ?   Appearance: Normal appearance. He is obese. He is not ill-appearing, toxic-appearing or diaphoretic.  ?HENT:  ?   Head: Normocephalic and atraumatic.  ?   Mouth/Throat:  ?   Mouth: Mucous membranes are moist.  ?Eyes:  ?   Pupils: Pupils are equal, round, and reactive to light.  ?Cardiovascular:  ?   Rate and Rhythm: Normal rate and regular rhythm.  ?   Heart sounds: Normal heart sounds. No murmur heard. ?  No friction rub. No gallop.  ?Pulmonary:  ?   Effort: Pulmonary effort is normal.  ?   Breath sounds: Normal breath sounds.  ?Abdominal:  ?   General: Bowel sounds are normal. There is no distension.  ?   Palpations: Abdomen is soft. There is no mass.  ?   Tenderness: There is no abdominal tenderness. There is no right CVA tenderness, left CVA tenderness, guarding or rebound.  ?   Hernia: No hernia is present.  ?Musculoskeletal:  ?   Right lower leg: No edema.  ?   Left lower leg: No edema.  ?Skin: ?   General: Skin is warm and dry.  ?   Capillary Refill: Capillary refill takes less than 2 seconds.  ? ?    ?Neurological:  ?   General: No focal deficit present.  ?   Mental Status: He is alert and oriented to person, place, and time.  ?Psychiatric:     ?   Mood and Affect: Mood normal.     ?   Behavior: Behavior normal.     ?   Thought Content: Thought content normal.     ?   Judgment: Judgment normal.  ? ? ?Results for orders placed or performed in visit on 05/26/21  ?Bayer DCA Hb A1c Waived  ?Result Value Ref Range  ? HB A1C (BAYER DCA - WAIVED) 10.4 (H) 4.8 - 5.6 %  ?CBC with Differential/Platelet  ?Result Value Ref Range  ? WBC 5.7 3.4 - 10.8 x10E3/uL  ? RBC 5.22 4.14 - 5.80 x10E6/uL  ? Hemoglobin 15.7 13.0 - 17.7 g/dL  ? Hematocrit 45.1 37.5 - 51.0 %  ? MCV  86 79 - 97 fL  ? MCH 30.1 26.6 - 33.0 pg  ? MCHC 34.8 31.5 - 35.7 g/dL  ? RDW 12.5 11.6 - 15.4 %  ? Platelets 252 150 - 450 x10E3/uL  ? Neutrophils 44 Not Estab. %  ? Lymphs 45 Not Estab. %  ? Monocytes 7 Not Estab. %  ? Eos 3 Not Estab. %  ? Basos 1 Not Estab. %  ?  Neutrophils Absolute 2.5 1.4 - 7.0 x10E3/uL  ? Lymphocytes Absolute 2.5 0.7 - 3.1 x10E3/uL  ? Monocytes Absolute 0.4 0.1 - 0.9 x10E3/uL  ? EOS (ABSOLUTE) 0.2 0.0 - 0.4 x10E3/uL  ? Basophils Absolute 0.0 0.0 - 0.2 x10E3/uL  ? Immature Granulocytes 0 Not Estab. %  ? Immature Grans (Abs) 0.0 0.0 - 0.1 x10E3/uL  ?CMP14+EGFR  ?Result Value Ref Range  ? Glucose 218 (H) 70 - 99 mg/dL  ? BUN 10 8 - 27 mg/dL  ? Creatinine, Ser 0.85 0.76 - 1.27 mg/dL  ? eGFR 97 >59 mL/min/1.73  ? BUN/Creatinine Ratio 12 10 - 24  ? Sodium 136 134 - 144 mmol/L  ? Potassium 4.4 3.5 - 5.2 mmol/L  ? Chloride 98 96 - 106 mmol/L  ? CO2 25 20 - 29 mmol/L  ? Calcium 9.5 8.6 - 10.2 mg/dL  ? Total Protein 6.8 6.0 - 8.5 g/dL  ? Albumin 4.1 3.8 - 4.8 g/dL  ? Globulin, Total 2.7 1.5 - 4.5 g/dL  ? Albumin/Globulin Ratio 1.5 1.2 - 2.2  ? Bilirubin Total 0.3 0.0 - 1.2 mg/dL  ? Alkaline Phosphatase 83 44 - 121 IU/L  ? AST 14 0 - 40 IU/L  ? ALT 14 0 - 44 IU/L  ?Lipid panel  ?Result Value Ref Range  ? Cholesterol, Total 156 100 - 199 mg/dL  ? Triglycerides 169 (H) 0 - 149 mg/dL  ? HDL 48 >39 mg/dL  ? VLDL Cholesterol Cal 29 5 - 40 mg/dL  ? LDL Chol Calc (NIH) 79 0 - 99 mg/dL  ? Chol/HDL Ratio 3.3 0.0 - 5.0 ratio  ? ?   ? ?Pertinent labs & imaging results that were available during my care of the patient were reviewed by me and considered in my medical decision making. ? ?Assessment & Plan:  ?Tyler Burnett was seen today for tick removal. ? ?Diagnoses and all orders for this visit: ? ?Tick bite of right shoulder, initial encounter ?Tick bite of lower back, initial encounter ?Tick bite of left knee, initial encounter ?Nausea in adult ?Three tick bites on Monday. Now with nausea and vomiting. Will check below  for tick borne illnesses and treat if warranted. Pt aware to report any new, worsening, or persistent symptoms. ?-     Alpha-Gal Panel ?-     Rocky mtn spotted fvr abs pnl(IgG+IgM) ?-     Lyme Disease Serol

## 2021-06-30 LAB — ROCKY MTN SPOTTED FVR ABS PNL(IGG+IGM)
RMSF IgG: POSITIVE — AB
RMSF IgM: 0.28 index (ref 0.00–0.89)

## 2021-06-30 LAB — ALPHA-GAL PANEL
Allergen Lamb IgE: <0.1 kU/L
Beef IgE: <0.1 kU/L
IgE (Immunoglobulin E), Serum: 60 IU/mL (ref 6–495)
O215-IgE Alpha-Gal: 0.25 kU/L — AB
Pork IgE: <0.1 kU/L

## 2021-06-30 LAB — LYME DISEASE SEROLOGY W/REFLEX: Lyme Total Antibody EIA: NEGATIVE

## 2021-06-30 LAB — RMSF, IGG, IFA: RMSF, IGG, IFA: 1:64 {titer} — ABNORMAL HIGH

## 2021-09-03 ENCOUNTER — Ambulatory Visit: Payer: BC Managed Care – PPO | Admitting: Family Medicine

## 2021-10-13 ENCOUNTER — Ambulatory Visit: Payer: BC Managed Care – PPO | Admitting: Family Medicine

## 2021-10-13 ENCOUNTER — Encounter: Payer: Self-pay | Admitting: Family Medicine

## 2021-10-13 VITALS — BP 139/73 | HR 60 | Temp 97.4°F | Ht 72.0 in | Wt 250.2 lb

## 2021-10-13 DIAGNOSIS — I10 Essential (primary) hypertension: Secondary | ICD-10-CM | POA: Diagnosis not present

## 2021-10-13 DIAGNOSIS — E782 Mixed hyperlipidemia: Secondary | ICD-10-CM

## 2021-10-13 DIAGNOSIS — Z125 Encounter for screening for malignant neoplasm of prostate: Secondary | ICD-10-CM | POA: Diagnosis not present

## 2021-10-13 DIAGNOSIS — E119 Type 2 diabetes mellitus without complications: Secondary | ICD-10-CM

## 2021-10-13 LAB — BAYER DCA HB A1C WAIVED: HB A1C (BAYER DCA - WAIVED): 10.6 % — ABNORMAL HIGH (ref 4.8–5.6)

## 2021-10-13 MED ORDER — DAPAGLIFLOZIN PROPANEDIOL 10 MG PO TABS: 10.0000 mg | ORAL_TABLET | Freq: Every day | ORAL | 3 refills | Status: DC

## 2021-10-13 NOTE — Progress Notes (Signed)
Subjective:  Patient ID: Tyler Burnett,  male    DOB: [birth year]  Age: 66 y.o.    CC: Medical Management of Chronic Issues   HPI Horst Ritchie presents for  follow-up of hypertension. Patient has no history of headache chest pain or shortness of breath or recent cough. Patient also denies symptoms of TIA such as numbness weakness lateralizing. Patient denies side effects from medication. States taking it regularly.  Patient also  in for follow-up of elevated cholesterol. Doing well without complaints on current medication. Denies side effects  including myalgia and arthralgia and nausea. Also in today for liver function testing. Currently no chest pain, shortness of breath or other cardiovascular related symptoms noted.  Follow-up of diabetes. Patient does not check blood sugar at home. Trying to eat more healthy. Slips up sometimes. Admits he could eat more healthy. Eye appt. In 2 weeks.  Patient denies symptoms such as excessive hunger or urinary frequency, excessive hunger, nausea No significant hypoglycemic spells noted. Medications reviewed. Pt reports taking them regularly. Pt. denies complication/adverse reaction today.    History Leiby has a past medical history of Diabetes mellitus without complication ([place]) and Hypertension.   He has a past surgical history that includes Fracture surgery and arm surgery (1980).   His family history includes Aneurysm in his mother; Heart disease in his father.He reports that he has never smoked. He has never used smokeless tobacco. He reports that he does not drink alcohol and does not use drugs.  Current Outpatient Medications on File Prior to Visit  Medication Sig Dispense Refill   amLODipine (NORVASC) 10 MG tablet TAKE 1 TABLET BY MOUTH DAILY FOR BLOOD PRESSURE 90 tablet 3   atorvastatin (LIPITOR) 40 MG tablet TAKE 1 TABLET(40 MG) BY MOUTH DAILY 90 tablet 3   lisinopril (ZESTRIL) 10 MG tablet Take 1 tablet (10 mg total) by mouth daily.  90 tablet 3   metFORMIN (GLUCOPHAGE) 1000 MG tablet Take 1 tablet (1,000 mg total) by mouth 2 (two) times daily with a meal. 180 tablet 3   No current facility-administered medications on file prior to visit.    ROS Review of Systems  Objective:  BP 139/73   Pulse 60   Temp (!) 97.4 F (36.3 C)   Ht 6' (1.829 m)   Wt 250 lb 3.2 oz (113.5 kg)   SpO2 93%   BMI 33.93 kg/m   BP Readings from Last 3 Encounters:  10/13/21 139/73  06/26/21 129/73  05/26/21 136/73    Wt Readings from Last 3 Encounters:  10/13/21 250 lb 3.2 oz (113.5 kg)  06/26/21 247 lb (112 kg)  05/26/21 250 lb (113.4 kg)     Physical Exam  Diabetic Foot Exam - Simple   Simple Foot Form Diabetic Foot exam was performed with the following findings: Yes 10/13/2021  9:18 AM  Visual Inspection No deformities, no ulcerations, no other skin breakdown bilaterally: Yes Sensation Testing See comments: Yes Pulse Check Posterior Tibialis and Dorsalis pulse intact bilaterally: Yes Comments Decreased sensation at tips of great toes     Lab Results  Component Value Date   HGBA1C 10.4 (H) 05/26/2021   HGBA1C 8.3 (H) 06/13/2020   HGBA1C 7.4 (H) 08/10/2019    Assessment & Plan:   Aaryan was seen today for medical management of chronic issues.  Diagnoses and all orders for this visit:  Type 2 diabetes mellitus without complication, without long-term current use of insulin ([place]) -     Bayer DCA Hb  A1c Waived -     Microalbumin / creatinine urine ratio  Essential hypertension, benign -     CBC with Differential/Platelet -     CMP14+EGFR  Mixed hyperlipidemia -     Lipid panel  Prostate cancer screening -     PSA, total and free  Other orders -     dapagliflozin propanediol (FARXIGA) 10 MG TABS tablet; Take 1 tablet (10 mg total) by mouth daily before breakfast.   I am having Zalan Griffeth start on dapagliflozin propanediol. I am also having him maintain his amLODipine, atorvastatin, lisinopril, and  metFORMIN.  Meds ordered this encounter  Medications   dapagliflozin propanediol (FARXIGA) 10 MG TABS tablet    Sig: Take 1 tablet (10 mg total) by mouth daily before breakfast.    Dispense:  90 tablet    Refill:  3     Follow-up: Return in about 3 months (around 01/13/2022).  Claretta Fraise, M.D.

## 2021-10-13 NOTE — Patient Instructions (Signed)

## 2021-10-14 LAB — CMP14+EGFR
ALT: 13 IU/L (ref 0–44)
AST: 11 IU/L (ref 0–40)
Albumin/Globulin Ratio: 1.8 (ref 1.2–2.2)
Albumin: 4.4 g/dL (ref 3.9–4.9)
Alkaline Phosphatase: 90 IU/L (ref 44–121)
BUN/Creatinine Ratio: 13 (ref 10–24)
BUN: 12 mg/dL (ref 8–27)
Bilirubin Total: 0.6 mg/dL (ref 0.0–1.2)
CO2: 20 mmol/L (ref 20–29)
Calcium: 9.4 mg/dL (ref 8.6–10.2)
Chloride: 99 mmol/L (ref 96–106)
Creatinine, Ser: 0.9 mg/dL (ref 0.76–1.27)
Globulin, Total: 2.5 g/dL (ref 1.5–4.5)
Glucose: 286 mg/dL — ABNORMAL HIGH (ref 70–99)
Potassium: 4.8 mmol/L (ref 3.5–5.2)
Sodium: 140 mmol/L (ref 134–144)
Total Protein: 6.9 g/dL (ref 6.0–8.5)
eGFR: 95 mL/min/{1.73_m2} (ref >59)

## 2021-10-14 LAB — CBC WITH DIFFERENTIAL/PLATELET
Basophils Absolute: 0 10*3/uL (ref 0.0–0.2)
Basos: 0 %
EOS (ABSOLUTE): 0.2 10*3/uL (ref 0.0–0.4)
Eos: 3 %
Hematocrit: 45.6 % (ref 37.5–51.0)
Hemoglobin: 15.4 g/dL (ref 13.0–17.7)
Immature Grans (Abs): 0 10*3/uL (ref 0.0–0.1)
Immature Granulocytes: 0 %
Lymphocytes Absolute: 2.1 10*3/uL (ref 0.7–3.1)
Lymphs: 42 %
MCH: 29.5 pg (ref 26.6–33.0)
MCHC: 33.8 g/dL (ref 31.5–35.7)
MCV: 87 fL (ref 79–97)
Monocytes Absolute: 0.3 10*3/uL (ref 0.1–0.9)
Monocytes: 7 %
Neutrophils Absolute: 2.3 10*3/uL (ref 1.4–7.0)
Neutrophils: 48 %
Platelets: 248 10*3/uL (ref 150–450)
RBC: 5.22 x10E6/uL (ref 4.14–5.80)
RDW: 12.3 % (ref 11.6–15.4)
WBC: 4.9 10*3/uL (ref 3.4–10.8)

## 2021-10-14 LAB — LIPID PANEL
Chol/HDL Ratio: 2.9 ratio (ref 0.0–5.0)
Cholesterol, Total: 143 mg/dL (ref 100–199)
HDL: 49 mg/dL (ref >39)
LDL Chol Calc (NIH): 75 mg/dL (ref 0–99)
Triglycerides: 100 mg/dL (ref 0–149)
VLDL Cholesterol Cal: 19 mg/dL (ref 5–40)

## 2021-10-14 LAB — MICROALBUMIN / CREATININE URINE RATIO
Creatinine, Urine: 66.8 mg/dL
Microalb/Creat Ratio: 25 mg/g creat (ref 0–29)
Microalbumin, Urine: 16.5 ug/mL

## 2021-10-14 LAB — PSA, TOTAL AND FREE
PSA, Free Pct: 30 %
PSA, Free: 0.21 ng/mL
Prostate Specific Ag, Serum: 0.7 ng/mL (ref 0.0–4.0)

## 2022-01-20 ENCOUNTER — Encounter: Payer: Self-pay | Admitting: Family Medicine

## 2022-01-20 ENCOUNTER — Ambulatory Visit: Payer: BC Managed Care – PPO | Admitting: Family Medicine

## 2022-01-20 VITALS — BP 123/68 | HR 63 | Temp 97.0°F | Ht 72.0 in | Wt 242.0 lb

## 2022-01-20 DIAGNOSIS — Z23 Encounter for immunization: Secondary | ICD-10-CM | POA: Diagnosis not present

## 2022-01-20 DIAGNOSIS — I10 Essential (primary) hypertension: Secondary | ICD-10-CM

## 2022-01-20 DIAGNOSIS — E782 Mixed hyperlipidemia: Secondary | ICD-10-CM

## 2022-01-20 DIAGNOSIS — E119 Type 2 diabetes mellitus without complications: Secondary | ICD-10-CM

## 2022-01-20 LAB — BAYER DCA HB A1C WAIVED: HB A1C (BAYER DCA - WAIVED): 7.8 % — ABNORMAL HIGH (ref 4.8–5.6)

## 2022-01-20 MED ORDER — RYBELSUS 3 MG PO TABS: 3.0000 mg | ORAL_TABLET | Freq: Every morning | ORAL | 3 refills | Status: AC

## 2022-01-20 MED ORDER — TAMSULOSIN HCL 0.4 MG PO CAPS: 0.8000 mg | ORAL_CAPSULE | Freq: Every day | ORAL | 3 refills | Status: DC

## 2022-01-20 NOTE — Addendum Note (Signed)
Addended by: Adella Hare B on: 01/20/2022 04:44 PM   Modules accepted: Orders

## 2022-01-20 NOTE — Progress Notes (Signed)
Subjective:  Patient ID: Tyler Burnett, male    DOB: [birth year]  Age: 66 y.o. MRN: [id number]  CC: Medical Management of Chronic Issues   HPI Tyler Burnett presents forFollow-up of diabetes. Patient checks blood sugar at home.   117- 120s fasting and the same  postprandial Patient denies symptoms such as excessive hunger, nausea. He is drinking about 8,  1/2 liter water bottles a day. Having polyuria up 3-7 times a night No significant hypoglycemic spells noted. Medications reviewed. Pt reports taking them regularly without complication/adverse reaction being reported today.    presents for  follow-up of hypertension. Patient has no history of headache chest pain or shortness of breath or recent cough. Patient also denies symptoms of TIA such as focal numbness or weakness. Patient denies side effects from medication. States taking it regularly. BP shoots up sometimes at work due to stress. Retiring next June.     History Tyler Burnett has a past medical history of Diabetes mellitus without complication ([place]) and Hypertension.   He has a past surgical history that includes Fracture surgery and arm surgery (1980).   His family history includes Aneurysm in his mother; Heart disease in his father.He reports that he has never smoked. He has never used smokeless tobacco. He reports that he does not drink alcohol and does not use drugs.  Current Outpatient Medications on File Prior to Visit  Medication Sig Dispense Refill   amLODipine (NORVASC) 10 MG tablet TAKE 1 TABLET BY MOUTH DAILY FOR BLOOD PRESSURE 90 tablet 3   atorvastatin (LIPITOR) 40 MG tablet TAKE 1 TABLET(40 MG) BY MOUTH DAILY 90 tablet 3   dapagliflozin propanediol (FARXIGA) 10 MG TABS tablet Take 1 tablet (10 mg total) by mouth daily before breakfast. 90 tablet 3   lisinopril (ZESTRIL) 10 MG tablet Take 1 tablet (10 mg total) by mouth daily. 90 tablet 3   metFORMIN (GLUCOPHAGE) 1000 MG tablet Take 1 tablet (1,000 mg total) by mouth 2  (two) times daily with a meal. 180 tablet 3   No current facility-administered medications on file prior to visit.    ROS Review of Systems  Constitutional:  Negative for fever.  Respiratory:  Negative for shortness of breath.   Cardiovascular:  Negative for chest pain.  Genitourinary:  Positive for frequency (3-7 times a night).  Musculoskeletal:  Negative for arthralgias.  Skin:  Negative for rash.    Objective:  BP 123/68   Pulse 63   Temp (!) 97 F (36.1 C)   Ht 6' (1.829 m)   Wt 242 lb (109.8 kg)   SpO2 96%   BMI 32.82 kg/m   BP Readings from Last 3 Encounters:  01/20/22 123/68  10/13/21 139/73  06/26/21 129/73    Wt Readings from Last 3 Encounters:  01/20/22 242 lb (109.8 kg)  10/13/21 250 lb 3.2 oz (113.5 kg)  06/26/21 247 lb (112 kg)     Physical Exam Vitals reviewed.  Constitutional:      Appearance: He is well-developed.  HENT:     Head: Normocephalic and atraumatic.     Right Ear: External ear normal.     Left Ear: External ear normal.     Mouth/Throat:     Pharynx: No oropharyngeal exudate or posterior oropharyngeal erythema.  Eyes:     Pupils: Pupils are equal, round, and reactive to light.  Cardiovascular:     Rate and Rhythm: Normal rate and regular rhythm.     Heart sounds: No murmur heard. Pulmonary:  Effort: No respiratory distress.     Breath sounds: Normal breath sounds.  Musculoskeletal:     Cervical back: Normal range of motion and neck supple.  Neurological:     Mental Status: He is alert and oriented to person, place, and time.       Assessment & Plan:   Tyler Burnett was seen today for medical management of chronic issues.  Diagnoses and all orders for this visit:  Type 2 diabetes mellitus without complication, without long-term current use of insulin ([facility]) -     Bayer DCA Hb A1c Waived  Essential hypertension, benign -     CBC with Differential/Platelet -     CMP14+EGFR  Mixed hyperlipidemia -     Lipid  panel  Other orders -     tamsulosin (FLOMAX) 0.4 MG CAPS capsule; Take 2 capsules (0.8 mg total) by mouth at bedtime. For urine flow and prostate -     Semaglutide (RYBELSUS) 3 MG TABS; Take 3 mg by mouth in the morning.      I am having Tyler Burnett start on tamsulosin and Rybelsus. I am also having him maintain his amLODipine, atorvastatin, lisinopril, metFORMIN, and dapagliflozin propanediol.  Meds ordered this encounter  Medications   tamsulosin (FLOMAX) 0.4 MG CAPS capsule    Sig: Take 2 capsules (0.8 mg total) by mouth at bedtime. For urine flow and prostate    Dispense:  180 capsule    Refill:  3   Semaglutide (RYBELSUS) 3 MG TABS    Sig: Take 3 mg by mouth in the morning.    Dispense:  30 tablet    Refill:  3     Follow-up: Return in about 3 months (around 04/22/2022).  Claretta Fraise, M.D.

## 2022-01-21 LAB — CBC WITH DIFFERENTIAL/PLATELET
Basophils Absolute: 0 10*3/uL (ref 0.0–0.2)
Basos: 1 %
EOS (ABSOLUTE): 0.2 10*3/uL (ref 0.0–0.4)
Eos: 5 %
Hematocrit: 46.4 % (ref 37.5–51.0)
Hemoglobin: 15.1 g/dL (ref 13.0–17.7)
Immature Grans (Abs): 0 10*3/uL (ref 0.0–0.1)
Immature Granulocytes: 0 %
Lymphocytes Absolute: 2 10*3/uL (ref 0.7–3.1)
Lymphs: 41 %
MCH: 28.8 pg (ref 26.6–33.0)
MCHC: 32.5 g/dL (ref 31.5–35.7)
MCV: 88 fL (ref 79–97)
Monocytes Absolute: 0.3 10*3/uL (ref 0.1–0.9)
Monocytes: 7 %
Neutrophils Absolute: 2.2 10*3/uL (ref 1.4–7.0)
Neutrophils: 46 %
Platelets: 257 10*3/uL (ref 150–450)
RBC: 5.25 x10E6/uL (ref 4.14–5.80)
RDW: 12.8 % (ref 11.6–15.4)
WBC: 4.8 10*3/uL (ref 3.4–10.8)

## 2022-01-21 LAB — CMP14+EGFR
ALT: 11 IU/L (ref 0–44)
AST: 12 IU/L (ref 0–40)
Albumin/Globulin Ratio: 1.9 (ref 1.2–2.2)
Albumin: 4.4 g/dL (ref 3.9–4.9)
Alkaline Phosphatase: 68 IU/L (ref 44–121)
BUN/Creatinine Ratio: 11 (ref 10–24)
BUN: 11 mg/dL (ref 8–27)
Bilirubin Total: 0.6 mg/dL (ref 0.0–1.2)
CO2: 21 mmol/L (ref 20–29)
Calcium: 9.3 mg/dL (ref 8.6–10.2)
Chloride: 101 mmol/L (ref 96–106)
Creatinine, Ser: 0.99 mg/dL (ref 0.76–1.27)
Globulin, Total: 2.3 g/dL (ref 1.5–4.5)
Glucose: 249 mg/dL — ABNORMAL HIGH (ref 70–99)
Potassium: 4.8 mmol/L (ref 3.5–5.2)
Sodium: 140 mmol/L (ref 134–144)
Total Protein: 6.7 g/dL (ref 6.0–8.5)
eGFR: 85 mL/min/{1.73_m2} (ref >59)

## 2022-01-21 LAB — LIPID PANEL
Chol/HDL Ratio: 2.8 ratio (ref 0.0–5.0)
Cholesterol, Total: 132 mg/dL (ref 100–199)
HDL: 47 mg/dL
LDL Chol Calc (NIH): 71 mg/dL (ref 0–99)
Triglycerides: 66 mg/dL (ref 0–149)
VLDL Cholesterol Cal: 14 mg/dL (ref 5–40)

## 2022-01-26 NOTE — Progress Notes (Signed)
Hello Tyler Burnett,  Your lab result is normal and/or stable.Some minor variations that are not significant are commonly marked abnormal, but do not represent any medical problem for you.  Best regards, Javed Cotto, M.D.

## 2022-02-17 ENCOUNTER — Encounter: Payer: Self-pay | Admitting: Nurse Practitioner

## 2022-02-17 ENCOUNTER — Ambulatory Visit: Payer: BC Managed Care – PPO | Admitting: Nurse Practitioner

## 2022-02-17 VITALS — BP 134/78 | HR 78 | Temp 97.6°F | Resp 20 | Ht 72.0 in | Wt 239.0 lb

## 2022-02-17 DIAGNOSIS — J40 Bronchitis, not specified as acute or chronic: Secondary | ICD-10-CM

## 2022-02-17 MED ORDER — HYDROCODONE BIT-HOMATROP MBR 5-1.5 MG/5ML PO SOLN: 5.0000 mL | Freq: Four times a day (QID) | ORAL | 0 refills | Status: DC | PRN

## 2022-02-17 MED ORDER — AZITHROMYCIN 250 MG PO TABS: ORAL_TABLET | ORAL | 0 refills | Status: DC

## 2022-02-17 NOTE — Patient Instructions (Signed)
1. Take meds as prescribed 2. Use a cool mist humidifier especially during the winter months and when heat has been humid. 3. Use saline nose sprays frequently 4. Saline irrigations of the nose can be very helpful if done frequently.  * 4X daily for 1 week*  * Use of a nettie pot can be helpful with this. Follow directions with this* 5. Drink plenty of fluids 6. Keep thermostat turn down low 7.For any cough or congestion- hycodan 8. For fever or aces or pains- take tylenol or ibuprofen appropriate for age and weight.  * for fevers greater than 101 orally you may alternate ibuprofen and tylenol every  3 hours.    

## 2022-02-17 NOTE — Progress Notes (Signed)
Subjective:    Patient ID: Tyler Burnett, male    DOB: 21-Mar-1955, 66 y.o.   MRN: [id number]  Chief Complaint: congestion   URI  This is a new problem. The current episode started 1 to 4 weeks ago. The problem has been waxing and waning. There has been no fever. Associated symptoms include congestion, coughing and rhinorrhea. Pertinent negatives include no sinus pain or wheezing. Treatments tried: dayquil, nyquil and sudafed. The treatment provided mild relief.       Review of Systems  Constitutional:  Negative for chills and fever.  HENT:  Positive for congestion and rhinorrhea. Negative for sinus pain.   Respiratory:  Positive for cough. Negative for shortness of breath and wheezing.        Objective:   Physical Exam Vitals reviewed.  Constitutional:      Appearance: Normal appearance.  HENT:     Right Ear: Tympanic membrane normal. There is no impacted cerumen.     Left Ear: Tympanic membrane normal. There is no impacted cerumen.     Nose: Congestion and rhinorrhea present.     Mouth/Throat:     Pharynx: No oropharyngeal exudate or posterior oropharyngeal erythema.  Cardiovascular:     Rate and Rhythm: Normal rate and regular rhythm.     Heart sounds: Normal heart sounds.  Pulmonary:     Breath sounds: Normal breath sounds. No wheezing or rales.  Neurological:     General: No focal deficit present.     Mental Status: He is alert and oriented to person, place, and time.  Psychiatric:        Behavior: Behavior normal.    BP 134/78   Pulse 78   Temp 97.6 F (36.4 C) (Temporal)   Resp 20   Ht 6' (1.829 m)   Wt 239 lb (108.4 kg)   BMI 32.41 kg/m         Assessment & Plan:   BTDVVOH Looney in today with chief complaint of Cough and Nasal Congestion (For 2 weeks/)   1. Bronchitis 1. Take meds as prescribed 2. Use a cool mist humidifier especially during the winter months and when heat has been humid. 3. Use saline nose sprays frequently 4. Saline  irrigations of the nose can be very helpful if done frequently.  * 4X daily for 1 week*  * Use of a nettie pot can be helpful with this. Follow directions with this* 5. Drink plenty of fluids 6. Keep thermostat turn down low 7.For any cough or congestion- hycodan 8. For fever or aces or pains- take tylenol or ibuprofen appropriate for age and weight.  * for fevers greater than 101 orally you may alternate ibuprofen and tylenol every  3 hours.    Meds ordered this encounter  Medications   azithromycin (ZITHROMAX Z-PAK) 250 MG tablet    Sig: As directed    Dispense:  6 tablet    Refill:  0    Order Specific Question:   Supervising Provider    Answer:   Arville Care A [[id number]]   HYDROcodone bit-homatropine (HYCODAN) 5-1.5 MG/5ML syrup    Sig: Take 5 mLs by mouth every 6 (six) hours as needed for cough.    Dispense:  120 mL    Refill:  0    Order Specific Question:   Supervising Provider    Answer:   Arville Care A [[id number]]     The above assessment and management plan was discussed with the patient. The patient verbalized  understanding of and has agreed to the management plan. Patient is aware to call the clinic if symptoms persist or worsen. Patient is aware when to return to the clinic for a follow-up visit. Patient educated on when it is appropriate to go to the emergency department.   Galaxy-Margaret Hassell Done, FNP

## 2022-05-04 ENCOUNTER — Encounter: Payer: Self-pay | Admitting: Family Medicine

## 2022-05-04 ENCOUNTER — Ambulatory Visit (INDEPENDENT_AMBULATORY_CARE_PROVIDER_SITE_OTHER): Payer: BC Managed Care – PPO

## 2022-05-04 ENCOUNTER — Ambulatory Visit (INDEPENDENT_AMBULATORY_CARE_PROVIDER_SITE_OTHER): Payer: BC Managed Care – PPO | Admitting: Family Medicine

## 2022-05-04 VITALS — BP 117/64 | HR 60 | Temp 97.8°F | Ht 72.0 in | Wt 242.0 lb

## 2022-05-04 DIAGNOSIS — I10 Essential (primary) hypertension: Secondary | ICD-10-CM | POA: Diagnosis not present

## 2022-05-04 DIAGNOSIS — G4451 Hemicrania continua: Secondary | ICD-10-CM | POA: Diagnosis not present

## 2022-05-04 DIAGNOSIS — E782 Mixed hyperlipidemia: Secondary | ICD-10-CM | POA: Diagnosis not present

## 2022-05-04 DIAGNOSIS — E119 Type 2 diabetes mellitus without complications: Secondary | ICD-10-CM

## 2022-05-04 LAB — LIPID PANEL

## 2022-05-04 LAB — BAYER DCA HB A1C WAIVED: HB A1C (BAYER DCA - WAIVED): 8.7 % — ABNORMAL HIGH (ref 4.8–5.6)

## 2022-05-04 MED ORDER — BLOOD GLUCOSE MONITORING SUPPL DEVI: 1.0000 | Freq: Three times a day (TID) | 0 refills | Status: AC

## 2022-05-04 MED ORDER — AMLODIPINE BESYLATE 10 MG PO TABS: ORAL_TABLET | ORAL | 3 refills | Status: DC

## 2022-05-04 MED ORDER — METFORMIN HCL 1000 MG PO TABS: 1000.0000 mg | ORAL_TABLET | Freq: Two times a day (BID) | ORAL | 3 refills | Status: DC

## 2022-05-04 MED ORDER — LANCETS MISC. MISC: 1.0000 | Freq: Three times a day (TID) | 0 refills | Status: AC

## 2022-05-04 MED ORDER — CELECOXIB 200 MG PO CAPS: 200.0000 mg | ORAL_CAPSULE | Freq: Every day | ORAL | 3 refills | Status: DC

## 2022-05-04 MED ORDER — OZEMPIC (0.25 OR 0.5 MG/DOSE) 2 MG/3ML ~~LOC~~ SOPN: 0.2500 mg | PEN_INJECTOR | SUBCUTANEOUS | 1 refills | Status: DC

## 2022-05-04 MED ORDER — LISINOPRIL 10 MG PO TABS: 10.0000 mg | ORAL_TABLET | Freq: Every day | ORAL | 3 refills | Status: DC

## 2022-05-04 MED ORDER — BLOOD GLUCOSE TEST VI STRP: 1.0000 | ORAL_STRIP | Freq: Three times a day (TID) | 0 refills | Status: AC

## 2022-05-04 MED ORDER — ATORVASTATIN CALCIUM 40 MG PO TABS: ORAL_TABLET | ORAL | 3 refills | Status: DC

## 2022-05-04 MED ORDER — LANCET DEVICE MISC: 1.0000 | Freq: Three times a day (TID) | 0 refills | Status: AC

## 2022-05-04 NOTE — Progress Notes (Signed)
Subjective:  Patient ID: Tyler Burnett, male    DOB: [birth year]  Age: 67 y.o. MRN: [id number]  CC: Medical Management of Chronic Issues   HPI Tyler Burnett presents forFollow-up of diabetes. Patient not checking blood sugar at home.  Patient denies symptoms such as polyuria, polydipsia, excessive hunger, nausea No significant hypoglycemic spells noted. Medications reviewed. Pt reports taking them regularly without complication/adverse reaction being reported today.   Neck pain radiating to left parietal area all the way to the left frontal area. Onset one week ago. Nagging HA. 5-7/10. Pain is persistent, consant. Sometimes sharp. No vision changes. Hurts to turn his neck.  History Tyler Burnett has a past medical history of Diabetes mellitus without complication ([place]) and Hypertension.   He has a past surgical history that includes Fracture surgery and arm surgery (1980).   His family history includes Aneurysm in his mother; Heart disease in his father.He reports that he has never smoked. He has never used smokeless tobacco. He reports that he does not drink alcohol and does not use drugs.  Current Outpatient Medications on File Prior to Visit  Medication Sig Dispense Refill   dapagliflozin propanediol (FARXIGA) 10 MG TABS tablet Take 1 tablet (10 mg total) by mouth daily before breakfast. 90 tablet 3   tamsulosin (FLOMAX) 0.4 MG CAPS capsule Take 2 capsules (0.8 mg total) by mouth at bedtime. For urine flow and prostate 180 capsule 3   No current facility-administered medications on file prior to visit.    ROS Review of Systems  Constitutional:  Negative for fever.  Respiratory:  Negative for shortness of breath.   Cardiovascular:  Negative for chest pain.  Musculoskeletal:  Positive for neck pain. Negative for arthralgias.  Skin:  Negative for rash.  Neurological:  Positive for headaches.    Objective:  BP 117/64   Pulse 60   Temp 97.8 F (36.6 C)   Ht 6' (1.829 m)   Wt 242  lb (109.8 kg)   SpO2 96%   BMI 32.82 kg/m   BP Readings from Last 3 Encounters:  05/04/22 117/64  02/17/22 134/78  01/20/22 123/68    Wt Readings from Last 3 Encounters:  05/04/22 242 lb (109.8 kg)  02/17/22 239 lb (108.4 kg)  01/20/22 242 lb (109.8 kg)     Physical Exam Vitals reviewed.  Constitutional:      Appearance: He is well-developed.  HENT:     Head: Normocephalic and atraumatic.     Right Ear: External ear normal.     Left Ear: External ear normal.     Mouth/Throat:     Pharynx: No oropharyngeal exudate or posterior oropharyngeal erythema.  Eyes:     Pupils: Pupils are equal, round, and reactive to light.  Cardiovascular:     Rate and Rhythm: Normal rate and regular rhythm.     Heart sounds: No murmur heard. Pulmonary:     Effort: No respiratory distress.     Breath sounds: Normal breath sounds.  Musculoskeletal:        General: Tenderness (left posterior neck. Palpable spasm) present.     Cervical back: Normal range of motion and neck supple.  Neurological:     Mental Status: He is alert and oriented to person, place, and time.    C-spine: spondylosis noted. Decreased curvature  Assessment & Plan:   Tyler Burnett was seen today for medical management of chronic issues.  Diagnoses and all orders for this visit:  Type 2 diabetes mellitus without complication, without long-term  current use of insulin (HCC) -     Bayer DCA Hb A1c Waived -     metFORMIN (GLUCOPHAGE) 1000 MG tablet; Take 1 tablet (1,000 mg total) by mouth 2 (two) times daily with a meal. -     Semaglutide,0.25 or 0.'5MG'$ /DOS, (OZEMPIC, 0.25 OR 0.5 MG/DOSE,) 2 MG/3ML SOPN; Inject 0.25 mg into the skin once a week. -     Blood Glucose Monitoring Suppl DEVI; 1 each by Does not apply route in the morning, at noon, and at bedtime. May substitute to any manufacturer covered by patient's insurance. -     Glucose Blood (BLOOD GLUCOSE TEST STRIPS) STRP; 1 each by In Vitro route in the morning, at noon, and  at bedtime. May substitute to any manufacturer covered by patient's insurance. -     Lancet Device MISC; 1 each by Does not apply route in the morning, at noon, and at bedtime. May substitute to any manufacturer covered by patient's insurance. -     Lancets Misc. MISC; 1 each by Does not apply route in the morning, at noon, and at bedtime. May substitute to any manufacturer covered by patient's insurance.  Essential hypertension, benign -     CBC with Differential/Platelet -     CMP14+EGFR -     amLODipine (NORVASC) 10 MG tablet; TAKE 1 TABLET BY MOUTH DAILY FOR BLOOD PRESSURE -     lisinopril (ZESTRIL) 10 MG tablet; Take 1 tablet (10 mg total) by mouth daily.  Mixed hyperlipidemia -     Lipid panel -     atorvastatin (LIPITOR) 40 MG tablet; TAKE 1 TABLET(40 MG) BY MOUTH DAILY  Hemicrania continua -     DG Cervical Spine Complete; Future  Other orders -     celecoxib (CELEBREX) 200 MG capsule; Take 1 capsule (200 mg total) by mouth daily. With food      I have discontinued Tyler Burnett's azithromycin, HYDROcodone bit-homatropine, and Rybelsus. I am also having him start on Ozempic (0.25 or 0.5 MG/DOSE), Blood Glucose Monitoring Suppl, BLOOD GLUCOSE TEST STRIPS, Lancet Device, Lancets Misc., and celecoxib. Additionally, I am having him maintain his dapagliflozin propanediol, tamsulosin, amLODipine, atorvastatin, lisinopril, and metFORMIN.  Meds ordered this encounter  Medications   amLODipine (NORVASC) 10 MG tablet    Sig: TAKE 1 TABLET BY MOUTH DAILY FOR BLOOD PRESSURE    Dispense:  90 tablet    Refill:  3   atorvastatin (LIPITOR) 40 MG tablet    Sig: TAKE 1 TABLET(40 MG) BY MOUTH DAILY    Dispense:  90 tablet    Refill:  3   lisinopril (ZESTRIL) 10 MG tablet    Sig: Take 1 tablet (10 mg total) by mouth daily.    Dispense:  90 tablet    Refill:  3   metFORMIN (GLUCOPHAGE) 1000 MG tablet    Sig: Take 1 tablet (1,000 mg total) by mouth 2 (two) times daily with a meal.     Dispense:  180 tablet    Refill:  3   Semaglutide,0.25 or 0.'5MG'$ /DOS, (OZEMPIC, 0.25 OR 0.5 MG/DOSE,) 2 MG/3ML SOPN    Sig: Inject 0.25 mg into the skin once a week.    Dispense:  3 mL    Refill:  1   Blood Glucose Monitoring Suppl DEVI    Sig: 1 each by Does not apply route in the morning, at noon, and at bedtime. May substitute to any manufacturer covered by patient's insurance.    Dispense:  1 each  Refill:  0   Glucose Blood (BLOOD GLUCOSE TEST STRIPS) STRP    Sig: 1 each by In Vitro route in the morning, at noon, and at bedtime. May substitute to any manufacturer covered by patient's insurance.    Dispense:  100 strip    Refill:  0   Lancet Device MISC    Sig: 1 each by Does not apply route in the morning, at noon, and at bedtime. May substitute to any manufacturer covered by patient's insurance.    Dispense:  1 each    Refill:  0   Lancets Misc. MISC    Sig: 1 each by Does not apply route in the morning, at noon, and at bedtime. May substitute to any manufacturer covered by patient's insurance.    Dispense:  100 each    Refill:  0   celecoxib (CELEBREX) 200 MG capsule    Sig: Take 1 capsule (200 mg total) by mouth daily. With food    Dispense:  90 capsule    Refill:  3     Follow-up: Return in about 3 months (around 08/04/2022).  Claretta Fraise, M.D.

## 2022-05-05 LAB — CMP14+EGFR
ALT: 12 IU/L (ref 0–44)
AST: 10 IU/L (ref 0–40)
Albumin/Globulin Ratio: 1.6 (ref 1.2–2.2)
Albumin: 4.4 g/dL (ref 3.9–4.9)
Alkaline Phosphatase: 69 IU/L (ref 44–121)
BUN/Creatinine Ratio: 11 (ref 10–24)
BUN: 11 mg/dL (ref 8–27)
Bilirubin Total: 0.6 mg/dL (ref 0.0–1.2)
CO2: 22 mmol/L (ref 20–29)
Calcium: 9.3 mg/dL (ref 8.6–10.2)
Chloride: 98 mmol/L (ref 96–106)
Creatinine, Ser: 0.98 mg/dL (ref 0.76–1.27)
Globulin, Total: 2.7 g/dL (ref 1.5–4.5)
Glucose: 156 mg/dL — ABNORMAL HIGH (ref 70–99)
Potassium: 4.8 mmol/L (ref 3.5–5.2)
Sodium: 136 mmol/L (ref 134–144)
Total Protein: 7.1 g/dL (ref 6.0–8.5)
eGFR: 86 mL/min/{1.73_m2} (ref >59)

## 2022-05-05 LAB — CBC WITH DIFFERENTIAL/PLATELET
Basophils Absolute: 0 10*3/uL (ref 0.0–0.2)
Basos: 0 %
EOS (ABSOLUTE): 0.3 10*3/uL (ref 0.0–0.4)
Eos: 5 %
Hematocrit: 46.1 % (ref 37.5–51.0)
Hemoglobin: 15.8 g/dL (ref 13.0–17.7)
Immature Grans (Abs): 0 10*3/uL (ref 0.0–0.1)
Immature Granulocytes: 0 %
Lymphocytes Absolute: 2.5 10*3/uL (ref 0.7–3.1)
Lymphs: 44 %
MCH: 30.1 pg (ref 26.6–33.0)
MCHC: 34.3 g/dL (ref 31.5–35.7)
MCV: 88 fL (ref 79–97)
Monocytes Absolute: 0.4 10*3/uL (ref 0.1–0.9)
Monocytes: 7 %
Neutrophils Absolute: 2.4 10*3/uL (ref 1.4–7.0)
Neutrophils: 44 %
Platelets: 246 10*3/uL (ref 150–450)
RBC: 5.25 x10E6/uL (ref 4.14–5.80)
RDW: 12.3 % (ref 11.6–15.4)
WBC: 5.5 10*3/uL (ref 3.4–10.8)

## 2022-05-05 LAB — LIPID PANEL
Chol/HDL Ratio: 2.6 ratio (ref 0.0–5.0)
Cholesterol, Total: 136 mg/dL (ref 100–199)
HDL: 53 mg/dL (ref >39)
LDL Chol Calc (NIH): 68 mg/dL (ref 0–99)
Triglycerides: 77 mg/dL (ref 0–149)
VLDL Cholesterol Cal: 15 mg/dL (ref 5–40)

## 2022-05-05 NOTE — Progress Notes (Signed)
Hello [place],  Your lab result is normal and/or stable.Some minor variations that are not significant are commonly marked abnormal, but do not represent any medical problem for you.  Best regards, Claretta Fraise, M.D.

## 2022-06-05 ENCOUNTER — Encounter: Payer: Self-pay | Admitting: Nurse Practitioner

## 2022-06-05 ENCOUNTER — Ambulatory Visit: Payer: BC Managed Care – PPO | Admitting: Nurse Practitioner

## 2022-06-05 VITALS — BP 105/63 | HR 100 | Temp 98.0°F | Resp 20 | Ht 72.0 in | Wt 239.0 lb

## 2022-06-05 DIAGNOSIS — W57XXXA Bitten or stung by nonvenomous insect and other nonvenomous arthropods, initial encounter: Secondary | ICD-10-CM

## 2022-06-05 DIAGNOSIS — S30861A Insect bite (nonvenomous) of abdominal wall, initial encounter: Secondary | ICD-10-CM

## 2022-06-05 DIAGNOSIS — E119 Type 2 diabetes mellitus without complications: Secondary | ICD-10-CM

## 2022-06-05 MED ORDER — RYBELSUS 3 MG PO TABS: 3.0000 mg | ORAL_TABLET | Freq: Every day | ORAL | 0 refills | Status: DC

## 2022-06-05 MED ORDER — DOXYCYCLINE HYCLATE 100 MG PO TABS: 100.0000 mg | ORAL_TABLET | Freq: Two times a day (BID) | ORAL | 0 refills | Status: DC

## 2022-06-05 NOTE — Patient Instructions (Signed)
Tick Bite Information, Adult  Ticks are insects that can bite. Most ticks live in bushes and grassy areas. They climb onto people and animals that go by. Then they bite. Some ticks carry germs that can make you sick. How can I prevent tick bites? Take these steps: Before you go outside: Wear long sleeves and long pants. Wear light-colored clothes. Tuck your pant legs into your socks. Use an insect repellent that has 20% or higher of the ingredients DEET, picaridin, or IR3535. Follow the instructions on the label. Put it on: Bare skin. Avoid your eyes and mouth areas. The tops of your boots. Your pant legs. The ends of your sleeves. If you use an insect repellent that has the ingredient permethrin, follow the instructions on the label. Do not put permethrin on the skin. Put it on: Clothing. Shoes. Outdoor gear. Tents. When you are outside Avoid walking through long grass. Stay in the middle of the trail. Do not touch the bushes. Check for ticks on your clothes, hair, and skin often while you are outside. Check again before you go inside. When you go indoors Check your clothes for ticks. Dry your clothes in a dryer on high heat for 10 minutes or more. If clothes are damp, more time may be needed. Wash your clothes right away if they need to be washed. Use hot water. Check your pets and outdoor gear. Shower right away. Check your body for ticks. Do a full body check using a mirror. Check your clothes, skin, head, neck, armpits, waist, groin, and joint areas. What is the right way to remove a tick? Remove the tick from your skin as soon as possible. Do not remove the tick with your bare fingers. Do not try to remove a tick with heat, alcohol, petroleum jelly, or fingernail polish. To remove a tick that is crawling on your skin: Go outside and brush the tick off. Use tape or a lint roller. To remove a tick that is biting: Wash your hands. If you have gloves, put them on. Use tweezers,  curved forceps, or a tick-removal tool to grasp the tick. Grasp the tick as close to your skin and as close to the tick's head as possible. Gently pull up until the tick lets go. Try to keep the tick's head attached to its body. Do not twist or jerk the tick. Do not squeeze or crush the tick. What should I do after taking out a tick? Clean the bite area and your hands with soap and water, rubbing alcohol, or an iodine wash. If you have an antiseptic cream or ointment, put a small amount on the bite area. Wash and disinfect any instruments that you used to remove the tick. How should I get rid of a live tick? To get rid of a live tick, use one of these methods: Place the tick in rubbing alcohol. Place it in a bag or container you can close tightly. Throw it away. Wrap it tightly in tape. Throw it away. Flush it down the toilet. Where to find more information Centers for Disease Control and Prevention: cdc.gov/ticks U.S. Environmental Protection Agency: epa.gov/insect-repellents Contact a doctor if: You have a fever or chills. You have a red rash that makes a circle (bull's-eye rash) in the bite area. You have redness and swelling where the tick bit you. You have a headache or stiff neck. You have pain in a muscle, joint, or bone. You are more tired than normal. You have trouble walking or   moving your legs. You have numbness in your legs. You have tender or swollen lymph glands. You have belly (abdominal) pain, vomiting, watery poop (diarrhea), or weight loss. Get help right away if: You cannot remove a tick. You cannot move (have paralysis) or feel weak. You are feeling worse or have new symptoms. You find a tick that is biting you and filled with blood, especially if you are in an area where diseases from ticks are common. Summary Ticks may carry germs that can make you sick. To prevent tick bites wear long sleeves, long pants, and light colors. Use insect repellent. Follow the  instructions on the label. If the tick is biting, remove it right away. Do not try to remove it with heat, alcohol, petroleum jelly, or fingernail polish. Contact a doctor if you have symptoms of a disease after being bitten by a tick. This information is not intended to replace advice given to you by your health care provider. Make sure you discuss any questions you have with your health care provider. Document Revised: 05/19/2021 Document Reviewed: 05/19/2021 Elsevier Patient Education  2023 Elsevier Inc.  

## 2022-06-05 NOTE — Progress Notes (Signed)
Subjective:    Patient ID: Tyler Burnett, male    DOB: 10-Apr-1955, 67 y.o.   MRN: [id number]   Chief Complaint: Tick Removal   HPI  Patient comes in c/o tick bite. He removed 2 ticksoff of hisself on  Tuesday. Hard areas where removed.   Patient Active Problem List   Diagnosis Date Noted   Pharyngitis 01/31/2021   Precordial chest pain 09/27/2020   Morbid obesity due to excess calories 04/03/2016   Pain initiated by coughing 01/28/2016   Reflux esophagitis 01/20/2016   Herpes genitalis in men 01/28/2015   Hyperlipidemia 07/25/2014   Diabetes 09/05/2013   Essential hypertension, benign 09/05/2013    Patient was prescribed ozempic but it is on back ordered. Patient brought in rybelsis sample bottle that he says Dr. Darlyn Read gave him for diabetes. He is running out of it and needs refill.   Review of Systems  Constitutional:  Negative for fatigue and fever.       Objective:   Physical Exam Vitals and nursing note reviewed.  Constitutional:      Appearance: Normal appearance. He is well-developed.  Neck:     Thyroid: No thyroid mass or thyromegaly.     Vascular: No carotid bruit or JVD.     Trachea: Phonation normal.  Cardiovascular:     Rate and Rhythm: Normal rate and regular rhythm.  Pulmonary:     Effort: Pulmonary effort is normal. No respiratory distress.     Breath sounds: Normal breath sounds.  Abdominal:     General: Bowel sounds are normal.     Palpations: Abdomen is soft.     Tenderness: There is no abdominal tenderness.  Musculoskeletal:        General: Normal range of motion.     Cervical back: Normal range of motion and neck supple.  Lymphadenopathy:     Cervical: No cervical adenopathy.  Skin:    General: Skin is warm and dry.     Comments: 2cm raised areas where ticks were removed from right flank and left lower back  Neurological:     Mental Status: He is alert and oriented to person, place, and time.  Psychiatric:        Behavior: Behavior  normal.        Thought Content: Thought content normal.        Judgment: Judgment normal.     BP 105/63   Pulse 100   Temp 98 F (36.7 C) (Temporal)   Resp 20   Ht 6' (1.829 m)   Wt 239 lb (108.4 kg)   SpO2 99%   BMI 32.41 kg/m        Assessment & Plan:   Tyler Burnett in today with chief complaint of Tick Removal   1. Tick bite of abdomen, initial encounter Watch areas Chart says he wasallergic to doxycyclie- abdominalmpain- but patient says that abdominal pain was not from doxycycline and that he can take it.- med removed form allerg y list - doxycycline (VIBRA-TABS) 100 MG tablet; Take 1 tablet (100 mg total) by mouth 2 (two) times daily. 1 po bid  Dispense: 20 tablet; Refill: 0  2. Type 2 diabetes mellitus without complication, without long-term current use of insulin Patient given another sample of rybelsus until he can see Dr. Darlyn Read to see what he needs  to be on. - Semaglutide (RYBELSUS) 3 MG TABS; Take 1 tablet (3 mg total) by mouth daily.  Dispense: 30 tablet; Refill: 0    The  above assessment and management plan was discussed with the patient. The patient verbalized understanding of and has agreed to the management plan. Patient is aware to call the clinic if symptoms persist or worsen. Patient is aware when to return to the clinic for a follow-up visit. Patient educated on when it is appropriate to go to the emergency department.   Mary-Margaret Hassell Done, FNP

## 2022-06-15 ENCOUNTER — Ambulatory Visit: Payer: BC Managed Care – PPO | Admitting: Family Medicine

## 2022-07-08 ENCOUNTER — Ambulatory Visit (INDEPENDENT_AMBULATORY_CARE_PROVIDER_SITE_OTHER): Payer: Medicare Other | Admitting: Family Medicine

## 2022-07-08 ENCOUNTER — Encounter: Payer: Self-pay | Admitting: Family Medicine

## 2022-07-08 VITALS — BP 97/49 | HR 64 | Temp 97.0°F | Ht 72.0 in | Wt 239.4 lb

## 2022-07-08 DIAGNOSIS — E119 Type 2 diabetes mellitus without complications: Secondary | ICD-10-CM

## 2022-07-08 DIAGNOSIS — E782 Mixed hyperlipidemia: Secondary | ICD-10-CM | POA: Diagnosis not present

## 2022-07-08 DIAGNOSIS — Z7984 Long term (current) use of oral hypoglycemic drugs: Secondary | ICD-10-CM | POA: Diagnosis not present

## 2022-07-08 DIAGNOSIS — I10 Essential (primary) hypertension: Secondary | ICD-10-CM | POA: Diagnosis not present

## 2022-07-08 MED ORDER — RYBELSUS 3 MG PO TABS: 3.0000 mg | ORAL_TABLET | Freq: Every day | ORAL | 0 refills | Status: DC

## 2022-07-08 MED ORDER — CELECOXIB 200 MG PO CAPS: 200.0000 mg | ORAL_CAPSULE | Freq: Every day | ORAL | 5 refills | Status: DC

## 2022-07-08 NOTE — Progress Notes (Signed)
Subjective:  Patient ID: Tyler Burnett, male    DOB: 1955/04/14  Age: 67 y.o. MRN: [id number]  CC: Medical Management of Chronic Issues   HPI Tyler Burnett presents forFollow-up of diabetes. Patient checks blood sugar at home.   Doing pretty good.  Patient denies symptoms such as polyuria, polydipsia, excessive hunger, nausea No significant hypoglycemic spells noted. Medications reviewed. Pt reports taking them regularly without complication/adverse reaction being reported today.   Didn't get the arthritis med. (Problem with supply at the pharmacy.) Still having pain radiating from the left neck to the left temple.    in for follow-up of elevated cholesterol. Doing well without complaints on current medication. Denies side effects of statin including myalgia and arthralgia and nausea. Currently no chest pain, shortness of breath or other cardiovascular related symptoms noted.     presents for  follow-up of hypertension. Patient has no history of headache chest pain or shortness of breath or recent cough. Patient also denies symptoms of TIA such as focal numbness or weakness. Patient denies side effects from medication. States taking it regularly.   History Tyler Burnett has a past medical history of Diabetes mellitus without complication (HCC) and Hypertension.   Tyler Burnett has a past surgical history that includes Fracture surgery and arm surgery (1980).   His family history includes Aneurysm in his mother; Heart disease in his father.Tyler Burnett reports that Tyler Burnett has never smoked. Tyler Burnett has never used smokeless tobacco. Tyler Burnett reports that Tyler Burnett does not drink alcohol and does not use drugs.  Current Outpatient Medications on File Prior to Visit  Medication Sig Dispense Refill   amLODipine (NORVASC) 10 MG tablet TAKE 1 TABLET BY MOUTH DAILY FOR BLOOD PRESSURE 90 tablet 3   atorvastatin (LIPITOR) 40 MG tablet TAKE 1 TABLET(40 MG) BY MOUTH DAILY 90 tablet 3   Blood Glucose Monitoring Suppl DEVI 1 each by Does not  apply route in the morning, at noon, and at bedtime. May substitute to any manufacturer covered by patient's insurance. 1 each 0   dapagliflozin propanediol (FARXIGA) 10 MG TABS tablet Take 1 tablet (10 mg total) by mouth daily before breakfast. 90 tablet 3   lisinopril (ZESTRIL) 10 MG tablet Take 1 tablet (10 mg total) by mouth daily. 90 tablet 3   metFORMIN (GLUCOPHAGE) 1000 MG tablet Take 1 tablet (1,000 mg total) by mouth 2 (two) times daily with a meal. 180 tablet 3   tamsulosin (FLOMAX) 0.4 MG CAPS capsule Take 2 capsules (0.8 mg total) by mouth at bedtime. For urine flow and prostate 180 capsule 3   No current facility-administered medications on file prior to visit.    ROS Review of Systems  Constitutional:  Negative for fever.  Respiratory:  Negative for shortness of breath.   Cardiovascular:  Negative for chest pain.  Musculoskeletal:  Positive for arthralgias, myalgias, neck pain and neck stiffness.  Skin:  Negative for rash.    Objective:  BP (!) 97/49   Pulse 64   Temp (!) 97 F (36.1 C)   Ht 6' (1.829 m)   Wt 239 lb 6.4 oz (108.6 kg)   SpO2 95%   BMI 32.47 kg/m   BP Readings from Last 3 Encounters:  07/08/22 (!) 97/49  06/05/22 105/63  05/04/22 117/64    Wt Readings from Last 3 Encounters:  07/08/22 239 lb 6.4 oz (108.6 kg)  06/05/22 239 lb (108.4 kg)  05/04/22 242 lb (109.8 kg)     Physical Exam Vitals reviewed.  Constitutional:  Appearance: Tyler Burnett is well-developed.  HENT:     Head: Normocephalic and atraumatic.     Right Ear: External ear normal.     Left Ear: External ear normal.     Mouth/Throat:     Pharynx: No oropharyngeal exudate or posterior oropharyngeal erythema.  Eyes:     Pupils: Pupils are equal, round, and reactive to light.  Cardiovascular:     Rate and Rhythm: Normal rate and regular rhythm.     Heart sounds: No murmur heard. Pulmonary:     Effort: No respiratory distress.     Breath sounds: Normal breath sounds.   Musculoskeletal:        General: Tenderness (posterior neck) present.     Cervical back: Normal range of motion and neck supple.  Neurological:     Mental Status: Tyler Burnett is alert and oriented to person, place, and time.       Assessment & Plan:   Tyler Burnett was seen today for medical management of chronic issues.  Diagnoses and all orders for this visit:  Type 2 diabetes mellitus without complication, without long-term current use of insulin (HCC) -     Semaglutide (RYBELSUS) 3 MG TABS; Take 1 tablet (3 mg total) by mouth daily.  Other orders -     celecoxib (CELEBREX) 200 MG capsule; Take 1 capsule (200 mg total) by mouth daily. With food      I have discontinued Tyler Burnett's Ozempic (0.25 or 0.5 MG/DOSE), celecoxib, and doxycycline. I am also having him start on celecoxib. Additionally, I am having him maintain his dapagliflozin propanediol, tamsulosin, amLODipine, atorvastatin, lisinopril, metFORMIN, Blood Glucose Monitoring Suppl, and Rybelsus.  Meds ordered this encounter  Medications   celecoxib (CELEBREX) 200 MG capsule    Sig: Take 1 capsule (200 mg total) by mouth daily. With food    Dispense:  30 capsule    Refill:  5   Semaglutide (RYBELSUS) 3 MG TABS    Sig: Take 1 tablet (3 mg total) by mouth daily.    Dispense:  30 tablet    Refill:  0     Follow-up: Return in about 1 month (around 08/08/2022).  Mechele Claude, M.D.

## 2022-08-04 ENCOUNTER — Telehealth: Payer: Self-pay | Admitting: Family Medicine

## 2022-08-05 ENCOUNTER — Ambulatory Visit: Payer: Medicare Other

## 2022-09-21 ENCOUNTER — Ambulatory Visit (INDEPENDENT_AMBULATORY_CARE_PROVIDER_SITE_OTHER): Payer: Medicare Other | Admitting: Family Medicine

## 2022-09-21 ENCOUNTER — Encounter: Payer: Self-pay | Admitting: Family Medicine

## 2022-09-21 VITALS — BP 139/74 | HR 58 | Temp 97.4°F | Ht 72.0 in | Wt 247.8 lb

## 2022-09-21 DIAGNOSIS — R351 Nocturia: Secondary | ICD-10-CM

## 2022-09-21 DIAGNOSIS — N401 Enlarged prostate with lower urinary tract symptoms: Secondary | ICD-10-CM | POA: Insufficient documentation

## 2022-09-21 DIAGNOSIS — I10 Essential (primary) hypertension: Secondary | ICD-10-CM | POA: Diagnosis not present

## 2022-09-21 DIAGNOSIS — E782 Mixed hyperlipidemia: Secondary | ICD-10-CM

## 2022-09-21 DIAGNOSIS — E119 Type 2 diabetes mellitus without complications: Secondary | ICD-10-CM

## 2022-09-21 LAB — CBC WITH DIFFERENTIAL/PLATELET
Basos: 0 %
Eos: 6 %
Lymphocytes Absolute: 2.2 10*3/uL (ref 0.7–3.1)
Lymphs: 43 %
MCHC: 33.6 g/dL (ref 31.5–35.7)
Monocytes Absolute: 0.4 10*3/uL (ref 0.1–0.9)
Monocytes: 8 %
RBC: 5.13 x10E6/uL (ref 4.14–5.80)
RDW: 12.5 % (ref 11.6–15.4)

## 2022-09-21 LAB — CMP14+EGFR

## 2022-09-21 LAB — LIPID PANEL

## 2022-09-21 LAB — BAYER DCA HB A1C WAIVED: HB A1C (BAYER DCA - WAIVED): 8 % — ABNORMAL HIGH (ref 4.8–5.6)

## 2022-09-21 NOTE — Progress Notes (Signed)
Subjective:  Patient ID: Tyler Burnett, male    DOB: 01/27/1956  Age: 67 y.o. MRN: [id number]  CC: Medical Management of Chronic Issues   HPI Tyler Burnett presents for  follow-up of hypertension. Patient has no history of headache chest pain or shortness of breath or recent cough. Patient also denies symptoms of TIA such as focal numbness or weakness. Patient denies side effects from medication. States taking it regularly.  presents forFollow-up of diabetes. Patient not checking blood sugar at homent denies symptoms such as polyuria, polydipsia, excessive hunger, nausea.  No significant hypoglycemic spells noted. Medications reviewed.Unable to afford farxiga or rybelsus  Still up some at night to urinate, but sleeping okay overall. Taking tamsulosin regularly.  Lab Results  Component Value Date   HGBA1C 8.7 (H) 05/04/2022   HGBA1C 7.8 (H) 01/20/2022   HGBA1C 10.6 (H) 10/13/2021       History Tyler Burnett has a past medical history of Diabetes mellitus without complication (HCC) and Hypertension.   He has a past surgical history that includes Fracture surgery and arm surgery (1980).   His family history includes Aneurysm in his mother; Heart disease in his father.He reports that he has never smoked. He has never used smokeless tobacco. He reports that he does not drink alcohol and does not use drugs.  Current Outpatient Medications on File Prior to Visit  Medication Sig Dispense Refill   amLODipine (NORVASC) 10 MG tablet TAKE 1 TABLET BY MOUTH DAILY FOR BLOOD PRESSURE 90 tablet 3   atorvastatin (LIPITOR) 40 MG tablet TAKE 1 TABLET(40 MG) BY MOUTH DAILY 90 tablet 3   Blood Glucose Monitoring Suppl DEVI 1 each by Does not apply route in the morning, at noon, and at bedtime. May substitute to any manufacturer covered by patient's insurance. 1 each 0   celecoxib (CELEBREX) 200 MG capsule Take 1 capsule (200 mg total) by mouth daily. With food 30 capsule 5   dapagliflozin propanediol  (FARXIGA) 10 MG TABS tablet Take 1 tablet (10 mg total) by mouth daily before breakfast. 90 tablet 3   lisinopril (ZESTRIL) 10 MG tablet Take 1 tablet (10 mg total) by mouth daily. 90 tablet 3   metFORMIN (GLUCOPHAGE) 1000 MG tablet Take 1 tablet (1,000 mg total) by mouth 2 (two) times daily with a meal. 180 tablet 3   Semaglutide (RYBELSUS) 3 MG TABS Take 1 tablet (3 mg total) by mouth daily. 30 tablet 0   tamsulosin (FLOMAX) 0.4 MG CAPS capsule Take 2 capsules (0.8 mg total) by mouth at bedtime. For urine flow and prostate 180 capsule 3   No current facility-administered medications on file prior to visit.    ROS Review of Systems  Constitutional:  Negative for fever.  Respiratory:  Negative for shortness of breath.   Cardiovascular:  Negative for chest pain.  Genitourinary:  Positive for frequency.  Musculoskeletal:  Negative for arthralgias.  Skin:  Negative for rash.    Objective:  BP 139/74   Pulse (!) 58   Temp (!) 97.4 F (36.3 C)   Ht 6' (1.829 m)   Wt 247 lb 12.8 oz (112.4 kg)   SpO2 95%   BMI 33.61 kg/m   BP Readings from Last 3 Encounters:  09/21/22 139/74  07/08/22 (!) 97/49  06/05/22 105/63    Wt Readings from Last 3 Encounters:  09/21/22 247 lb 12.8 oz (112.4 kg)  07/08/22 239 lb 6.4 oz (108.6 kg)  06/05/22 239 lb (108.4 kg)     Physical Exam  Vitals reviewed.  Constitutional:      Appearance: He is well-developed.  HENT:     Head: Normocephalic and atraumatic.     Right Ear: External ear normal.     Left Ear: External ear normal.     Mouth/Throat:     Pharynx: No oropharyngeal exudate or posterior oropharyngeal erythema.  Eyes:     Pupils: Pupils are equal, round, and reactive to light.  Cardiovascular:     Rate and Rhythm: Normal rate and regular rhythm.     Heart sounds: No murmur heard. Pulmonary:     Effort: No respiratory distress.     Breath sounds: Normal breath sounds.  Musculoskeletal:     Cervical back: Normal range of motion and  neck supple.  Neurological:     Mental Status: He is alert and oriented to person, place, and time.       Assessment & Plan:   Tyler Burnett was seen today for medical management of chronic issues.  Diagnoses and all orders for this visit:  Type 2 diabetes mellitus without complication, without long-term current use of insulin (HCC) -     Bayer DCA Hb A1c Waived -     Microalbumin / creatinine urine ratio -     AMB Referral to Pharmacy Medication Management  Essential hypertension, benign -     CBC with Differential/Platelet -     CMP14+EGFR  Mixed hyperlipidemia -     Lipid panel  Benign prostatic hyperplasia with nocturia   Allergies as of 09/21/2022   No Active Allergies      Medication List        Accurate as of September 21, 2022  9:05 AM. If you have any questions, ask your nurse or doctor.          amLODipine 10 MG tablet Commonly known as: NORVASC TAKE 1 TABLET BY MOUTH DAILY FOR BLOOD PRESSURE   atorvastatin 40 MG tablet Commonly known as: LIPITOR TAKE 1 TABLET(40 MG) BY MOUTH DAILY   Blood Glucose Monitoring Suppl Devi 1 each by Does not apply route in the morning, at noon, and at bedtime. May substitute to any manufacturer covered by patient's insurance.   celecoxib 200 MG capsule Commonly known as: CeleBREX Take 1 capsule (200 mg total) by mouth daily. With food   dapagliflozin propanediol 10 MG Tabs tablet Commonly known as: Farxiga Take 1 tablet (10 mg total) by mouth daily before breakfast.   lisinopril 10 MG tablet Commonly known as: ZESTRIL Take 1 tablet (10 mg total) by mouth daily.   metFORMIN 1000 MG tablet Commonly known as: GLUCOPHAGE Take 1 tablet (1,000 mg total) by mouth 2 (two) times daily with a meal.   Rybelsus 3 MG Tabs Generic drug: Semaglutide Take 1 tablet (3 mg total) by mouth daily.   tamsulosin 0.4 MG Caps capsule Commonly known as: FLOMAX Take 2 capsules (0.8 mg total) by mouth at bedtime. For urine flow and  prostate          Discussed diet and exercise. Weight loss discussed.  Follow-up: Return in about 3 months (around 12/22/2022).  Mechele Claude, M.D.

## 2022-09-22 LAB — CMP14+EGFR
AST: 9 IU/L (ref 0–40)
BUN/Creatinine Ratio: 13 (ref 10–24)
Glucose: 248 mg/dL — ABNORMAL HIGH (ref 70–99)
Sodium: 138 mmol/L (ref 134–144)
Total Protein: 6.5 g/dL (ref 6.0–8.5)
eGFR: 96 mL/min/{1.73_m2} (ref >59)

## 2022-09-22 LAB — CBC WITH DIFFERENTIAL/PLATELET
Basophils Absolute: 0 10*3/uL (ref 0.0–0.2)
EOS (ABSOLUTE): 0.3 10*3/uL (ref 0.0–0.4)
Hematocrit: 45 % (ref 37.5–51.0)
Hemoglobin: 15.1 g/dL (ref 13.0–17.7)
Immature Grans (Abs): 0 10*3/uL (ref 0.0–0.1)
Immature Granulocytes: 0 %
MCH: 29.4 pg (ref 26.6–33.0)
MCV: 88 fL (ref 79–97)
Neutrophils Absolute: 2.3 10*3/uL (ref 1.4–7.0)
Neutrophils: 43 %
Platelets: 224 10*3/uL (ref 150–450)
WBC: 5.2 10*3/uL (ref 3.4–10.8)

## 2022-09-22 LAB — LIPID PANEL
Chol/HDL Ratio: 2.5 ratio (ref 0.0–5.0)
Cholesterol, Total: 134 mg/dL (ref 100–199)
LDL Chol Calc (NIH): 64 mg/dL (ref 0–99)
VLDL Cholesterol Cal: 16 mg/dL (ref 5–40)

## 2022-10-27 ENCOUNTER — Telehealth: Payer: Medicare Other

## 2022-10-27 ENCOUNTER — Ambulatory Visit (INDEPENDENT_AMBULATORY_CARE_PROVIDER_SITE_OTHER): Payer: Medicare Other | Admitting: Pharmacist

## 2022-10-27 DIAGNOSIS — Z7984 Long term (current) use of oral hypoglycemic drugs: Secondary | ICD-10-CM

## 2022-10-27 DIAGNOSIS — E119 Type 2 diabetes mellitus without complications: Secondary | ICD-10-CM

## 2022-10-27 NOTE — Patient Instructions (Addendum)
 Tips for Planning Health Meals 1. Fill half your plate with nonstarchy vegetables. Nonstarchy vegetables are lower in carbohydrate, so they do not raise blood sugar very much. They are also high in vitamins, minerals, and fiber, making them an important part of a healthy diet. Filling half your plate with nonstarchy vegetables means you will get plenty of servings of these superfoods.  Examples of nonstarchy vegetables: Asparagus Broccoli or Cauliflower Brussels Sprouts Cabbage (green, red, napa, bok choy, [place]) Carrots Celery Cucumber Eggplant Leafy greens such as kale, collards, mustard greens, or Swiss Chard Mushrooms Okra Green beans, pea pods, snow peas, and sugar snap peas Peppers such as bell peppers and hot peppers Salad greens such as lettuce, spinach, arugula, endive, and other salad mixes Squash such as zucchini, yellow squash, chayote, spaghetti squash Tomatoes  2. Fill one quarter of your plate with lean protein foods Foods high in protein such as fish, chicken, lean beef, soy products, and cheese are all considered "protein foods." Proteins foods (especially those from animal sources) usually contain saturated fat, which may increase your risk of heart disease. Lean proteins are lower in fat and saturated fat, making them a healthier choice.  Keep in mind that some plant-based protein foods (like beans and legumes) are also high in carbohydrates. Examples of lean protein foods include: Chicken, [place], and eggs Fish like salmon, cod, tuna, tilapia, or swordfish Shellfish like shrimp, scallops, clams, mussels, or lobster Lean beef cuts such as chuck, round, sirloin, flank, or tenderloin Lean pork cuts such as center loin chop or tenderloin Lean deli meats Cheese and cottage cheese Plant-based sources of protein: Beans, lentils, hummus, and falafel Nuts and nut butters Edamame Tofu and tempeh Plant-based meat substitutes  3. Fill one quarter of your plate with  carbohydrate foods  Foods that are higher in carbohydrate include grains, starchy vegetables, beans and legumes, fruit, yogurt, and milk. These foods have the greatest effect on blood sugar. Limiting your portion of carbohydrate foods to one quarter of your plate can help keep blood sugars from rising too high after meals.  Examples of carbohydrate foods: Whole grains such as brown rice, bulgur, oats/oatmeal, polenta, popcorn, quinoa, and whole grain products (bread, pasta, tortillas) Starchy vegetables such as acorn squash, butternut squash, green peas, parsnips, plantain, potato, pumpkin, and sweet potato/yam Beans and legumes such as black, kidney, pinto, and garbanzo beans Fruits and dried fruit Dairy products like milk, yogurt, and milk substitutes (i.e. soy milk) Looking for more inspiration? Check out this article for more tips and recipes for meal planning with the plate method.  4. Choose water or a low-calorie drink  Water is the best choice because it contains no calories or carbohydrates and has no effect on blood sugar. Other zero- or low-calorie drink options include: Unsweetened tea (hot or iced) Unsweetened coffee (hot or iced) Sparkling water/club soda Flavored water or sparkling water without added sugar Diet soda or other diet drinks  Source: American Diabetes Association

## 2022-10-27 NOTE — Progress Notes (Signed)
10/27/2022 Name: Tyler Burnett MRN: [id number] DOB: 1955/06/26  Chief Complaint  Patient presents with   Diabetes   Hypertension   Hyperlipidemia    Tyler Burnett is a 67 y.o. year old male who presented for a telephone visit. I connected with  Tyler Burnett on 10/27/22 by telephone and verified that I am speaking with the correct person using two identifiers.  I discussed the limitations of evaluation and management by telemedicine. The patient expressed understanding and agreed to proceed.  Patient was located in her home and PharmD in PCP office during this visit.    They were referred to the pharmacist by their PCP for assistance in managing diabetes and medication access.   Subjective:  Care Team: Primary Care Provider: Mechele Claude, MD ; Next Scheduled Visit: 11/16/22  Medication Access/Adherence  Current Pharmacy:  [phone] - [place], [place] - [address] [place] [address] Phone: [phone] Fax: [phone]  [id number] - [place], [place] - [address] AT [facility] [address] [place] [address] Phone: [phone] Fax: [phone]   Patient reports affordability concerns with their medications: Yes  - no prescription insurance.  Patient reports access/transportation concerns to their pharmacy: No  Patient reports adherence concerns with their medications:  Yes    Out of celecoxib, semaglutide (Rybelsus) - ran out two weeks, Farxiga (dapagliflozin)- ran out a month ago, atorvastatin - ran out a month ago.  Recently retired- was on his [employer]. He has medicare part A and B but does not have Medicare part D - he will plan to enroll this season.   Monthly household income ~$4000/month.  Wife is still working, but planning to retire this year.   Diabetes:  Current medications: metformin 1000 mg BID - paying cash- picked up about 1 week ago Medications tried in the  past: has not been able to afford semaglutide (Ozempic or Rybelsus) - has been getting samples of Rybelsus and [place].   Current glucose readings: not checking recently.   Patient denies hypoglycemic s/sx including shakiness, sweating. Does have lightheadeness when you Patient denies hyperglycemic symptoms including polyuria, polydipsia, polyphagia, nocturia, neuropathy, blurred vision.   Current meal patterns: eating 2 meals a day - tries to eat something "light"  Current physical activity: Recently started riding bike, walks with his grandson.   Current medication access support: none   Objective:  Lab Results  Component Value Date   HGBA1C 8.0 (H) 09/21/2022    Lab Results  Component Value Date   CREATININE 0.86 09/21/2022   BUN 11 09/21/2022   NA 138 09/21/2022   K 4.6 09/21/2022   CL 99 09/21/2022   CO2 26 09/21/2022    Lab Results  Component Value Date   CHOL 134 09/21/2022   HDL 54 09/21/2022   LDLCALC 64 09/21/2022   TRIG 81 09/21/2022   CHOLHDL 2.5 09/21/2022    Medications Reviewed Today     Reviewed by Particia Lather, RPH (Pharmacist) on 10/27/22 at 2011  Med List Status: <None>   Medication Order Taking? Sig Documenting Provider Last Dose Status Informant  amLODipine (NORVASC) 10 MG tablet [id number] Yes TAKE 1 TABLET BY MOUTH DAILY FOR BLOOD PRESSURE Stacks, Broadus John, MD Taking Active   atorvastatin (LIPITOR) 40 MG tablet [id number] No TAKE 1 TABLET(40 MG) BY MOUTH DAILY  Patient not taking: Reported on 10/27/2022   Tyler Claude, MD Not Taking Active  Blood Glucose Monitoring Suppl DEVI [id number]  1 each by Does not apply route in the morning, at noon, and at bedtime. May substitute to any manufacturer covered by patient's insurance. Tyler Claude, MD  Active   celecoxib (CELEBREX) 200 MG capsule [id number] No Take 1 capsule (200 mg total) by mouth daily. With food  Patient not taking: Reported on 10/27/2022   Tyler Claude, MD Not Taking Active    dapagliflozin propanediol (FARXIGA) 10 MG TABS tablet [id number]  Take 1 tablet (10 mg total) by mouth daily before breakfast. Tyler Claude, MD  Active   lisinopril (ZESTRIL) 10 MG tablet [id number] Yes Take 1 tablet (10 mg total) by mouth daily. Tyler Claude, MD Taking Active   metFORMIN (GLUCOPHAGE) 1000 MG tablet [id number] Yes Take 1 tablet (1,000 mg total) by mouth 2 (two) times daily with a meal. Tyler Claude, MD Taking Active   Semaglutide (RYBELSUS) 3 MG TABS [id number] Yes Take 1 tablet (3 mg total) by mouth daily. Tyler Claude, MD Taking Active            Med Note Geralyn Flash Oct 27, 2022  1:41 PM) [id number] EXP 09/30/2023  tamsulosin (FLOMAX) 0.4 MG CAPS capsule [id number]  Take 2 capsules (0.8 mg total) by mouth at bedtime. For urine flow and prostate Tyler Claude, MD  Active             Assessment/Plan:   Diabetes: - Currently uncontrolled with last A1c of 8.0% above goal <7% while intermittently taking Rybelsus (semaglutide) 3 mg and Farxiga (dapagliflozin) 10 mg via samples. Has been consistently taking metformin 1000 mg BID. Pt states that he prefers a daily pill (I.e. rybelsus) compared to once weekly injectable medication (Ozempic). Pt income likely within limit for Rybelsus and [place] patient assistance programs while he does not have prescription insurance. Will plan to provide patient with Rybelsus 3 mg sample (30ds) today and submit application for increased dose of 7 mg daily as pt denied AE.  - Reviewed long term cardiovascular and renal outcomes of uncontrolled blood sugar - Reviewed goal A1c, goal fasting, and goal 2 hour post prandial glucose - Reviewed dietary modifications including eating three meals a day emphasizing intake of protein and non-starchy vegetables and limiting intake of carbohydrate heavy foods - will provide patient with additional resources in MyChart for planning meals.  - Reviewed lifestyle modifications including: aiming for  150 minutes per week of moderate intensity exercise, staying hydrated with water throughout the day.  - Recommend to continue metformin 1000 mg BID - Recommend to continue Rybelsus 3 mg daily x 30 days then increase to 7 mg daily pending approval through patient assistance. Reviewed administration instructions and potential AE.  - Recommend to restart Marcelline Deist if approved for patient assistance. - Meets financial criteria for Rybelsus and [place] patient assistance program through [employer] and [place] [employer] [place] respectively. Will collaborate with provider, CPhT, and patient to pursue assistance.   Hyperlipidemia/ASCVD Risk Reduction: - Currently controlled with LDL-C of 64 mg/dL below goal <62 mg/dL on atorvastatin 40 mg daily, though patient has now been out of this medication for ~1 mo.  - Recommend to pick up atorvastatin 40 mg daily - will send patient MyChart message about lower cost options for paying cash price ([facility] $9 list)   Hypertension: - Currently uncontrolled per last office BP of 139/74 mmHg above goal <130/80 mmHg, though patient was likely not taking lisinopril 10 mg or amlodipine 10 mg at that time.  -  Recommend to restart amlodipine and lisinopril since these medications are likely affordable without insurance (will ask if patient would prefer to pick up medications from [facility] on $4 list).    Medication Management: - Shared information for [facility] counselor (phone number and office) in [facility] for support selecting Medicare Part D plan. Encouraged patient to reach out to this resource prior to open enrollment Oct 15 - Feb 06, 2023.   - Instructed pt that he does not need to continue taking/filling celecoxib if he is not having pain  Follow Up Plan: Will reach out to patient to schedule in person pharmacy appt after PCP appt with Stacks on 11/16/22.   30 min of patient care was provided to the patient during this visit time    Nils Pyle, PharmD PGY1 Pharmacy  Resident   Kieth Brightly, PharmD, BCACP Clinical Pharmacist, [facility]

## 2022-11-03 ENCOUNTER — Telehealth: Payer: Self-pay

## 2022-11-03 ENCOUNTER — Other Ambulatory Visit (HOSPITAL_COMMUNITY): Payer: Self-pay

## 2022-11-03 NOTE — Telephone Encounter (Signed)
-----   Message from Particia Lather sent at 10/27/2022  8:11 PM EDT ----- Jolene Schimke- I am working with Raynelle Fanning today. Could you please initiate patient assistance applications for Mr. Tyler Burnett for Rybelsus 7 mg PO daily ([employer]) and Farxiga 10 mg daily ([place]). He does not currently have prescription drug insurance and his estimated household income is in the note. Let me know if you have any questions/concerns. Thanks so much!  Nils Pyle, PharmD PGY1 Pharmacy Resident

## 2022-11-03 NOTE — Telephone Encounter (Signed)
Mailed [place] application to patients home.  Submitted application for RYBELSUS to [employer] for patient assistance via online portal.   Phone: [phone]

## 2022-11-10 NOTE — Telephone Encounter (Signed)
Received notification from [employer] regarding approval for OZEMPIC. Patient assistance approved from 11/10/22 to 11/25/23.  Medication will ship to office in 10-14 business days  Phone: [phone]  __________  Awaiting az&me application back from patient

## 2022-11-16 ENCOUNTER — Ambulatory Visit: Payer: Medicare Other | Admitting: Family Medicine

## 2022-11-16 ENCOUNTER — Encounter: Payer: Self-pay | Admitting: Family Medicine

## 2022-11-23 ENCOUNTER — Other Ambulatory Visit: Payer: Self-pay | Admitting: Family Medicine

## 2022-11-24 NOTE — Telephone Encounter (Signed)
Rybelsus has arrived for patient. Called patient, no answer, left message to call back. Medications up front for pick up.

## 2022-11-24 NOTE — Telephone Encounter (Signed)
Pt aware he will be here tomorrow morning to pick up

## 2022-11-25 NOTE — Telephone Encounter (Signed)
**  CORRECTION, [employer] APPROVAL FOR RYBELSUS   (Apologies!)  ___________  Rec'd patients completed AZ&ME application, Faxed PCP page to office.

## 2022-12-01 NOTE — Telephone Encounter (Signed)
Rec'd all pages for az&me application.  Submitted application for FARXIGA to [employer] for patient assistance.   Phone: [phone]

## 2022-12-04 NOTE — Telephone Encounter (Signed)
Received notification from [employer] regarding approval for [facility]. Patient assistance approved from 12/02/22 to 03/01/24.  Medication will ship to patients home  Pt ID: [id number]  Company phone: [phone]

## 2022-12-23 ENCOUNTER — Ambulatory Visit: Payer: Medicare Other | Admitting: Family Medicine

## 2023-01-19 ENCOUNTER — Encounter: Payer: Self-pay | Admitting: Family Medicine

## 2023-01-19 ENCOUNTER — Ambulatory Visit (INDEPENDENT_AMBULATORY_CARE_PROVIDER_SITE_OTHER): Payer: Medicare Other | Admitting: Family Medicine

## 2023-01-19 VITALS — BP 137/77 | HR 53 | Temp 97.3°F | Ht 72.0 in | Wt 254.4 lb

## 2023-01-19 DIAGNOSIS — E782 Mixed hyperlipidemia: Secondary | ICD-10-CM

## 2023-01-19 DIAGNOSIS — I10 Essential (primary) hypertension: Secondary | ICD-10-CM

## 2023-01-19 DIAGNOSIS — N401 Enlarged prostate with lower urinary tract symptoms: Secondary | ICD-10-CM

## 2023-01-19 DIAGNOSIS — E119 Type 2 diabetes mellitus without complications: Secondary | ICD-10-CM | POA: Diagnosis not present

## 2023-01-19 DIAGNOSIS — R351 Nocturia: Secondary | ICD-10-CM

## 2023-01-19 DIAGNOSIS — Z7984 Long term (current) use of oral hypoglycemic drugs: Secondary | ICD-10-CM

## 2023-01-19 DIAGNOSIS — Z1211 Encounter for screening for malignant neoplasm of colon: Secondary | ICD-10-CM

## 2023-01-19 LAB — BAYER DCA HB A1C WAIVED: HB A1C (BAYER DCA - WAIVED): 8.5 % — ABNORMAL HIGH (ref 4.8–5.6)

## 2023-01-19 MED ORDER — ATORVASTATIN CALCIUM 40 MG PO TABS
ORAL_TABLET | ORAL | 3 refills | Status: DC
Start: 2023-01-19 — End: 2023-01-20

## 2023-01-19 MED ORDER — TAMSULOSIN HCL 0.4 MG PO CAPS: 0.8000 mg | ORAL_CAPSULE | Freq: Every day | ORAL | 3 refills | Status: DC

## 2023-01-19 MED ORDER — RYBELSUS 7 MG PO TABS: 7.0000 mg | ORAL_TABLET | Freq: Every day | ORAL | 2 refills | Status: DC

## 2023-01-19 NOTE — Patient Instructions (Signed)

## 2023-01-19 NOTE — Progress Notes (Signed)
Subjective:  Patient ID: Tyler Burnett,  male    DOB: 1956/02/09  Age: 67 y.o.    CC: Medical Management of Chronic Issues   HPI Tyler Burnett presents for  follow-up of hypertension. Patient has no history of headache chest pain or shortness of breath or recent cough. Patient also denies symptoms of TIA such as numbness weakness lateralizing. Patient denies side effects from medication. States taking it regularly.  Patient also  in for follow-up of elevated cholesterol. Doing well without complaints on current medication. Denies side effects  including myalgia and arthralgia and nausea. Also in today for liver function testing. Currently no chest pain, shortness of breath or other cardiovascular related symptoms noted.  Follow-up of diabetes. Patient does not check blood sugar at home.  Patient denies symptoms such as excessive hunger or urinary frequency, excessive hunger, nausea No significant hypoglycemic spells noted. Medications reviewed. Pt reports taking them regularly. Pt. denies complication/adverse reaction today.    History Tyler Burnett has a past medical history of Diabetes mellitus without complication (HCC) and Hypertension.   Tyler Burnett has a past surgical history that includes Fracture surgery and arm surgery (1980).   His family history includes Aneurysm in his mother; Heart disease in his father.Tyler Burnett reports that Tyler Burnett has never smoked. Tyler Burnett has never used smokeless tobacco. Tyler Burnett reports that Tyler Burnett does not drink alcohol and does not use drugs.  Current Outpatient Medications on File Prior to Visit  Medication Sig Dispense Refill   amLODipine (NORVASC) 10 MG tablet TAKE 1 TABLET BY MOUTH DAILY FOR BLOOD PRESSURE 90 tablet 3   Blood Glucose Monitoring Suppl DEVI 1 each by Does not apply route in the morning, at noon, and at bedtime. May substitute to any manufacturer covered by patient's insurance. 1 each 0   celecoxib (CELEBREX) 200 MG capsule Take 1 capsule (200 mg total) by mouth daily.  With food 30 capsule 5   dapagliflozin propanediol (FARXIGA) 10 MG TABS tablet TAKE 1 TABLET BY MOUTH DAILY BEFORE BREAKFAST. 90 tablet 0   lisinopril (ZESTRIL) 10 MG tablet Take 1 tablet (10 mg total) by mouth daily. 90 tablet 3   metFORMIN (GLUCOPHAGE) 1000 MG tablet Take 1 tablet (1,000 mg total) by mouth 2 (two) times daily with a meal. 180 tablet 3   Semaglutide (RYBELSUS) 3 MG TABS Take 1 tablet (3 mg total) by mouth daily. (Patient not taking: Reported on 01/19/2023) 30 tablet 0   No current facility-administered medications on file prior to visit.    ROS Review of Systems  Constitutional:  Negative for fever.  Respiratory:  Negative for shortness of breath.   Cardiovascular:  Negative for chest pain.  Musculoskeletal:  Negative for arthralgias.  Skin:  Negative for rash.    Objective:  BP 137/77   Pulse (!) 53   Temp (!) 97.3 F (36.3 C)   Ht 6' (1.829 m)   Wt 254 lb 6.4 oz (115.4 kg)   SpO2 93%   BMI 34.50 kg/m   BP Readings from Last 3 Encounters:  01/19/23 137/77  09/21/22 139/74  07/08/22 (!) 97/49    Wt Readings from Last 3 Encounters:  01/19/23 254 lb 6.4 oz (115.4 kg)  09/21/22 247 lb 12.8 oz (112.4 kg)  07/08/22 239 lb 6.4 oz (108.6 kg)     Physical Exam Vitals reviewed.  Constitutional:      Appearance: Tyler Burnett is well-developed.  HENT:     Head: Normocephalic and atraumatic.     Right Ear: External ear  normal.     Left Ear: External ear normal.     Mouth/Throat:     Pharynx: No oropharyngeal exudate or posterior oropharyngeal erythema.  Eyes:     Pupils: Pupils are equal, round, and reactive to light.  Cardiovascular:     Rate and Rhythm: Normal rate and regular rhythm.     Heart sounds: No murmur heard. Pulmonary:     Effort: No respiratory distress.     Breath sounds: Normal breath sounds.  Musculoskeletal:     Cervical back: Normal range of motion and neck supple.  Neurological:     Mental Status: Tyler Burnett is alert and oriented to person,  place, and time.     Diabetic Foot Exam - Simple   No data filed     Lab Results  Component Value Date   HGBA1C 8.0 (H) 09/21/2022   HGBA1C 8.7 (H) 05/04/2022   HGBA1C 7.8 (H) 01/20/2022    Assessment & Plan:   Tyler Burnett seen today for medical management of chronic issues.  Diagnoses and all orders for this visit:  Essential hypertension, benign -     CBC with Differential/Platelet -     CMP14+EGFR  Mixed hyperlipidemia -     Lipid panel -     atorvastatin (LIPITOR) 40 MG tablet; TAKE 1 TABLET(40 MG) BY MOUTH DAILY  Type 2 diabetes mellitus without complication, without long-term current use of insulin (HCC) -     Bayer DCA Hb A1c Waived -     Microalbumin / creatinine urine ratio  Benign prostatic hyperplasia with nocturia -     Ambulatory referral to Urology  Screen for colon cancer -     Ambulatory referral to Gastroenterology  Other orders -     tamsulosin (FLOMAX) 0.4 MG CAPS capsule; Take 2 capsules (0.8 mg total) by mouth at bedtime. For urine flow and prostate -     Semaglutide (RYBELSUS) 7 MG TABS; Take 1 tablet (7 mg total) by mouth daily.   I am having Tyler Burnett start on Rybelsus. I am also having Tyler Burnett maintain his amLODipine, lisinopril, metFORMIN, Blood Glucose Monitoring Suppl, celecoxib, Rybelsus, Farxiga, tamsulosin, and atorvastatin.  Meds ordered this encounter  Medications   tamsulosin (FLOMAX) 0.4 MG CAPS capsule    Sig: Take 2 capsules (0.8 mg total) by mouth at bedtime. For urine flow and prostate    Dispense:  180 capsule    Refill:  3   atorvastatin (LIPITOR) 40 MG tablet    Sig: TAKE 1 TABLET(40 MG) BY MOUTH DAILY    Dispense:  90 tablet    Refill:  3   Semaglutide (RYBELSUS) 7 MG TABS    Sig: Take 1 tablet (7 mg total) by mouth daily.    Dispense:  30 tablet    Refill:  2     Follow-up: Return in about 3 months (around 04/21/2023).  Mechele Claude, M.D.

## 2023-01-20 ENCOUNTER — Other Ambulatory Visit: Payer: Self-pay | Admitting: Family Medicine

## 2023-01-20 DIAGNOSIS — E782 Mixed hyperlipidemia: Secondary | ICD-10-CM

## 2023-01-20 LAB — CMP14+EGFR
ALT: 11 [IU]/L (ref 0–44)
AST: 14 [IU]/L (ref 0–40)
Albumin: 4.3 g/dL (ref 3.9–4.9)
Alkaline Phosphatase: 67 [IU]/L (ref 44–121)
BUN/Creatinine Ratio: 19 (ref 10–24)
BUN: 20 mg/dL (ref 8–27)
Bilirubin Total: 0.4 mg/dL (ref 0.0–1.2)
CO2: 24 mmol/L (ref 20–29)
Calcium: 9.7 mg/dL (ref 8.6–10.2)
Chloride: 101 mmol/L (ref 96–106)
Creatinine, Ser: 1.04 mg/dL (ref 0.76–1.27)
Globulin, Total: 2.8 g/dL (ref 1.5–4.5)
Glucose: 114 mg/dL — ABNORMAL HIGH (ref 70–99)
Potassium: 4.5 mmol/L (ref 3.5–5.2)
Sodium: 139 mmol/L (ref 134–144)
Total Protein: 7.1 g/dL (ref 6.0–8.5)
eGFR: 79 mL/min/{1.73_m2} (ref >59)

## 2023-01-20 LAB — CBC WITH DIFFERENTIAL/PLATELET
Basophils Absolute: 0 10*3/uL (ref 0.0–0.2)
Basos: 0 %
EOS (ABSOLUTE): 0.2 10*3/uL (ref 0.0–0.4)
Eos: 3 %
Hematocrit: 49.6 % (ref 37.5–51.0)
Hemoglobin: 16.3 g/dL (ref 13.0–17.7)
Immature Grans (Abs): 0 10*3/uL (ref 0.0–0.1)
Immature Granulocytes: 0 %
Lymphocytes Absolute: 2.9 10*3/uL (ref 0.7–3.1)
Lymphs: 51 %
MCH: 29.4 pg (ref 26.6–33.0)
MCHC: 32.9 g/dL (ref 31.5–35.7)
MCV: 90 fL (ref 79–97)
Monocytes Absolute: 0.5 10*3/uL (ref 0.1–0.9)
Monocytes: 9 %
Neutrophils Absolute: 2.2 10*3/uL (ref 1.4–7.0)
Neutrophils: 37 %
Platelets: 248 10*3/uL (ref 150–450)
RBC: 5.54 x10E6/uL (ref 4.14–5.80)
RDW: 12.8 % (ref 11.6–15.4)
WBC: 5.8 10*3/uL (ref 3.4–10.8)

## 2023-01-20 LAB — MICROALBUMIN / CREATININE URINE RATIO
Creatinine, Urine: 98.3 mg/dL
Microalb/Creat Ratio: 25 mg/g{creat} (ref 0–29)
Microalbumin, Urine: 24.7 ug/mL

## 2023-01-20 LAB — LIPID PANEL
Chol/HDL Ratio: 4.4 ratio (ref 0.0–5.0)
Cholesterol, Total: 223 mg/dL — ABNORMAL HIGH (ref 100–199)
HDL: 51 mg/dL (ref >39)
LDL Chol Calc (NIH): 141 mg/dL — ABNORMAL HIGH (ref 0–99)
Triglycerides: 175 mg/dL — ABNORMAL HIGH (ref 0–149)
VLDL Cholesterol Cal: 31 mg/dL (ref 5–40)

## 2023-01-20 MED ORDER — ATORVASTATIN CALCIUM 80 MG PO TABS: 80.0000 mg | ORAL_TABLET | Freq: Every day | ORAL | 3 refills | Status: DC

## 2023-01-26 ENCOUNTER — Other Ambulatory Visit (HOSPITAL_COMMUNITY): Payer: Self-pay

## 2023-01-26 ENCOUNTER — Telehealth: Payer: Self-pay

## 2023-01-26 NOTE — Telephone Encounter (Signed)
Pharmacy Patient Advocate Encounter  Received notification from [facility] that Prior Authorization for Rybelsus 7MG  tablets  has been APPROVED from 01/12/23 to until further notice. Ran test claim, Copay is $11. This test claim was processed through [facility]- copay amounts may vary at other pharmacies due to pharmacy/plan contracts, or as the patient moves through the different stages of their insurance plan.   PA #/Case ID/Reference #:  [id number]

## 2023-01-26 NOTE — Telephone Encounter (Signed)
*  Primary  Pharmacy Patient Advocate Encounter   Received notification from CoverMyMeds that prior authorization for Rybelsus 7MG  tablets  is required/requested.   Insurance verification completed.   The patient is insured through [facility] .   Per test claim: PA required; PA submitted to above mentioned insurance via CoverMyMeds Key/confirmation #/EOC [id number] Status is pending

## 2023-01-26 NOTE — Telephone Encounter (Signed)
Left message advising medication approved and to call pharmacy to have filled and to call back with any further questions or concerns.

## 2023-02-12 ENCOUNTER — Encounter: Payer: Self-pay | Admitting: Pediatrics

## 2023-02-25 ENCOUNTER — Telehealth: Payer: Self-pay

## 2023-02-25 MED ORDER — RYBELSUS 7 MG PO TABS: 7.0000 mg | ORAL_TABLET | Freq: Every day | ORAL | 2 refills | Status: DC

## 2023-02-25 NOTE — Telephone Encounter (Signed)
Copied from CRM [id number]. Topic: Clinical - Medication Refill >> Feb 25, 2023  8:28 AM Jorje Guild R wrote: Most Recent Primary Care Visit:  Provider: Mechele Claude  Department: [facility]  Visit Type: OFFICE VISIT  Date: 01/19/2023  Medication: Semaglutide (RYBELSUS) 3 MG TABS  Has the patient contacted their pharmacy? Yes (Agent: If no, request that the patient contact the pharmacy for the refill. If patient does not wish to contact the pharmacy document the reason why and proceed with request.) (Agent: If yes, when and what did the pharmacy advise?)  Is this the correct pharmacy for this prescription? Yes If no, delete pharmacy and type the correct one.  This is the patient's preferred pharmacy:  [id number] - [place], [place] - [address] [place] [address] Phone: [phone] Fax: [phone]  [id number] - [place],  - [address] [place] [address] AT [facility] [address] [place] [address] [place] [address] Phone: [phone] Fax: [phone]   Has the prescription been filled recently? No  Is the patient out of the medication? Yes  Has the patient been seen for an appointment in the last year OR does the patient have an upcoming appointment? Yes  Can we respond through MyChart? Yes  Agent: Please be advised that Rx refills may take up to 3 business days. We ask that you follow-up with your pharmacy.

## 2023-02-25 NOTE — Addendum Note (Signed)
Addended by: Daisy Blossom on: 02/25/2023 01:49 PM   Modules accepted: Orders

## 2023-02-25 NOTE — Telephone Encounter (Signed)
 Refill sent Pt aware and verbalized understanding

## 2023-03-09 ENCOUNTER — Ambulatory Visit: Payer: Medicare Other

## 2023-03-09 ENCOUNTER — Telehealth: Payer: Self-pay | Admitting: Pharmacist

## 2023-03-09 ENCOUNTER — Other Ambulatory Visit (INDEPENDENT_AMBULATORY_CARE_PROVIDER_SITE_OTHER): Payer: Medicare Other | Admitting: Pharmacist

## 2023-03-09 VITALS — BP 150/78

## 2023-03-09 DIAGNOSIS — Z7984 Long term (current) use of oral hypoglycemic drugs: Secondary | ICD-10-CM

## 2023-03-09 DIAGNOSIS — E119 Type 2 diabetes mellitus without complications: Secondary | ICD-10-CM

## 2023-03-09 MED ORDER — DAPAGLIFLOZIN PROPANEDIOL 10 MG PO TABS
10.0000 mg | ORAL_TABLET | Freq: Every day | ORAL | 5 refills | Status: DC
Start: 2023-03-09 — End: 2023-09-23

## 2023-03-09 NOTE — Progress Notes (Signed)
 03/09/2023 Name: Tyler Burnett MRN: [id number] DOB: 1955-06-29  Chief Complaint  Patient presents with   Depression   Hyperlipidemia   Tyler Burnett is a 68 y.o. year old male who presented for a telephone visit. I connected with  Tyler Burnett on 03/09/23 by telephone and verified that I am speaking with the correct person using two identifiers.  I discussed the limitations of evaluation and management by telemedicine. The patient expressed understanding and agreed to proceed.  Patient was located in her home and PharmD in PCP office during this visit.   They were referred to the pharmacist by their PCP for assistance in managing diabetes and hyperlipidemia. Patient is also in need of medication assistance.  Subjective:  Care Team: Primary Care Provider: Zollie Lowers, MD ; Next Scheduled Visit: 04/05/23  Medication Access/Adherence  [facility] Pharmacy[facility]  [phone] [place] [place] - [address] [place] [address] Phone: [phone] Fax: [phone]  [id number] - [place], [place] - [address] [facility] [address] [place] [address] Phone: [phone] Fax: [phone]  [facility] - [place], [place] - [address] [place] [address] Phone: [phone] Fax: [phone]   Patient reports affordability concerns with their medications: Yes -medicare D Patient reports access/transportation concerns to their pharmacy: No  Patient reports adherence concerns with their medications:  Yes    Diabetes:  Current medications: rybelsus , metformin , farxiga  Medications tried in the past:   Patient denies hypoglycemic s/sx including shakiness, sweating. Does have lightheadeness when you Patient denies hyperglycemic symptoms including polyuria, polydipsia, polyphagia, nocturia, neuropathy, blurred vision.    Current meal patterns: past-->eating 2 meals a day - tries  to eat something light   Current physical activity: encouraged as able; past visit--Recently started riding bike, walks with his grandson.    Current medication access support: az&me PAP (farxiga ); will enroll for Novo PAP (rybelsus )  Hyperlipidemia/ASCVD Risk Reduction  Current lipid lowering medications: atorvastatin  80mg  (recently increased in 01/2023)--tolerating okay Medications tried in the past: n/a  Clinical ASCVD: No  The 10-year ASCVD risk score (Arnett DK, et al., 2019) is: 38.3%   Values used to calculate the score:     Age: 74 years     Sex: Male     Is Non-Hispanic African American: Yes     Diabetic: Yes     Tobacco smoker: No     Systolic Blood Pressure: 150 mmHg     Is BP treated: Yes     HDL Cholesterol: 51 mg/dL     Total Cholesterol: 223 mg/dL   Objective:  Lab Results  Component Value Date   HGBA1C 8.5 (H) 01/19/2023    Lab Results  Component Value Date   CREATININE 1.04 01/19/2023   BUN 20 01/19/2023   NA 139 01/19/2023   K 4.5 01/19/2023   CL 101 01/19/2023   CO2 24 01/19/2023    Lab Results  Component Value Date   CHOL 223 (H) 01/19/2023   HDL 51 01/19/2023   LDLCALC 141 (H) 01/19/2023   TRIG 175 (H) 01/19/2023   CHOLHDL 4.4 01/19/2023    Medications Reviewed Today     Reviewed by Tyler Burnett, [facility] (Pharmacist) on 03/09/23 at 1239  Med List Status: <None>   Medication Order Taking? Sig Documenting Provider Last Dose Status Informant  amLODipine  (NORVASC ) 10 MG tablet [id number] No  TAKE 1 TABLET BY MOUTH DAILY FOR BLOOD PRESSURE Tyler Burnett, Butler, MD Taking Active   atorvastatin  (LIPITOR) 80 MG tablet [id number]  Take 1 tablet (80 mg total) by mouth daily. Tyler Butler, MD  Active   Blood Glucose Monitoring Suppl DEVI [id number] No 1 each by Does not apply route in the morning, at noon, and at bedtime. May substitute to any manufacturer covered by patient's insurance. Tyler Butler, MD Taking Active   celecoxib  (CELEBREX ) 200 MG  capsule [id number] No Take 1 capsule (200 mg total) by mouth daily. With food Tyler Butler, MD Taking Active   dapagliflozin  propanediol (FARXIGA ) 10 MG TABS tablet [id number]  Take 1 tablet (10 mg total) by mouth daily before breakfast. Tyler Butler, MD  Active   lisinopril  (ZESTRIL ) 10 MG tablet [id number] No Take 1 tablet (10 mg total) by mouth daily. Tyler Butler, MD Taking Active   metFORMIN  (GLUCOPHAGE ) 1000 MG tablet [id number] No Take 1 tablet (1,000 mg total) by mouth 2 (two) times daily with a meal. Tyler Butler, MD Taking Active   Semaglutide  (RYBELSUS ) 7 MG TABS [id number]  Take 1 tablet (7 mg total) by mouth daily. Tyler Butler, MD  Active   tamsulosin  (FLOMAX ) 0.4 MG CAPS capsule [id number]  Take 2 capsules (0.8 mg total) by mouth at bedtime. For urine flow and prostate Tyler Butler, MD  Active              Assessment/Plan:   Diabetes: - Currently uncontrolled with last A1c increased to 8.5% above goal <7% while intermittently taking Rybelsus  (semaglutide ) 3 mg and Farxiga  (dapagliflozin ) 10 mg via samples. Has been consistently taking metformin  1000 mg daily.  Pt states that he prefers a daily pill (I.e. rybelsus ) compared to once weekly injectable medication (Ozempic ). Pt income likely within limit for Rybelsus  and Farxiga  patient assistance programs while he does not have prescription insurance.  - Reviewed long term cardiovascular and renal outcomes of uncontrolled blood sugar - Reviewed goal A1c, goal fasting, and goal 2 hour post prandial glucose - Reviewed dietary modifications including eating three meals a day emphasizing intake of protein and non-starchy vegetables and limiting intake of carbohydrate heavy foods - will provide patient with additional resources in MyChart for planning meals.  - Reviewed lifestyle modifications including: aiming for 150 minutes per week of moderate intensity exercise, staying hydrated with water throughout the day.  - Recommend to  INCREASE metformin  1000 mg BID - Recommend to continue Rybelsus  7mg --willl enroll in NOVO PAP    Reviewed administration instructions and potential AE.  - Recommend to continue Farxiga --will re-enroll in AZ&me PAP - Meets financial criteria for Rybelsus  and Farxiga  patient assistance program through [employer] and AZ & Me respectively. Will collaborate with provider, CPhT, and patient to pursue assistance.    Hyperlipidemia/ASCVD Risk Reduction: - Currently controlled with LDL-C of 64 mg/dL below goal <29 mg/dL on atorvastatin  40 mg daily, though patient has now been out of this medication for ~1 mo.  - Recommend to continue atorvastatin  80 mg daily    Hypertension: - Currently uncontrolled per last home reading BP 140-150s/70-80s mmHg above goal <130/80 mmHg - Recommend to restart amlodipine  and lisinopril  since these medications are likely affordable without insurance (will ask if patient would prefer to pick up medications from [facility] on $4 list).      15 min of patient care was provided to the patient during this visit time      Mliss Tarry Griffin, PharmD, BCACP, CPP Clinical Pharmacist, [facility]

## 2023-03-09 NOTE — Telephone Encounter (Signed)
 Please re-enroll/enroll for Farxiga & new start rybelsus 7mg   Patient okay with [place] electronically signing Thank you!

## 2023-03-16 NOTE — Telephone Encounter (Signed)
 Okay! I saw the farxiga but wasn't sure about the Rybelsus So good for rybelsus 2025? Thank you so much!!

## 2023-03-17 ENCOUNTER — Ambulatory Visit (AMBULATORY_SURGERY_CENTER): Payer: Medicare Other

## 2023-03-17 VITALS — Ht 72.0 in | Wt 230.0 lb

## 2023-03-17 DIAGNOSIS — Z1211 Encounter for screening for malignant neoplasm of colon: Secondary | ICD-10-CM

## 2023-03-17 MED ORDER — NA SULFATE-K SULFATE-MG SULF 17.5-3.13-1.6 GM/177ML PO SOLN: 1.0000 | Freq: Once | ORAL | 0 refills | Status: AC

## 2023-03-17 NOTE — Progress Notes (Signed)

## 2023-03-25 ENCOUNTER — Encounter: Payer: BC Managed Care – PPO | Admitting: Pediatrics

## 2023-04-05 ENCOUNTER — Encounter: Payer: Medicare Other | Admitting: Family Medicine

## 2023-04-13 ENCOUNTER — Ambulatory Visit (INDEPENDENT_AMBULATORY_CARE_PROVIDER_SITE_OTHER): Payer: Medicare Other | Admitting: Family Medicine

## 2023-04-13 ENCOUNTER — Ambulatory Visit (INDEPENDENT_AMBULATORY_CARE_PROVIDER_SITE_OTHER): Payer: Medicare Other

## 2023-04-13 VITALS — BP 138/78 | HR 67 | Temp 97.1°F | Ht 72.0 in | Wt 256.0 lb

## 2023-04-13 DIAGNOSIS — N401 Enlarged prostate with lower urinary tract symptoms: Secondary | ICD-10-CM

## 2023-04-13 DIAGNOSIS — R109 Unspecified abdominal pain: Secondary | ICD-10-CM

## 2023-04-13 DIAGNOSIS — Z0000 Encounter for general adult medical examination without abnormal findings: Secondary | ICD-10-CM

## 2023-04-13 DIAGNOSIS — Z7984 Long term (current) use of oral hypoglycemic drugs: Secondary | ICD-10-CM

## 2023-04-13 DIAGNOSIS — E119 Type 2 diabetes mellitus without complications: Secondary | ICD-10-CM | POA: Diagnosis not present

## 2023-04-13 DIAGNOSIS — Z0001 Encounter for general adult medical examination with abnormal findings: Secondary | ICD-10-CM

## 2023-04-13 DIAGNOSIS — R351 Nocturia: Secondary | ICD-10-CM

## 2023-04-13 DIAGNOSIS — I10 Essential (primary) hypertension: Secondary | ICD-10-CM

## 2023-04-13 LAB — URINALYSIS, COMPLETE
Bilirubin, UA: NEGATIVE
Ketones, UA: NEGATIVE
Leukocytes,UA: NEGATIVE
Nitrite, UA: NEGATIVE
Protein,UA: NEGATIVE
RBC, UA: NEGATIVE
Specific Gravity, UA: 1.015 (ref 1.005–1.030)
Urobilinogen, Ur: 0.2 mg/dL (ref 0.2–1.0)
pH, UA: 5 (ref 5.0–7.5)

## 2023-04-13 LAB — MICROSCOPIC EXAMINATION
Epithelial Cells (non renal): NONE SEEN /[HPF] (ref 0–10)
RBC, Urine: NONE SEEN /[HPF] (ref 0–2)
Renal Epithel, UA: NONE SEEN /[HPF]
WBC, UA: NONE SEEN /[HPF] (ref 0–5)
Yeast, UA: NONE SEEN

## 2023-04-13 MED ORDER — RYBELSUS 14 MG PO TABS: 14.0000 mg | ORAL_TABLET | Freq: Every day | ORAL | 3 refills | Status: DC

## 2023-04-13 MED ORDER — POLYETHYLENE GLYCOL 3350 17 GM/SCOOP PO POWD: 17.0000 g | Freq: Every day | ORAL | 1 refills | Status: AC

## 2023-04-13 NOTE — Addendum Note (Signed)
Addended by: Sindy Messing on: 04/13/2023 09:02 AM   Modules accepted: Orders

## 2023-04-13 NOTE — Progress Notes (Signed)
Welcome to Medicare Visit     Patient: Tyler Burnett, Male    DOB: 02/21/1956, 68 y.o.   MRN: [id number]  Subjective  Chief Complaint  Patient presents with   Medicare Wellness   care gaps    Colon// scheduled Foot// agrees Eye// scheduled   Flank Pain    Left side pain with a bowel movement. Feels like a knife in his side. Ongoing for two weeks. Only going 2 to 3 times a week.    Tyler Burnett is a 68 y.o. male who presents today for his Welcome to [facility] Visit. He reports consuming a  carb controlled  diet. Six glasses of water a day. Avoiding sodas, tea. Avoiding, hamburger, pizza, hot dogs. Eating more salad, carrots, celery  Not exercising as much as he should. Spends time with 52 y/o grandson in the yard. Walking to the mailbox.   He generally feels well. He reports sleeping well. He does have additional problems to discuss today. Left flank pain.   Flank Pain Pertinent negatives include no abdominal pain, chest pain, dysuria, fever, headaches, tingling, weakness or weight loss.    Vision:Within last year, Dental: No current dental problems, Receives regular dental care, and Last dental visit: 8 mos. Has appointment next month., and PSA: Agrees to PSA testing     Medications: Outpatient Medications Prior to Visit  Medication Sig   amLODipine (NORVASC) 10 MG tablet TAKE 1 TABLET BY MOUTH DAILY FOR BLOOD PRESSURE   atorvastatin (LIPITOR) 80 MG tablet Take 1 tablet (80 mg total) by mouth daily.   Blood Glucose Monitoring Suppl DEVI 1 each by Does not apply route in the morning, at noon, and at bedtime. May substitute to any manufacturer covered by patient's insurance.   celecoxib (CELEBREX) 200 MG capsule Take 1 capsule (200 mg total) by mouth daily. With food   dapagliflozin propanediol (FARXIGA) 10 MG TABS tablet Take 1 tablet (10 mg total) by mouth daily before breakfast.   lisinopril (ZESTRIL) 10 MG tablet Take 1 tablet (10 mg total) by mouth daily.    metFORMIN (GLUCOPHAGE) 1000 MG tablet Take 1 tablet (1,000 mg total) by mouth 2 (two) times daily with a meal.   tamsulosin (FLOMAX) 0.4 MG CAPS capsule Take 2 capsules (0.8 mg total) by mouth at bedtime. For urine flow and prostate   [DISCONTINUED] Semaglutide (RYBELSUS) 7 MG TABS Take 1 tablet (7 mg total) by mouth daily.   No facility-administered medications prior to visit.    No Known Allergies  Patient Care Team: Mechele Claude, MD as PCP - General (Family Medicine)  Review of Systems  Constitutional:  Negative for chills, diaphoresis, fever, malaise/fatigue and weight loss.  HENT:  Negative for congestion, ear pain, hearing loss, nosebleeds, sore throat and tinnitus.   Eyes:  Negative for blurred vision, double vision, photophobia, pain, discharge and redness.  Respiratory:  Negative for cough, hemoptysis, sputum production, shortness of breath and wheezing.   Cardiovascular:  Negative for chest pain, palpitations, orthopnea, leg swelling and PND.  Gastrointestinal:  Negative for abdominal pain, blood in stool, constipation (BM 2-3 times a week.), diarrhea, heartburn, melena, nausea and vomiting.  Genitourinary:  Positive for flank pain (left side after BMs. Can be severe. Lasts a couple of minutes. Feels like a knife twisting. Onset 2 weeks.) and frequency (up to pee 4 times a night.). Negative for dysuria, hematuria and urgency.  Musculoskeletal:  Negative for back pain, falls, myalgias and neck pain.  Skin:  Negative for itching  and rash.  Neurological:  Negative for dizziness, tingling, tremors, sensory change, speech change, focal weakness, seizures, loss of consciousness, weakness and headaches.  Endo/Heme/Allergies:  Negative for environmental allergies and polydipsia. Does not bruise/bleed easily.  Psychiatric/Behavioral:  Negative for depression, hallucinations, memory loss, substance abuse and suicidal ideas. The patient is not nervous/anxious and does not have insomnia.          Objective  BP 138/78   Pulse 67   Temp (!) 97.1 F (36.2 C)   Ht 6' (1.829 m)   Wt 256 lb (116.1 kg)   SpO2 97%   BMI 34.72 kg/m    Physical Exam    Most recent functional status assessment:    04/13/2023    7:57 AM  In your present state of health, do you have any difficulty performing the following activities:  Hearing? 1  Comment apt for hearing aids made  Vision? 1  Comment reading glasses  Difficulty concentrating or making decisions? 0  Walking or climbing stairs? 0  Dressing or bathing? 0  Doing errands, shopping? 0  Preparing Food and eating ? Y  Using the Toilet? Y  In the past six months, have you accidently leaked urine? N  Do you have problems with loss of bowel control? N  Managing your Medications? Y  Managing your Finances? Y  Housekeeping or managing your Housekeeping? Y   Most recent fall risk assessment:    04/13/2023    8:07 AM  Fall Risk   Falls in the past year? 0    Most recent depression screenings:    04/13/2023    8:04 AM 01/19/2023    3:24 PM  PHQ 2/9 Scores  PHQ - 2 Score 0 1  PHQ- 9 Score  2   Most recent cognitive screening:    04/13/2023    8:05 AM  6CIT Screen  What Year? 0 points  What month? 0 points  What time? 0 points  Count back from 20 0 points  Months in reverse 0 points  Repeat phrase 0 points  Total Score 0 points   Most recent Audit-C alcohol use screening     No data to display         A score of 3 or more in women, and 4 or more in men indicates increased risk for alcohol abuse, EXCEPT if all of the points are from question 1   Vision/Hearing Screen: No results found.     No results found for any visits on 04/13/23.    Assessment & Plan   Annual wellness visit done today including the all of the following: Reviewed patient's Family Medical History Reviewed and updated list of patient's medical providers Assessment of cognitive impairment was done Assessed patient's functional  ability Established a written schedule for health screening services Health Risk Assessent Completed and Reviewed  Exercise Activities and Dietary recommendations  Goals   None     Immunization History  Administered Date(s) Administered   Fluad Quad(high Dose 65+) 01/20/2022   Influenza,inj,Quad PF,6+ Mos 11/27/2015, 01/28/2018   Influenza-Unspecified 11/27/2015, 02/02/2018   Tdap 09/05/2013, 12/02/2020    Health Maintenance  Topic Date Due   Colonoscopy  09/06/2019   FOOT EXAM  10/14/2022   OPHTHALMOLOGY EXAM  12/16/2022   Zoster Vaccines- Shingrix (1 of 2) 04/21/2023 (Originally 12/15/2006)   COVID-19 Vaccine (1 - 2024-25 season) 04/29/2023 (Originally 11/01/2022)   INFLUENZA VACCINE  05/31/2023 (Originally 10/01/2022)   Pneumonia Vaccine 27+ Years old (1 of 2 -  PCV) 04/12/2024 (Originally 12/15/1962)   HEMOGLOBIN A1C  07/19/2023   Diabetic kidney evaluation - eGFR measurement  01/19/2024   Diabetic kidney evaluation - Urine ACR  01/19/2024   Medicare Annual Wellness (AWV)  04/12/2024   DTaP/Tdap/Td (2 - Td or Tdap) 12/03/2030   Hepatitis C Screening  Completed   HPV VACCINES  Aged Out     Discussed health benefits of physical activity, and encouraged him to engage in regular exercise appropriate for his age and condition.    Problem List Items Addressed This Visit       Active Problems   Benign prostatic hyperplasia with nocturia   Relevant Orders   PSA, total and free   Urinalysis, Complete   Urine Culture   CBC with Differential/Platelet   CMP14+EGFR   Diabetes (HCC)   Relevant Medications   Semaglutide (RYBELSUS) 14 MG TABS   Other Relevant Orders   CBC with Differential/Platelet   CMP14+EGFR   Essential hypertension, benign   Relevant Orders   CBC with Differential/Platelet   CMP14+EGFR   Other Visit Diagnoses       Welcome to Medicare preventive visit    -  Primary     Flank pain       Relevant Orders   Urinalysis, Complete   Urine Culture   CBC  with Differential/Platelet   CMP14+EGFR   DG Abd 2 Views       Return in about 1 month (around 05/11/2023) for diabetes, flank pain, frequent urination.     Mechele Claude, MD

## 2023-04-14 LAB — CMP14+EGFR
ALT: 16 [IU]/L (ref 0–44)
AST: 13 [IU]/L (ref 0–40)
Albumin: 4.5 g/dL (ref 3.9–4.9)
Alkaline Phosphatase: 72 [IU]/L (ref 44–121)
BUN/Creatinine Ratio: 16 (ref 10–24)
BUN: 16 mg/dL (ref 8–27)
Bilirubin Total: 0.6 mg/dL (ref 0.0–1.2)
CO2: 23 mmol/L (ref 20–29)
Calcium: 9.5 mg/dL (ref 8.6–10.2)
Chloride: 101 mmol/L (ref 96–106)
Creatinine, Ser: 1 mg/dL (ref 0.76–1.27)
Globulin, Total: 2.4 g/dL (ref 1.5–4.5)
Glucose: 145 mg/dL — ABNORMAL HIGH (ref 70–99)
Potassium: 4.9 mmol/L (ref 3.5–5.2)
Sodium: 140 mmol/L (ref 134–144)
Total Protein: 6.9 g/dL (ref 6.0–8.5)
eGFR: 83 mL/min/{1.73_m2} (ref >59)

## 2023-04-14 LAB — CBC WITH DIFFERENTIAL/PLATELET
Basophils Absolute: 0 10*3/uL (ref 0.0–0.2)
Basos: 0 %
EOS (ABSOLUTE): 0.2 10*3/uL (ref 0.0–0.4)
Eos: 4 %
Hematocrit: 50.5 % (ref 37.5–51.0)
Hemoglobin: 16.7 g/dL (ref 13.0–17.7)
Immature Grans (Abs): 0 10*3/uL (ref 0.0–0.1)
Immature Granulocytes: 0 %
Lymphocytes Absolute: 2.6 10*3/uL (ref 0.7–3.1)
Lymphs: 49 %
MCH: 29.5 pg (ref 26.6–33.0)
MCHC: 33.1 g/dL (ref 31.5–35.7)
MCV: 89 fL (ref 79–97)
Monocytes Absolute: 0.4 10*3/uL (ref 0.1–0.9)
Monocytes: 8 %
Neutrophils Absolute: 2.1 10*3/uL (ref 1.4–7.0)
Neutrophils: 39 %
Platelets: 216 10*3/uL (ref 150–450)
RBC: 5.66 x10E6/uL (ref 4.14–5.80)
RDW: 12.8 % (ref 11.6–15.4)
WBC: 5.3 10*3/uL (ref 3.4–10.8)

## 2023-04-14 LAB — PSA, TOTAL AND FREE
PSA, Free Pct: 32.5 %
PSA, Free: 0.26 ng/mL
Prostate Specific Ag, Serum: 0.8 ng/mL (ref 0.0–4.0)

## 2023-04-14 LAB — URINE CULTURE

## 2023-04-15 ENCOUNTER — Encounter: Payer: Self-pay | Admitting: Family Medicine

## 2023-04-15 NOTE — Progress Notes (Signed)
Hello Tyler Burnett,  Your lab result is normal and/or stable.Some minor variations that are not significant are commonly marked abnormal, but do not represent any medical problem for you.  Best regards, Mechele Claude, M.D.

## 2023-04-16 ENCOUNTER — Other Ambulatory Visit: Payer: Self-pay | Admitting: Family Medicine

## 2023-04-16 ENCOUNTER — Telehealth: Payer: Self-pay | Admitting: Family Medicine

## 2023-04-16 MED ORDER — PIOGLITAZONE HCL 30 MG PO TABS: 30.0000 mg | ORAL_TABLET | Freq: Every day | ORAL | 2 refills | Status: DC

## 2023-04-16 NOTE — Telephone Encounter (Signed)
Copied from CRM [id number]. Topic: Clinical - Prescription Issue >> Apr 16, 2023 11:06 AM Tyler Burnett wrote: Reason for CRM: He cannot afford the copay for Semaglutide (RYBELSUS) 14 MG TABS . Tyler Burnett wants know is there is another medication that he can use that is lower cost. Please call him at [phone]

## 2023-04-16 NOTE — Telephone Encounter (Signed)
Patient aware and verbalized understanding.

## 2023-04-16 NOTE — Telephone Encounter (Signed)
Please let the patient know that I sent their prescription to their pharmacy. Thanks, WS

## 2023-04-28 ENCOUNTER — Ambulatory Visit: Payer: Medicare Other | Admitting: Family Medicine

## 2023-04-29 ENCOUNTER — Encounter: Payer: BC Managed Care – PPO | Admitting: Pediatrics

## 2023-05-04 ENCOUNTER — Other Ambulatory Visit (INDEPENDENT_AMBULATORY_CARE_PROVIDER_SITE_OTHER): Payer: Medicare Other | Admitting: Pharmacist

## 2023-05-04 DIAGNOSIS — I10 Essential (primary) hypertension: Secondary | ICD-10-CM

## 2023-05-04 DIAGNOSIS — E119 Type 2 diabetes mellitus without complications: Secondary | ICD-10-CM

## 2023-05-04 DIAGNOSIS — E782 Mixed hyperlipidemia: Secondary | ICD-10-CM

## 2023-05-04 DIAGNOSIS — Z7984 Long term (current) use of oral hypoglycemic drugs: Secondary | ICD-10-CM

## 2023-05-04 MED ORDER — RYBELSUS 7 MG PO TABS: 7.0000 mg | ORAL_TABLET | Freq: Every day | ORAL | Status: DC

## 2023-05-04 NOTE — Progress Notes (Signed)
 05/04/2023 Name: Tyler Burnett MRN: [id number] DOB: [birth year]  Chief Complaint  Patient presents with   Diabetes   Hyperlipidemia    Tyler Burnett is a 68 y.o. year old male who presented for a telephone visit.  I connected with  Tyler Burnett on 05/04/23 by telephone and verified that I am speaking with the correct person using two identifiers. I discussed the limitations of evaluation and management by telemedicine. The patient expressed understanding and agreed to proceed.  Patient was located in her home and PharmD in PCP office during this visit.    They were referred to the pharmacist by their PCP for assistance in managing diabetes.   Subjective:  Care Team: Primary Care Provider: Mechele Claude, MD ; Next Scheduled Visit: 05/13/2023  Medication Access/Adherence  [facility]  [phone] [place] [place] - [address] [place] [address] Phone: [phone] Fax: [phone]  [id number] - [place], [place] - [address] [facility] [address] [place] [address] Phone: [phone] Fax: [phone]  [facility] - [place], [place] - [address] [place] [address] Phone: [phone] Fax: [phone]   Patient reports affordability concerns with their medications: No  Patient reports access/transportation concerns to their pharmacy: No  Patient reports adherence concerns with their medications:  No     Diabetes:  Current medications: metformin 1000 mg twice daily, Farxiga 10 mg daily, pioglitazone 30 mg daily Medications tried in the past: Rybelsus (recently ran out ~2.5 weeks ago and was unable to afford ~$500 copay, was approved for [employer] PAP and awaiting shipment)  Patient reports adherence to medications. Despite metformin last fill date in 2023 on epic fill hx, he confirmed he has 3 bottles of metformin 1000 mg and has been taking  this twice daily. States he uses a pill box to organize his medications and denies any missed doses. Received shipment of Farxiga from [facility] and is taking it. Pt's wife keeps a log of blood sugar readings, but he did not know where she keeps it. Says generally blood sugars have been ~100s, sometimes higher and he has noticed improvement since starting on Farxiga.  Had one episode of dizziness when he skipped a meal. Dizziness improved when he ate a meal. Patient denies hyperglycemic symptoms including polyuria, polydipsia, polyphagia, nocturia, neuropathy, blurred vision.  Current medication access support: AZ&Me PAP for Farxiga, Novo Nordisk PAP for Rybelsus  Hyperlipidemia/ASCVD Risk Reduction  Current lipid lowering medications: atorvastatin 80 mg daily  ASCVD History: none Risk Factors: T2DM, HLD, HTN  The 10-year ASCVD risk score (Arnett DK, et al., 2019) is: 33.8%   Values used to calculate the score:     Age: 89 years     Sex: Male     Is Non-Hispanic African American: Yes     Diabetic: Yes     Tobacco smoker: No     Systolic Blood Pressure: 138 mmHg     Is BP treated: Yes     HDL Cholesterol: 51 mg/dL     Total Cholesterol: 223 mg/dL  Hypertension:  Current medications: lisinopril 10 mg daily, amlodipine 10 mg daily  Patient confirmed adherence. Was able to confirm with amlodipine and lisinopril medication bottles at home.   Objective:  Lab Results  Component Value Date   HGBA1C 8.5 (H) 01/19/2023    Lab Results  Component Value  Date   CREATININE 1.00 04/13/2023   BUN 16 04/13/2023   NA 140 04/13/2023   K 4.9 04/13/2023   CL 101 04/13/2023   CO2 23 04/13/2023    Lab Results  Component Value Date   CHOL 223 (H) 01/19/2023   HDL 51 01/19/2023   LDLCALC 141 (H) 01/19/2023   TRIG 175 (H) 01/19/2023   CHOLHDL 4.4 01/19/2023    Medications Reviewed Today     Reviewed by Vela Prose, RPH (Pharmacist) on 05/04/23 at 0904  Med List Status: <None>    Medication Order Taking? Sig Documenting Provider Last Dose Status Informant  amLODipine (NORVASC) 10 MG tablet [id number] Yes TAKE 1 TABLET BY MOUTH DAILY FOR BLOOD PRESSURE Stacks, Broadus John, MD Taking Active   atorvastatin (LIPITOR) 80 MG tablet [id number] Yes Take 1 tablet (80 mg total) by mouth daily. Mechele Claude, MD Taking Active   Blood Glucose Monitoring Suppl DEVI [id number]  1 each by Does not apply route in the morning, at noon, and at bedtime. May substitute to any manufacturer covered by patient's insurance. Mechele Claude, MD  Active   celecoxib (CELEBREX) 200 MG capsule [id number] Yes Take 1 capsule (200 mg total) by mouth daily. With food Mechele Claude, MD Taking Active   dapagliflozin propanediol (FARXIGA) 10 MG TABS tablet [id number] Yes Take 1 tablet (10 mg total) by mouth daily before breakfast. Mechele Claude, MD Taking Active   lisinopril (ZESTRIL) 10 MG tablet [id number] Yes Take 1 tablet (10 mg total) by mouth daily. Mechele Claude, MD Taking Active   metFORMIN (GLUCOPHAGE) 1000 MG tablet [id number] Yes Take 1 tablet (1,000 mg total) by mouth 2 (two) times daily with a meal. Mechele Claude, MD Taking Active   pioglitazone (ACTOS) 30 MG tablet [id number] Yes Take 1 tablet (30 mg total) by mouth daily. Mechele Claude, MD Taking Active   polyethylene glycol powder (GLYCOLAX/MIRALAX) 17 GM/SCOOP powder [id number] Yes Take 17 g by mouth daily. Increase to twice daily if neded for consistent daily BM Mechele Claude, MD Taking Active   tamsulosin [facility]) 0.4 MG CAPS capsule [id number] Yes Take 2 capsules (0.8 mg total) by mouth at bedtime. For urine flow and prostate Mechele Claude, MD Taking Active               Assessment/Plan:   Diabetes: - Currently uncontrolled based on last A1c 8.5% on 01/19/2023 (up from 8.0% prior) and above goal <7%. Recently ran out Rybelsus while awaiting PAP shipment and was started on pioglitazone. Rybelsus 7 mg PAP shipment has arrived at our office  for him to pick up. He has been off of Rybelsus ~2.5 weeks, but previously tolerated 7 mg dose well with no side effects. No samples of 3 mg starter dose available and patient unable to afford copay at pharmacy. We will restart Rybelsus at 7 mg and stop pioglitazone. Patient plans to pick up today. Counseled on adverse effects and limiting portion sizes/stopping eating when satisfied to minimize GI adverse effects.  - Reviewed long term cardiovascular and renal outcomes of uncontrolled blood sugar - Reviewed goal A1c, goal fasting, and goal 2 hour post prandial glucose - Reviewed dietary modifications including emphasizing intake of protein and limiting intake of carb heavy foods - Recommend to restart Rybelsus 7 mg daily. Reviewed appropriate administration on an empty stomach first thing in the morning with water at least 30 minutes before other medications, food, or drink.  - Recommend to continue metformin 1000 mg BID - Recommend to continue Farxiga 10  mg daily - Patient denies personal or family history of multiple endocrine neoplasia type 2, medullary thyroid cancer; personal history of pancreatitis or gallbladder disease. - Recommend to check FBG daily - Approved for Novo Nordisk PAP for Rybelsus and AZ&Me PAP for Farxiga - A1c due at next PCP visit     Hypertension: - Currently uncontrolled but close to goal <130/80 based on last office visit BP 138/78 which is improved from previous. - Recommend to continue lisinopril 10 mg daily. Would consider increasing dose if BP elevated at upcoming PCP visit. - Recommend to continue amlodipine 10 mg daily     Hyperlipidemia/ASCVD Risk Reduction: - Currently uncontrolled based on last LDL 141 mg/dL, above goal <16 mg/dL given DM. Since then atorvastatin has been increased from 40 to 80 mg daily. Confirmed patient has medication on hand at home and has been placing in his pill box. - Reviewed long term complications of uncontrolled  cholesterol - Recommend to continue atorvastatin 80 mg daily. - Lipid panel due at next PCP visit    Follow Up Plan: PCP on 05/13/23 and PharmD on 06/08/2023  Jarrett Ables, PharmD PGY-1 Pharmacy Resident  Kieth Brightly, PharmD, BCACP, CPP Clinical Pharmacist, [facility]   30 min of patient care was provided to the patient during this visit time.  No charge visit

## 2023-05-13 ENCOUNTER — Ambulatory Visit: Payer: Medicare Other | Admitting: Family Medicine

## 2023-05-21 ENCOUNTER — Other Ambulatory Visit: Payer: Self-pay | Admitting: Family Medicine

## 2023-05-21 DIAGNOSIS — I10 Essential (primary) hypertension: Secondary | ICD-10-CM

## 2023-06-08 ENCOUNTER — Ambulatory Visit: Admitting: Family Medicine

## 2023-06-08 ENCOUNTER — Other Ambulatory Visit

## 2023-06-11 ENCOUNTER — Other Ambulatory Visit: Payer: Self-pay | Admitting: Family Medicine

## 2023-06-11 DIAGNOSIS — I10 Essential (primary) hypertension: Secondary | ICD-10-CM

## 2023-06-17 ENCOUNTER — Other Ambulatory Visit (INDEPENDENT_AMBULATORY_CARE_PROVIDER_SITE_OTHER)

## 2023-06-17 DIAGNOSIS — E119 Type 2 diabetes mellitus without complications: Secondary | ICD-10-CM

## 2023-06-17 DIAGNOSIS — Z7984 Long term (current) use of oral hypoglycemic drugs: Secondary | ICD-10-CM

## 2023-06-17 DIAGNOSIS — E782 Mixed hyperlipidemia: Secondary | ICD-10-CM

## 2023-06-17 NOTE — Progress Notes (Signed)
 06/17/2023 Name: Tyler Burnett MRN: [id number] DOB: [birth year]  Chief Complaint  Patient presents with   Diabetes    Tyler Burnett is a 68 y.o. year old male who presented for a telephone visit.   They were referred to the pharmacist by their PCP for assistance in managing diabetes, hypertension, and medication access.    Subjective:  Care Team: Primary Care Provider: Roise Cleaver, MD ;  Medication Access/Adherence  [facility]  [phone] [place], [place] - [address] [place] [address] Phone: [phone] Fax: [phone]  [id number] - [place], [place] - [address] [facility] [address] [place] [address] Phone: [phone] Fax: [phone]  [facility] - [place], [place] - [address] [place] [address] Phone: [phone] Fax: [phone]  Patient reports affordability concerns with their medications: Yes  Patient reports access/transportation concerns to their pharmacy: No  Patient reports adherence concerns with their medications:  Yes  only due to cost; patient very adherent when medications are available   Diabetes:  Current medications: metformin  1000 mg twice daily, Farxiga  10 mg daily, Rybelsus  7mg  daily Medications tried in the past: Rybelsus  (recently ran out ~2.5 weeks ago and was unable to afford ~$500 copay, was approved for Novo Nordisk PAP and awaiting shipment);  pioglitazone  30 mg daily   Patient reports adherence to medications. Despite metformin  last fill date in 2023 on epic fill hx, he confirmed he has 3 bottles of metformin  1000 mg and has been taking this twice daily. States he uses a pill box to organize his medications and denies any missed doses. Continues on Farxiga  via [facility]/[employer] PAP. Pt's wife keeps a log of blood sugar readings, but he did not know where she keeps it. Says generally blood sugars have  been ~100s, sometimes higher and he has noticed improvement since starting on Farxiga .   Had one episode of dizziness when he skipped a meal. Dizziness improved when he ate a meal. Patient denies hyperglycemic symptoms including polyuria, polydipsia, polyphagia, nocturia, neuropathy, blurred vision.   Current medication access support: AZ&Me PAP for Farxiga , Novo Nordisk PAP for Rybelsus      Objective:  Lab Results  Component Value Date   HGBA1C 7.5 (H) 06/30/2023    Lab Results  Component Value Date   CREATININE 1.08 06/30/2023   BUN 13 06/30/2023   NA 139 06/30/2023   K 4.5 06/30/2023   CL 101 06/30/2023   CO2 22 06/30/2023    Lab Results  Component Value Date   CHOL 123 06/30/2023   HDL 48 06/30/2023   LDLCALC 60 06/30/2023   TRIG 77 06/30/2023   CHOLHDL 2.6 06/30/2023    Medications Reviewed Today     Reviewed by Delilah Fend, [facility] (Pharmacist) on 06/17/23 at 0954  Med List Status: <None>   Medication Order Taking? Sig Documenting Provider Last Dose Status Informant  amLODipine  (NORVASC ) 10 MG tablet [id number]  TAKE 1 TABLET BY MOUTH EVERY DAY FOR BLOOD PRESSURE Roise Cleaver, MD  Active   atorvastatin  (LIPITOR) 80 MG tablet [id number] No Take 1 tablet (80 mg total) by mouth daily. Roise Cleaver, MD Taking Active   Blood Glucose Monitoring Suppl DEVI [id number] No 1 each by Does not apply route in the morning, at noon, and at bedtime. May substitute to any manufacturer covered by patient's insurance. Roise Cleaver, MD Taking  Active   celecoxib  (CELEBREX ) 200 MG capsule [id number] No Take 1 capsule (200 mg total) by mouth daily. With food Roise Cleaver, MD Taking Active   dapagliflozin  propanediol (FARXIGA ) 10 MG TABS tablet [id number] No Take 1 tablet (10 mg total) by mouth daily before breakfast. Roise Cleaver, MD Taking Active            Med Note Philmore Bream   Tue May 04, 2023 12:22 PM) Via AZ&Me PAP  lisinopril  (ZESTRIL ) 10 MG tablet [id number]  TAKE 1  TABLET BY MOUTH EVERY DAY Roise Cleaver, MD  Active   metFORMIN  (GLUCOPHAGE ) 1000 MG tablet [id number] No Take 1 tablet (1,000 mg total) by mouth 2 (two) times daily with a meal. Roise Cleaver, MD Taking Active   polyethylene glycol powder (GLYCOLAX /MIRALAX ) 17 GM/SCOOP powder [id number] No Take 17 g by mouth daily. Increase to twice daily if neded for consistent daily BM Roise Cleaver, MD Taking Active   Semaglutide  (RYBELSUS ) 7 MG TABS [id number]  Take 1 tablet (7 mg total) by mouth daily. Roise Cleaver, MD  Active            Med Note Holly Lush, KATIE R   Tue May 04, 2023 12:21 PM) Via Novo PAP  tamsulosin  (FLOMAX ) 0.4 MG CAPS capsule [id number] No Take 2 capsules (0.8 mg total) by mouth at bedtime. For urine flow and prostate Roise Cleaver, MD Taking Active              Assessment/Plan:   Diabetes: - Currently uncontrolled, A1c improving. Recently ran out Rybelsus  while awaiting PAP shipment and was started on pioglitazone . Rybelsus  7 mg PAP shipment has arrived at our office for him to pick up. He has been off of Rybelsus  ~2.5 weeks, but previously tolerated 7 mg dose well with no side effects. No samples of 3 mg starter dose available and patient unable to afford copay at pharmacy. We will restart Rybelsus  at 7 mg and stop pioglitazone . Patient plans to pick up today. Counseled on adverse effects and limiting portion sizes/stopping eating when satisfied to minimize GI adverse effects.  - Reviewed long term cardiovascular and renal outcomes of uncontrolled blood sugar - Reviewed goal A1c, goal fasting, and goal 2 hour post prandial glucose - Reviewed dietary modifications including emphasizing intake of protein and limiting intake of carb heavy foods - Restart Rybelsus  7 mg daily. Reviewed appropriate administration on an empty stomach first thing in the morning with water at least 30 minutes before other medications, food, or drink.  - Recommend to continue metformin  1000 mg BID -  Recommend to continue Farxiga  10 mg daily - Patient denies personal or family history of multiple endocrine neoplasia type 2, medullary thyroid cancer; personal history of pancreatitis or gallbladder disease. - Recommend to check FBG daily - Approved for Novo Nordisk PAP for Rybelsus  and AZ&Me PAP for Farxiga  - A1c due at next PCP visit    Follow Up Plan: PCP visit 06/30/23  Marvell Slider, PharmD, BCACP, CPP Clinical Pharmacist, [facility]

## 2023-06-28 ENCOUNTER — Ambulatory Visit: Admitting: Family Medicine

## 2023-06-30 ENCOUNTER — Encounter: Payer: Self-pay | Admitting: Family Medicine

## 2023-06-30 ENCOUNTER — Ambulatory Visit (INDEPENDENT_AMBULATORY_CARE_PROVIDER_SITE_OTHER): Admitting: Family Medicine

## 2023-06-30 VITALS — BP 109/60 | HR 62 | Temp 98.1°F | Ht 72.0 in | Wt 254.0 lb

## 2023-06-30 DIAGNOSIS — Z1211 Encounter for screening for malignant neoplasm of colon: Secondary | ICD-10-CM | POA: Diagnosis not present

## 2023-06-30 DIAGNOSIS — Z7984 Long term (current) use of oral hypoglycemic drugs: Secondary | ICD-10-CM

## 2023-06-30 DIAGNOSIS — I10 Essential (primary) hypertension: Secondary | ICD-10-CM | POA: Diagnosis not present

## 2023-06-30 DIAGNOSIS — E119 Type 2 diabetes mellitus without complications: Secondary | ICD-10-CM | POA: Diagnosis not present

## 2023-06-30 DIAGNOSIS — E782 Mixed hyperlipidemia: Secondary | ICD-10-CM | POA: Diagnosis not present

## 2023-06-30 LAB — BAYER DCA HB A1C WAIVED: HB A1C (BAYER DCA - WAIVED): 7.5 % — ABNORMAL HIGH (ref 4.8–5.6)

## 2023-06-30 MED ORDER — RYBELSUS 14 MG PO TABS: 14.0000 mg | ORAL_TABLET | Freq: Every day | ORAL | 1 refills | Status: DC

## 2023-06-30 NOTE — Progress Notes (Signed)
 Subjective:  Patient ID: Tyler Burnett,  male    DOB: [birth year]  Age: 68 y.o.    CC: Diabetes and Tinnitus (Ringing in both ears but more right ear. One month. Especially around machinery. )   HPI Tyler Burnett presents for  follow-up of hypertension. Patient has no history of headache chest pain or shortness of breath or recent cough. Patient also denies symptoms of TIA such as numbness weakness lateralizing. Patient denies side effects from medication. States taking it regularly.  Patient also  in for follow-up of elevated cholesterol. Doing well without complaints on current medication. Denies side effects  including myalgia and arthralgia and nausea. Also in today for liver function testing. Currently no chest pain, shortness of breath or other cardiovascular related symptoms noted.  Follow-up of diabetes. Patient does check blood sugar at home. Patient denies symptoms such as excessive hunger or urinary frequency, excessive hunger, nausea No significant hypoglycemic spells noted. Medications reviewed. Pt reports taking them regularly. Pt. denies complication/adverse reaction today.    History Tyler Burnett has a past medical history of Diabetes mellitus without complication (HCC) and Hypertension.   He has a past surgical history that includes Fracture surgery and arm surgery (1980).   His family history includes Aneurysm in his mother; Heart disease in his father.He reports that he has never smoked. He has never used smokeless tobacco. He reports that he does not drink alcohol and does not use drugs.  Current Outpatient Medications on File Prior to Visit  Medication Sig Dispense Refill   amLODipine  (NORVASC ) 10 MG tablet TAKE 1 TABLET BY MOUTH EVERY DAY FOR BLOOD PRESSURE 90 tablet 0   atorvastatin  (LIPITOR) 80 MG tablet Take 1 tablet (80 mg total) by mouth daily. 90 tablet 3   Blood Glucose Monitoring Suppl DEVI 1 each by Does not apply route in the morning, at noon, and at bedtime.  May substitute to any manufacturer covered by patient's insurance. 1 each 0   celecoxib  (CELEBREX ) 200 MG capsule Take 1 capsule (200 mg total) by mouth daily. With food 30 capsule 5   dapagliflozin  propanediol (FARXIGA ) 10 MG TABS tablet Take 1 tablet (10 mg total) by mouth daily before breakfast. 90 tablet 5   lisinopril  (ZESTRIL ) 10 MG tablet TAKE 1 TABLET BY MOUTH EVERY DAY 90 tablet 0   metFORMIN  (GLUCOPHAGE ) 1000 MG tablet Take 1 tablet (1,000 mg total) by mouth 2 (two) times daily with a meal. 180 tablet 3   polyethylene glycol powder (GLYCOLAX /MIRALAX ) 17 GM/SCOOP powder Take 17 g by mouth daily. Increase to twice daily if neded for consistent daily BM 3350 g 1   tamsulosin  (FLOMAX ) 0.4 MG CAPS capsule Take 2 capsules (0.8 mg total) by mouth at bedtime. For urine flow and prostate 180 capsule 3   No current facility-administered medications on file prior to visit.    ROS Review of Systems  Constitutional:  Negative for fever.  HENT:  Positive for tinnitus.   Respiratory:  Negative for shortness of breath.   Cardiovascular:  Negative for chest pain.  Musculoskeletal:  Negative for arthralgias.  Skin:  Negative for rash.    Objective:  BP 109/60   Pulse 62   Temp 98.1 F (36.7 C)   Ht 6' (1.829 m)   Wt 254 lb (115.2 kg)   SpO2 95%   BMI 34.45 kg/m   BP Readings from Last 3 Encounters:  06/30/23 109/60  04/13/23 138/78  03/09/23 (!) 150/78    Wt Readings from Last  3 Encounters:  06/30/23 254 lb (115.2 kg)  04/13/23 256 lb (116.1 kg)  03/17/23 230 lb (104.3 kg)    Lab Results  Component Value Date   HGBA1C 8.5 (H) 01/19/2023   HGBA1C 8.0 (H) 09/21/2022   HGBA1C 8.7 (H) 05/04/2022    Physical Exam Vitals reviewed.  Constitutional:      Appearance: He is well-developed.  HENT:     Head: Normocephalic and atraumatic.     Right Ear: External ear normal.     Left Ear: External ear normal.     Mouth/Throat:     Pharynx: No oropharyngeal exudate or posterior  oropharyngeal erythema.  Eyes:     Pupils: Pupils are equal, round, and reactive to light.  Cardiovascular:     Rate and Rhythm: Normal rate and regular rhythm.     Heart sounds: No murmur heard. Pulmonary:     Effort: No respiratory distress.     Breath sounds: Normal breath sounds.  Musculoskeletal:     Cervical back: Normal range of motion and neck supple.  Neurological:     Mental Status: He is alert and oriented to person, place, and time.         Assessment & Plan:  Type 2 diabetes mellitus without complication, without long-term current use of insulin (HCC) -     Bayer DCA Hb A1c Waived  Mixed hyperlipidemia -     Lipid panel -     CMP14+EGFR  Essential hypertension, benign -     CMP14+EGFR  Screen for colon cancer -     Ambulatory referral to Gastroenterology  Other orders -     Rybelsus ; Take 1 tablet (14 mg total) by mouth daily at 12 noon.  Dispense: 90 tablet; Refill: 1    Follow-up: Return in about 3 months (around 09/29/2023).  Roise Cleaver, M.D.

## 2023-07-01 LAB — CMP14+EGFR
ALT: 16 IU/L (ref 0–44)
AST: 13 IU/L (ref 0–40)
Albumin: 4.3 g/dL (ref 3.9–4.9)
Alkaline Phosphatase: 76 IU/L (ref 44–121)
BUN/Creatinine Ratio: 12 (ref 10–24)
BUN: 13 mg/dL (ref 8–27)
Bilirubin Total: 0.6 mg/dL (ref 0.0–1.2)
CO2: 22 mmol/L (ref 20–29)
Calcium: 9.5 mg/dL (ref 8.6–10.2)
Chloride: 101 mmol/L (ref 96–106)
Creatinine, Ser: 1.08 mg/dL (ref 0.76–1.27)
Globulin, Total: 2.5 g/dL (ref 1.5–4.5)
Glucose: 199 mg/dL — ABNORMAL HIGH (ref 70–99)
Potassium: 4.5 mmol/L (ref 3.5–5.2)
Sodium: 139 mmol/L (ref 134–144)
Total Protein: 6.8 g/dL (ref 6.0–8.5)
eGFR: 76 mL/min/1.73

## 2023-07-01 LAB — LIPID PANEL
Chol/HDL Ratio: 2.6 ratio (ref 0.0–5.0)
Cholesterol, Total: 123 mg/dL (ref 100–199)
HDL: 48 mg/dL
LDL Chol Calc (NIH): 60 mg/dL (ref 0–99)
Triglycerides: 77 mg/dL (ref 0–149)
VLDL Cholesterol Cal: 15 mg/dL (ref 5–40)

## 2023-07-04 ENCOUNTER — Encounter: Payer: Self-pay | Admitting: Family Medicine

## 2023-07-06 ENCOUNTER — Telehealth: Payer: Self-pay | Admitting: Pharmacist

## 2023-07-06 NOTE — Telephone Encounter (Signed)
   Patient enrolled in the [employer] patient assistance program for Rybelsus .  Dose increased to Rybelsus  14mg  daily noted by PCP on 06/30/23.   Tyler Burnett, can you route me dose change form for PCP to sign.   Patient notified to take 2 tablets to equal Rybelsus  14mg  daily and that his Rybelsus  shipment is up front for pick up (Rybelsus  7mg  #4 month supply) Continue all medications as prescribed by PCP. Routing to PCP as an Financial planner.   Tyler Burnett Tyler Burnett, PharmD, BCACP, CPP Clinical Pharmacist, [facility]

## 2023-07-15 ENCOUNTER — Telehealth: Payer: Self-pay

## 2023-07-15 NOTE — Progress Notes (Signed)
 Pharmacy Medication Assistance Program Note    07/15/2023  Patient ID: Tyler Burnett, male   DOB: 1955-10-23, 68 y.o.   MRN: [id number]     07/15/2023  [employer]  [employer] Novo Nordisk  [facility] Rybelsus   Dose of Rybelsus  14MG   Type of Sport and exercise psychologist  Patient Assistance Determination Approved  Approval End Date 11/25/2023     Faxed dose increase to 14MG  on 07/01/23

## 2023-07-19 ENCOUNTER — Encounter (INDEPENDENT_AMBULATORY_CARE_PROVIDER_SITE_OTHER): Payer: Self-pay | Admitting: *Deleted

## 2023-07-29 ENCOUNTER — Other Ambulatory Visit: Payer: Self-pay | Admitting: Family Medicine

## 2023-08-09 ENCOUNTER — Other Ambulatory Visit: Payer: Self-pay | Admitting: Family Medicine

## 2023-08-19 ENCOUNTER — Other Ambulatory Visit: Payer: Self-pay | Admitting: Family Medicine

## 2023-08-19 DIAGNOSIS — I10 Essential (primary) hypertension: Secondary | ICD-10-CM

## 2023-09-07 ENCOUNTER — Other Ambulatory Visit: Payer: Self-pay | Admitting: Family Medicine

## 2023-09-07 DIAGNOSIS — I10 Essential (primary) hypertension: Secondary | ICD-10-CM

## 2023-09-22 ENCOUNTER — Telehealth: Payer: Self-pay

## 2023-09-22 DIAGNOSIS — E119 Type 2 diabetes mellitus without complications: Secondary | ICD-10-CM

## 2023-09-22 NOTE — Telephone Encounter (Signed)
 Received a refill request fax from [employer] patient assistance company for patients FARXIGA  medication.   Please send a 90 day supply (with refills) to [facility] if applicable. Thanks!

## 2023-09-23 MED ORDER — DAPAGLIFLOZIN PROPANEDIOL 10 MG PO TABS: 10.0000 mg | ORAL_TABLET | Freq: Every day | ORAL | 5 refills | Status: AC

## 2023-09-23 NOTE — Telephone Encounter (Signed)
 done

## 2023-09-24 ENCOUNTER — Telehealth: Payer: Self-pay

## 2023-10-07 ENCOUNTER — Encounter: Payer: Self-pay | Admitting: Family Medicine

## 2023-10-07 ENCOUNTER — Ambulatory Visit: Admitting: Family Medicine

## 2023-10-07 VITALS — BP 131/67 | HR 57 | Temp 97.2°F | Ht 72.0 in | Wt 251.2 lb

## 2023-10-07 DIAGNOSIS — N481 Balanitis: Secondary | ICD-10-CM

## 2023-10-07 DIAGNOSIS — E119 Type 2 diabetes mellitus without complications: Secondary | ICD-10-CM

## 2023-10-07 DIAGNOSIS — I10 Essential (primary) hypertension: Secondary | ICD-10-CM

## 2023-10-07 DIAGNOSIS — E782 Mixed hyperlipidemia: Secondary | ICD-10-CM

## 2023-10-07 LAB — CBC WITH DIFFERENTIAL/PLATELET
Basophils Absolute: 0 x10E3/uL (ref 0.0–0.2)
Basos: 0 %
EOS (ABSOLUTE): 0.3 x10E3/uL (ref 0.0–0.4)
Eos: 5 %
Hematocrit: 48.1 % (ref 37.5–51.0)
Hemoglobin: 15.7 g/dL (ref 13.0–17.7)
Immature Grans (Abs): 0 x10E3/uL (ref 0.0–0.1)
Immature Granulocytes: 0 %
Lymphocytes Absolute: 2.4 x10E3/uL (ref 0.7–3.1)
Lymphs: 42 %
MCH: 30 pg (ref 26.6–33.0)
MCHC: 32.6 g/dL (ref 31.5–35.7)
MCV: 92 fL (ref 79–97)
Monocytes Absolute: 0.4 x10E3/uL (ref 0.1–0.9)
Monocytes: 8 %
Neutrophils Absolute: 2.6 x10E3/uL (ref 1.4–7.0)
Neutrophils: 45 %
Platelets: 227 x10E3/uL (ref 150–450)
RBC: 5.24 x10E6/uL (ref 4.14–5.80)
RDW: 13.3 % (ref 11.6–15.4)
WBC: 5.7 x10E3/uL (ref 3.4–10.8)

## 2023-10-07 LAB — CMP14+EGFR
ALT: 14 IU/L (ref 0–44)
AST: 9 IU/L (ref 0–40)
Albumin: 4.3 g/dL (ref 3.9–4.9)
Alkaline Phosphatase: 68 IU/L (ref 44–121)
BUN/Creatinine Ratio: 14 (ref 10–24)
BUN: 16 mg/dL (ref 8–27)
Bilirubin Total: 0.5 mg/dL (ref 0.0–1.2)
CO2: 20 mmol/L (ref 20–29)
Calcium: 9.2 mg/dL (ref 8.6–10.2)
Chloride: 98 mmol/L (ref 96–106)
Creatinine, Ser: 1.13 mg/dL (ref 0.76–1.27)
Globulin, Total: 2.5 g/dL (ref 1.5–4.5)
Glucose: 176 mg/dL — ABNORMAL HIGH (ref 70–99)
Potassium: 4.7 mmol/L (ref 3.5–5.2)
Sodium: 136 mmol/L (ref 134–144)
Total Protein: 6.8 g/dL (ref 6.0–8.5)
eGFR: 72 mL/min/1.73 (ref >59)

## 2023-10-07 LAB — LIPID PANEL
Chol/HDL Ratio: 2.7 ratio (ref 0.0–5.0)
Cholesterol, Total: 125 mg/dL (ref 100–199)
HDL: 47 mg/dL (ref >39)
LDL Chol Calc (NIH): 63 mg/dL (ref 0–99)
Triglycerides: 72 mg/dL (ref 0–149)
VLDL Cholesterol Cal: 15 mg/dL (ref 5–40)

## 2023-10-07 LAB — BAYER DCA HB A1C WAIVED: HB A1C (BAYER DCA - WAIVED): 7.3 % — ABNORMAL HIGH (ref 4.8–5.6)

## 2023-10-07 MED ORDER — CIPROFLOXACIN HCL 500 MG PO TABS: 500.0000 mg | ORAL_TABLET | Freq: Two times a day (BID) | ORAL | 0 refills | Status: DC

## 2023-10-07 MED ORDER — TIRZEPATIDE 5 MG/0.5ML ~~LOC~~ SOAJ
5.0000 mg | SUBCUTANEOUS | 2 refills | Status: DC
Start: 2023-10-07 — End: 2023-11-05

## 2023-10-07 NOTE — Progress Notes (Signed)
 Subjective:  Patient ID: Tyler Burnett,  male    DOB: 07-17-1955  Age: 68 y.o.    CC: Medical Management of Chronic Issues   HPI Furious Incorvaia presents for  follow-up of hypertension. Patient has no history of headache chest pain or shortness of breath or recent cough. Patient also denies symptoms of TIA such as numbness weakness lateralizing. Patient denies side effects from medication. States taking it regularly.  Patient also  in for follow-up of elevated cholesterol. Doing well without complaints on current medication. Denies side effects  including myalgia and arthralgia and nausea. Also in today for liver function testing. Currently no chest pain, shortness of breath or other cardiovascular related symptoms noted.  Follow-up of diabetes. Patient does check blood sugar at home occasionally.  Patient denies symptoms such as excessive hunger or urinary frequency, excessive hunger, nausea No significant hypoglycemic spells noted. Medications reviewed. Pt reports taking them regularly. Pt. denies complication/adverse reaction today.    History Heston has a past medical history of Diabetes mellitus without complication (HCC) and Hypertension.   He has a past surgical history that includes Fracture surgery and arm surgery (1980).   His family history includes Aneurysm in his mother; Heart disease in his father.He reports that he has never smoked. He has never used smokeless tobacco. He reports that he does not drink alcohol and does not use drugs.  Current Outpatient Medications on File Prior to Visit  Medication Sig Dispense Refill   amLODipine  (NORVASC ) 10 MG tablet TAKE 1 TABLET BY MOUTH EVERY DAY FOR BLOOD PRESSURE 90 tablet 0   atorvastatin  (LIPITOR) 80 MG tablet Take 1 tablet (80 mg total) by mouth daily. 90 tablet 3   Blood Glucose Monitoring Suppl DEVI 1 each by Does not apply route in the morning, at noon, and at bedtime. May substitute to any manufacturer covered by  patient's insurance. 1 each 0   celecoxib  (CELEBREX ) 200 MG capsule TAKE 1 CAPSULE (200 MG TOTAL) BY MOUTH DAILY. WITH FOOD 90 capsule 0   dapagliflozin  propanediol (FARXIGA ) 10 MG TABS tablet Take 1 tablet (10 mg total) by mouth daily before breakfast. 90 tablet 5   lisinopril  (ZESTRIL ) 10 MG tablet TAKE 1 TABLET BY MOUTH EVERY DAY 90 tablet 0   metFORMIN  (GLUCOPHAGE ) 1000 MG tablet Take 1 tablet (1,000 mg total) by mouth 2 (two) times daily with a meal. 180 tablet 3   polyethylene glycol powder (GLYCOLAX /MIRALAX ) 17 GM/SCOOP powder Take 17 g by mouth daily. Increase to twice daily if neded for consistent daily BM 3350 g 1   tamsulosin  (FLOMAX ) 0.4 MG CAPS capsule Take 2 capsules (0.8 mg total) by mouth at bedtime. For urine flow and prostate 180 capsule 3   No current facility-administered medications on file prior to visit.    ROS Review of Systems  Constitutional: Negative.   HENT: Negative.    Eyes:  Negative for visual disturbance.  Respiratory:  Negative for cough and shortness of breath.   Cardiovascular:  Negative for chest pain and leg swelling.  Gastrointestinal:  Negative for abdominal pain, diarrhea, nausea and vomiting.  Genitourinary:  Positive for penile pain (burning pain, red at tip. NOT dysuria. Outside is raw). Negative for difficulty urinating.  Musculoskeletal:  Negative for arthralgias and myalgias.  Skin:  Negative for rash.  Neurological:  Positive for numbness (ball of left foot like dead skin). Negative for headaches.  Psychiatric/Behavioral:  Negative for sleep disturbance.     Objective:  BP 131/67  Pulse (!) 57   Temp (!) 97.2 F (36.2 C)   Ht 6' (1.829 m)   Wt 251 lb 3.2 oz (113.9 kg)   SpO2 96%   BMI 34.07 kg/m   BP Readings from Last 3 Encounters:  10/07/23 131/67  06/30/23 109/60  04/13/23 138/78    Wt Readings from Last 3 Encounters:  10/07/23 251 lb 3.2 oz (113.9 kg)  06/30/23 254 lb (115.2 kg)  04/13/23 256 lb (116.1 kg)    Lab  Results  Component Value Date   HGBA1C 7.3 (H) 10/07/2023   HGBA1C 7.5 (H) 06/30/2023   HGBA1C 8.5 (H) 01/19/2023    Physical Exam Vitals reviewed.  Constitutional:      Appearance: He is well-developed.  HENT:     Head: Normocephalic and atraumatic.     Right Ear: External ear normal.     Left Ear: External ear normal.     Mouth/Throat:     Pharynx: No oropharyngeal exudate or posterior oropharyngeal erythema.  Eyes:     Pupils: Pupils are equal, round, and reactive to light.  Cardiovascular:     Rate and Rhythm: Normal rate and regular rhythm.     Heart sounds: No murmur heard. Pulmonary:     Effort: No respiratory distress.     Breath sounds: Normal breath sounds.  Genitourinary:    Comments: Erythema of glans penis without edema or discharge Musculoskeletal:     Cervical back: Normal range of motion and neck supple.  Neurological:     Mental Status: He is alert and oriented to person, place, and time.      Diabetic foot exam was performed with the following findings:   No deformities, ulcerations, or other skin breakdown Intact posterior tibialis and dorsalis pedis pulses Pt. Exhibits decreased sensation for light touch at the great toe and the  ball of the foot bilaterally. Some loss at each toe is noted as well.       Assessment & Plan:  Type 2 diabetes mellitus without complication, without long-term current use of insulin (HCC) -     CBC with Differential/Platelet -     CMP14+EGFR -     Bayer DCA Hb A1c Waived  Mixed hyperlipidemia -     Lipid panel  Essential hypertension, benign -     CMP14+EGFR  Balanitis  Other orders -     Tirzepatide ; Inject 5 mg into the skin once a week.  Dispense: 2 mL; Refill: 2 -     Ciprofloxacin  HCl; Take 1 tablet (500 mg total) by mouth 2 (two) times daily.  Dispense: 20 tablet; Refill: 0    Follow-up: Return in about 6 weeks (around 11/18/2023).  Butler Der, M.D.

## 2023-10-13 ENCOUNTER — Ambulatory Visit: Payer: Self-pay | Admitting: Family Medicine

## 2023-10-14 NOTE — Telephone Encounter (Signed)
 Patient aware and verbalized understanding.  Patient reports the medication sent in for his rash has not helped. Is there anything else that can be done?

## 2023-10-15 ENCOUNTER — Ambulatory Visit: Payer: Self-pay

## 2023-10-15 NOTE — Telephone Encounter (Signed)
 Call disconnected upon transfer to NT. This RN called number on file, no answer, LVM. Will p;lace in CB folder for further attempts  Copied from CRM #[id number]. Topic: Clinical - Red Word Triage >> Oct 15, 2023  3:01 PM Mia F wrote: Red Word that prompted transfer to Nurse Triage: PT was given a rx for a rash. He has been taking it for 8 days with no relief.

## 2023-10-15 NOTE — Telephone Encounter (Signed)
 FYI Only or Action Required?: Action required by provider: rash is not better would like something else called in.  Patient was last seen in primary care on 10/07/2023 by Zollie Lowers, MD.  Called Nurse Triage reporting Rash.  Symptoms began several weeks ago.  Interventions attempted: Prescription medications: states is not working.  Symptoms are: unchanged.  Triage Disposition: No disposition on file.  Patient/caregiver understands and will follow disposition?:     Call disconnected upon transfer to NT. This RN called number on file, no answer, LVM. Will p;lace in CB folder for further attempts  Copied from CRM #[id number]. Topic: Clinical - Red Word Triage >> Oct 15, 2023  3:01 PM Mia F wrote: Red Word that prompted transfer to Nurse Triage: PT was given a rx for a rash. He has been taking it for 8 days with no relief. Reason for Disposition  Mild widespread rash  (Exception: Heat rash lasting 3 days or less.)  Answer Assessment - Initial Assessment Questions 1. APPEARANCE of RASH: What does the rash look like? (e.g., blisters, dry flaky skin, red spots, redness, sores)     Rash to genitals and states was seen for this but rx has not help 2. SIZE: How big are the spots? (e.g., tip of pen, eraser, coin; inches, centimeters)     States rash is about the same side 3. LOCATION: Where is the rash located?     genitals 4. COLOR: What color is the rash? (Note: It is difficult to assess rash color in people with darker-colored skin. When this situation occurs, simply ask the caller to describe what they see.)     Red and raised 5. ONSET: When did the rash begin?     About a week and a half ago 6. FEVER: Do you have a fever? If Yes, ask: What is your temperature, how was it measured, and when did it start?     no 7. ITCHING: Does the rash itch? If Yes, ask: How bad is the itch? (Scale 1-10; or mild, moderate, severe)     no 8. CAUSE: What do you think is causing the  rash?     unknown 9. MEDICINE FACTORS: Have you started any new medicines within the last 2 weeks? (e.g., antibiotics)     na 10. OTHER SYMPTOMS: Do you have any other symptoms? (e.g., dizziness, headache, sore throat, joint pain)       no 11. PREGNANCY: Is there any chance you are pregnant? When was your last menstrual period?       na  Protocols used: Rash or Redness - [facility]

## 2023-10-15 NOTE — Telephone Encounter (Signed)
 Pt states he has been taking the antibiotic but it hasn't completely cleared up. It isn't worse and wants to know if there is anything else that can be sent in. Pt aware you are out of the office till Monday and offered appt Monday but he declined wanting to see if you would send something in first. Please advise.

## 2023-10-18 ENCOUNTER — Ambulatory Visit (INDEPENDENT_AMBULATORY_CARE_PROVIDER_SITE_OTHER): Admitting: Family Medicine

## 2023-10-18 ENCOUNTER — Encounter: Payer: Self-pay | Admitting: Family Medicine

## 2023-10-18 VITALS — BP 136/70 | HR 58 | Temp 97.9°F | Ht 72.0 in | Wt 251.6 lb

## 2023-10-18 DIAGNOSIS — N481 Balanitis: Secondary | ICD-10-CM

## 2023-10-18 MED ORDER — CLOTRIMAZOLE-BETAMETHASONE 1-0.05 % EX CREA: 1.0000 | TOPICAL_CREAM | Freq: Two times a day (BID) | CUTANEOUS | 0 refills | Status: DC

## 2023-10-18 MED ORDER — FLUCONAZOLE 100 MG PO TABS
ORAL_TABLET | ORAL | 0 refills | Status: DC
Start: 2023-10-18 — End: 2023-11-10

## 2023-10-18 NOTE — Telephone Encounter (Signed)
 See if it started to clear up over the weekend

## 2023-10-18 NOTE — Telephone Encounter (Signed)
Pt has appt today, will close encounter.

## 2023-10-18 NOTE — Progress Notes (Signed)
 Subjective:  Patient ID: Tyler Burnett, male    DOB: Jul 25, 1955  Age: 68 y.o. MRN: [id number]  CC: Follow-up (RASH, GETTING WORSE)   HPI  Discussed the use of AI scribe software for clinical note transcription with the patient, who gave verbal consent to proceed.  History of Present Illness   Tyler Burnett is a 68 year old male who presents with a persistent and worsening rash on the penis.  He has a persistent rash on the penis that has worsened over time. Initially, he could tolerate new treatments, but now he cannot tolerate anything new. He describes a sensation as if a 'rubber band had tightened up around it,' making it difficult to retract the foreskin, which required significant effort.  He completed a 10-day course of ciprofloxacin , an antibiotic, a day or two ago, but it did not improve his symptoms. He has not applied any topical treatments recently, except for Vaseline once, which he used because of a burning sensation at the top of the affected area.  He notes significant swelling and redness, and describes the area as very uncomfortable. The swelling was so severe that he checked his underwear to ensure nothing was tangled around it.  He attempted to contact the medical office last Wednesday and Thursday to report his symptoms but did not receive a timely response, which delayed his care until today.             10/18/2023    1:59 PM 10/07/2023    7:54 AM 06/30/2023   10:58 AM  Depression screen PHQ 2/9  Decreased Interest 0 0 0  Down, Depressed, Hopeless 0 0 0  PHQ - 2 Score 0 0 0  Altered sleeping   0  Tired, decreased energy   0  Change in appetite   0  Feeling bad or failure about yourself    0  Trouble concentrating   0  Moving slowly or fidgety/restless   0  Suicidal thoughts   0  PHQ-9 Score   0  Difficult doing work/chores   Not difficult at all    History Gerrad has a past medical history of Diabetes mellitus without complication (HCC) and  Hypertension.   He has a past surgical history that includes Fracture surgery and arm surgery (1980).   His family history includes Aneurysm in his mother; Heart disease in his father.He reports that he has never smoked. He has never used smokeless tobacco. He reports that he does not drink alcohol and does not use drugs.    ROS Review of Systems  Objective:  BP 136/70   Pulse (!) 58   Temp 97.9 F (36.6 C)   Ht 6' (1.829 m)   Wt 251 lb 9.6 oz (114.1 kg)   SpO2 95%   BMI 34.12 kg/m   BP Readings from Last 3 Encounters:  10/18/23 136/70  10/07/23 131/67  06/30/23 109/60    Wt Readings from Last 3 Encounters:  10/18/23 251 lb 9.6 oz (114.1 kg)  10/07/23 251 lb 3.2 oz (113.9 kg)  06/30/23 254 lb (115.2 kg)     Physical Exam  Physical Exam   GENITOURINARY: Penis is red and swollen. At the glans and foreskin     Assessment & Plan:  Balanitis  Other orders -     Fluconazole ; Take two with first dose. Then starting the next day take one daily until all are taken.  Dispense: 15 tablet; Refill: 0 -     Clotrimazole -Betamethasone ; Apply 1  Application topically 2 (two) times daily.  Dispense: 45 g; Refill: 0    Assessment and Plan    Balanitis Persistent balanitis with worsening symptoms despite ciprofloxacin . Symptoms suggest yeast infection over bacterial. - Prescribed oral antifungal: two pills today, then one daily for two weeks. - Prescribed antifungal cream: apply twice daily. - Sent prescriptions to [facility] in [place].          Follow-up: No follow-ups on file.  Butler Der, M.D.

## 2023-10-18 NOTE — Progress Notes (Signed)
 PATIENT HAS APPOINTMENT IN OFFICE TODAY. WILL DISCUSS AT THAT TIME.

## 2023-11-04 ENCOUNTER — Other Ambulatory Visit: Payer: Self-pay | Admitting: Family Medicine

## 2023-11-05 ENCOUNTER — Telehealth: Payer: Self-pay | Admitting: Pharmacist

## 2023-11-05 DIAGNOSIS — I10 Essential (primary) hypertension: Secondary | ICD-10-CM

## 2023-11-05 MED ORDER — LISINOPRIL 10 MG PO TABS: 10.0000 mg | ORAL_TABLET | Freq: Every day | ORAL | 0 refills | Status: DC

## 2023-11-05 NOTE — Telephone Encounter (Signed)
 This patient is due for renewal.   Is he still taking? And did this shipment ever arrive?!

## 2023-11-05 NOTE — Telephone Encounter (Signed)
 06/17/2023 Name: Tyler Burnett MRN: [id number] DOB: 29-May-1955  No chief complaint on file.   Tyler Burnett is a 68 y.o. year old male who presented for a telephone visit.   They were referred to the pharmacist by their PCP for assistance in managing diabetes, hypertension, and medication access.    Subjective:  Care Team: Primary Care Provider: Zollie Lowers, MD ;  Medication Access/Adherence  [facility]  [phone] [place] - [address] [place] [address] Phone: [phone] Fax: [phone]  [id number] - [place], [place] - [address] [facility] [address] [place] [address] Phone: [phone] Fax: [phone]  [facility] - [place], [place] - [address] [place] [address] Phone: [phone] Fax: [phone]  Patient reports affordability concerns with their medications: Yes  Patient reports access/transportation concerns to their pharmacy: No  Patient reports adherence concerns with their medications:  Yes  only due to cost; patient very adherent when medications are available   Diabetes:  Current medications: metformin  1000 mg twice daily, Farxiga  10 mg daily, Rybelsus  7mg  daily-->14mg  daily Medications tried in the past: Rybelsus  (recently ran out ~2.5 weeks ago and was unable to afford ~$500 copay, was approved for Novo Nordisk PAP and awaiting shipment);  pioglitazone  30 mg daily, Mounjaro --never picked up due to cost   Past reports-->Patient reports adherence to medications. Despite metformin  last fill date in 2023 on epic fill hx, he confirmed he has 3 bottles of metformin  1000 mg and has been taking this twice daily. States he uses a pill box to organize his medications and denies any missed doses. Continues on Farxiga  via [facility]/Az&me PAP. Pt's wife keeps a log of blood sugar readings, but he did not know where she  keeps it. Says generally blood sugars have been ~100s, sometimes higher and he has noticed improvement since starting on Farxiga .   Had one episode of dizziness when he skipped a meal. Dizziness improved when he ate a meal. Patient denies hyperglycemic symptoms including polyuria, polydipsia, polyphagia, nocturia, neuropathy, blurred vision.   Current medication access support: AZ&Me PAP for Farxiga , Novo Nordisk PAP for Rybelsus     Objective:  Lab Results  Component Value Date   HGBA1C 7.3 (H) 10/07/2023    Lab Results  Component Value Date   CREATININE 1.13 10/07/2023   BUN 16 10/07/2023   NA 136 10/07/2023   K 4.7 10/07/2023   CL 98 10/07/2023   CO2 20 10/07/2023    Lab Results  Component Value Date   CHOL 125 10/07/2023   HDL 47 10/07/2023   LDLCALC 63 10/07/2023   TRIG 72 10/07/2023   CHOLHDL 2.7 10/07/2023    Medications Reviewed Today     Reviewed by Tyler Burnett, [facility] (Pharmacist) on 11/05/23 at 1337  Med List Status: <None>   Medication Order Taking? Sig Documenting Provider Last Dose Status Informant  amLODipine  (NORVASC ) 10 MG tablet [id number]  TAKE 1 TABLET BY MOUTH EVERY DAY FOR BLOOD PRESSURE Tyler Lowers, MD  Active   atorvastatin  (LIPITOR) 80 MG tablet [id number]  Take 1 tablet (80 mg total) by mouth daily. Tyler Lowers, MD  Active   Blood Glucose Monitoring Suppl DEVI [id number]  1 each by Does not apply route in the morning, at noon, and at bedtime. May substitute to any manufacturer covered by patient's insurance. Tyler Lowers,  MD  Active   celecoxib  (CELEBREX ) 200 MG capsule [id number]  TAKE 1 CAPSULE (200 MG TOTAL) BY MOUTH DAILY. WITH FOOD Tyler Lowers, MD  Active   ciprofloxacin  (CIPRO ) 500 MG tablet [id number]  Take 1 tablet (500 mg total) by mouth 2 (two) times daily. Tyler Lowers, MD  Active   clotrimazole -betamethasone  (LOTRISONE ) cream [id number]  Apply 1 Application topically 2 (two) times daily. Tyler Lowers, MD  Active    dapagliflozin  propanediol (FARXIGA ) 10 MG TABS tablet [id number]  Take 1 tablet (10 mg total) by mouth daily before breakfast. Tyler Lowers, MD  Active   fluconazole  (DIFLUCAN ) 100 MG tablet [id number]  Take two with first dose. Then starting the next day take one daily until all are taken. Tyler Lowers, MD  Active   lisinopril  (ZESTRIL ) 10 MG tablet [id number]  Take 1 tablet (10 mg total) by mouth daily. Tyler Lowers, MD  Active   metFORMIN  (GLUCOPHAGE ) 1000 MG tablet [id number]  Take 1 tablet (1,000 mg total) by mouth 2 (two) times daily with a meal. Tyler Lowers, MD  Active   polyethylene glycol powder (GLYCOLAX /MIRALAX ) 17 GM/SCOOP powder [id number]  Take 17 g by mouth daily. Increase to twice daily if neded for consistent daily BM Tyler Lowers, MD  Active   tamsulosin  (FLOMAX ) 0.4 MG CAPS capsule [id number]  Take 2 capsules (0.8 mg total) by mouth at bedtime. For urine flow and prostate Tyler Lowers, MD  Active     Discontinued 11/05/23 1327 (Change in therapy)            Med Note>> Tyler Burnett [facility]   11/05/2023  1:27 PM too expensive per patient               Assessment/Plan:   Diabetes: - Currently uncontrolled, A1c continues to improve. Patient plans to pick up today. Counseled on adverse effects and limiting portion sizes/stopping eating when satisfied to minimize GI adverse effects.  - Reviewed long term cardiovascular and renal outcomes of uncontrolled blood sugar - Reviewed goal A1c, goal fasting, and goal 2 hour post prandial glucose - Reviewed dietary modifications including emphasizing intake of protein and limiting intake of carb heavy foods - Continue Rybelsus  14 mg daily. Reviewed appropriate administration on an empty stomach first thing in the morning with water at least 30 minutes before other medications, food, or drink.  - Recommend to continue metformin  1000 mg BID - Recommend to continue Farxiga  10 mg daily - Patient denies personal or family history of  multiple endocrine neoplasia type 2, medullary thyroid cancer; personal history of pancreatitis or gallbladder disease. - Recommend to check FBG daily - Approved for Novo Nordisk PAP for Rybelsus  and AZ&Me PAP for Farxiga  -compliant with statin -refills sent for lisinopril     Follow Up Plan: PharmD in 3 weeks  Mliss Tarry Tyler, PharmD, BCACP, CPP Clinical Pharmacist, [facility]

## 2023-11-08 ENCOUNTER — Telehealth: Payer: Self-pay

## 2023-11-08 ENCOUNTER — Other Ambulatory Visit (HOSPITAL_COMMUNITY): Payer: Self-pay

## 2023-11-08 MED ORDER — CELECOXIB 200 MG PO CAPS
200.0000 mg | ORAL_CAPSULE | Freq: Every day | ORAL | 0 refills | Status: DC
Start: 2023-11-08 — End: 2024-01-20

## 2023-11-08 NOTE — Addendum Note (Signed)
 Addended by: Shandiin Eisenbeis D on: 11/08/2023 10:05 AM   Modules accepted: Orders

## 2023-11-08 NOTE — Progress Notes (Unsigned)
 Pharmacy Medication Assistance Program Note    11/08/2023  Patient ID: Tyler Burnett, male  DOB: 28-Nov-1955, 68 y.o.  MRN:  [id number]     11/08/2023  Outreach Medication Two  Manufacturer Medication Two Novo Nordisk  [facility] Rybelsus   Type of Journalist, newspaper to [employer]  Date Application Submitted to [employer] 11/08/2023    Renewal   PCP/RX pages emailed to [employer].

## 2023-11-10 ENCOUNTER — Encounter: Payer: Self-pay | Admitting: Family Medicine

## 2023-11-10 ENCOUNTER — Ambulatory Visit (INDEPENDENT_AMBULATORY_CARE_PROVIDER_SITE_OTHER): Admitting: Family Medicine

## 2023-11-10 VITALS — BP 115/59 | HR 65 | Temp 98.2°F | Ht 72.0 in | Wt 248.0 lb

## 2023-11-10 DIAGNOSIS — J069 Acute upper respiratory infection, unspecified: Secondary | ICD-10-CM | POA: Diagnosis not present

## 2023-11-10 DIAGNOSIS — H1013 Acute atopic conjunctivitis, bilateral: Secondary | ICD-10-CM | POA: Diagnosis not present

## 2023-11-10 MED ORDER — BENZONATATE 100 MG PO CAPS: 100.0000 mg | ORAL_CAPSULE | Freq: Three times a day (TID) | ORAL | 0 refills | Status: DC | PRN

## 2023-11-10 NOTE — Progress Notes (Signed)
 Acute Office Visit  Subjective:     Patient ID: Tyler Burnett, male    DOB: [birth year], 68 y.o.   MRN: [id number]  Chief Complaint  Patient presents with   Cough    Cough    History of Present Illness   Tyler Burnett is a 68 year old male with type 2 diabetes who presents with a persistent cough and eye symptoms.  Cough and upper respiratory symptoms - Persistent cough for three days, worsening despite use of DayQuil and NyQuil - Initially productive of yellowish phlegm, now clear and less frequent - Cough associated with fits causing significant side pain - Sore throat and body aches attributed to coughing - No wheezing, shortness of breath, fever, ear pain, or recent exposure to illness - No history of allergies, asthma, or COPD  Ocular symptoms - Sudden onset while outside - Associated eye itchiness - Partial relief with over-the-counter eye drops, but persistent redness - No eye pain  Diabetes mellitus type 2 - Type 2 diabetes mellitus - Recent hemoglobin A1c of 7.3       Review of Systems  Respiratory:  Positive for cough.    As per HPI.     Objective:    BP (!) 115/59   Pulse 65   Temp 98.2 F (36.8 C) (Temporal)   Ht 6' (1.829 m)   Wt 248 lb (112.5 kg)   SpO2 97%   BMI 33.63 kg/m    Physical Exam Vitals and nursing note reviewed.  Constitutional:      General: He is not in acute distress.    Appearance: He is obese. He is not ill-appearing, toxic-appearing or diaphoretic.  HENT:     Right Ear: Tympanic membrane, ear canal and external ear normal.     Left Ear: Tympanic membrane, ear canal and external ear normal.     Nose: Congestion present.     Mouth/Throat:     Mouth: Mucous membranes are moist.     Pharynx: Oropharynx is clear. Posterior oropharyngeal erythema present. No pharyngeal swelling, oropharyngeal exudate, uvula swelling or postnasal drip.     Tonsils: No tonsillar exudate. 1+ on the right. 1+ on the left.  Eyes:      General: Lids are normal.     Extraocular Movements: Extraocular movements intact.     Conjunctiva/sclera:     Right eye: Right conjunctiva is injected. No exudate.    Left eye: Left conjunctiva is injected. No exudate. Cardiovascular:     Rate and Rhythm: Normal rate and regular rhythm.     Heart sounds: No murmur heard. Pulmonary:     Effort: Pulmonary effort is normal. No respiratory distress.     Breath sounds: Normal breath sounds. No wheezing, rhonchi or rales.  Abdominal:     General: Bowel sounds are normal. There is no distension.     Palpations: Abdomen is soft.     Tenderness: There is no abdominal tenderness. There is no guarding or rebound.  Musculoskeletal:     Cervical back: Neck supple. No rigidity.     Right lower leg: No edema.     Left lower leg: No edema.  Lymphadenopathy:     Cervical: No cervical adenopathy.  Skin:    General: Skin is warm.  Neurological:     General: No focal deficit present.     Mental Status: He is alert and oriented to person, place, and time.  Psychiatric:        Mood and Affect: Mood  normal.        Behavior: Behavior normal.     No results found for any visits on 11/10/23.      Assessment & Plan:   Tyler Burnett was seen today for cough.  Diagnoses and all orders for this visit:  Acute URI -     benzonatate  (TESSALON  PERLES) 100 MG capsule; Take 1-2 capsules (100-200 mg total) by mouth 3 (three) times daily as needed. -     Novel Coronavirus, NAA ([facility])  Allergic conjunctivitis of both eyes      Acute URI Likely viral etiology, possibly exacerbated by allergies. COVID-19 considered due to symptoms and virus prevalence. - Prescribed Tessalon  Perles, 1-2 capsules three times daily for cough. - Continue DayQuil and NyQuil as needed. - Sent COVID-19 test for analysis. - Advised to monitor symptoms and report if worsening or no improvement after a week.  Bilateral allergic conjunctivitis - Continue over-the-counter eye  drops, antihistamines for symptomatic relief.        Return to office for new or worsening symptoms, or if symptoms persist.   The patient indicates understanding of these issues and agrees with the plan.  Annabella CHRISTELLA Search, FNP

## 2023-11-12 LAB — NOVEL CORONAVIRUS, NAA: SARS-CoV-2, NAA: NOT DETECTED

## 2023-11-15 ENCOUNTER — Ambulatory Visit: Payer: Self-pay | Admitting: Family Medicine

## 2023-11-18 ENCOUNTER — Ambulatory Visit: Admitting: Family Medicine

## 2023-11-26 ENCOUNTER — Telehealth: Payer: Self-pay | Admitting: Pharmacist

## 2023-11-26 NOTE — Telephone Encounter (Signed)
   Rybelsus  via Novo Nordisk patient assistance program is here at your doctor's office for pick up. Please pick up within 5 business days if you are able due to our limited storage space. Patient notified and stable on current regimen.  Chavon Lucarelli Dattero Nichele Slawson, PharmD, BCACP, CPP Clinical Pharmacist, [facility]

## 2023-12-02 ENCOUNTER — Other Ambulatory Visit

## 2023-12-06 ENCOUNTER — Other Ambulatory Visit: Payer: Self-pay | Admitting: Family Medicine

## 2023-12-06 DIAGNOSIS — I10 Essential (primary) hypertension: Secondary | ICD-10-CM

## 2023-12-06 NOTE — Telephone Encounter (Signed)
 Stacks NTBS for 6 wks FU from Aug RF sent to pharmacy

## 2023-12-06 NOTE — Telephone Encounter (Signed)
 I called pt & I made him an appt on 12-20-2023 w/Stacks for med refill.

## 2023-12-15 ENCOUNTER — Other Ambulatory Visit

## 2023-12-20 ENCOUNTER — Ambulatory Visit: Admitting: Family Medicine

## 2023-12-22 ENCOUNTER — Other Ambulatory Visit

## 2023-12-23 ENCOUNTER — Telehealth: Payer: Self-pay

## 2023-12-24 ENCOUNTER — Telehealth: Payer: Self-pay | Admitting: Family Medicine

## 2023-12-24 DIAGNOSIS — E119 Type 2 diabetes mellitus without complications: Secondary | ICD-10-CM

## 2023-12-24 MED ORDER — METFORMIN HCL 1000 MG PO TABS: 1000.0000 mg | ORAL_TABLET | Freq: Two times a day (BID) | ORAL | 0 refills | Status: DC

## 2023-12-24 NOTE — Telephone Encounter (Signed)
  Prescription Request  12/24/2023  Is this a Controlled Substance medicine? NO  Have you seen your PCP in the last 2 weeks? NO  If YES, route message to pool  -  If NO, patient needs to be scheduled for appointment.  What is the name of the medication or equipment? Metformin  1000 MG   Have you contacted your pharmacy to request a refill? yes   [facility] [place]   Patient notified that their request is being sent to the clinical staff for review and that they should receive a response within 2 business days.

## 2023-12-24 NOTE — Telephone Encounter (Signed)
 Aware refill sent to pharmacy ?

## 2023-12-29 ENCOUNTER — Telehealth: Payer: Self-pay

## 2023-12-29 ENCOUNTER — Other Ambulatory Visit (INDEPENDENT_AMBULATORY_CARE_PROVIDER_SITE_OTHER)

## 2023-12-29 ENCOUNTER — Encounter: Payer: Self-pay | Admitting: Pharmacist

## 2023-12-29 DIAGNOSIS — E119 Type 2 diabetes mellitus without complications: Secondary | ICD-10-CM

## 2023-12-29 NOTE — Telephone Encounter (Unsigned)
 Copied from CRM [id number]. Topic: Appointments - Scheduling Inquiry for Clinic >> Dec 29, 2023  4:16 PM Avram MATSU wrote: Reason for CRM: pt missed his appt with pharmacist and would ike to reschedule please advise [phone] (M)

## 2023-12-29 NOTE — Telephone Encounter (Signed)
 Copied from CRM [id number]. Topic: Appointments - Scheduling Inquiry for Clinic >> Dec 29, 2023  1:57 PM Cherylann RAMAN wrote: Reason for CRM: Patient had an appt today at 1pm with [facility] Pharmacist, however, patient never received a call. Patient can be contacted at [phone]. Patient would like to reschedule that appt due to having other engagements to attend to.

## 2023-12-29 NOTE — Progress Notes (Signed)
 12/29/2023 Name: Tyler Burnett MRN: [id number] DOB: 1955-06-24  Chief Complaint  Patient presents with   Diabetes    Tyler Burnett is a 68 y.o. year old male who presented for a telephone visit.   They were referred to the pharmacist by their PCP for assistance in managing diabetes, hypertension, and medication access.    Subjective:  Care Team: Primary Care Provider: Zollie Lowers, MD ;  Medication Access/Adherence  [facility]  [phone] [place] - [address] [place] [address] Phone: [phone] Fax: [phone]  [id number] - [place], [place] - [address] [facility] OF [address] [place] [address] Phone: [phone] Fax: [phone]  [facility] - [place], [place] - [address] [place] [address] Phone: [phone] Fax: [phone]  [facility] [address] [place] - [address] [place] [address] Phone: [phone] Fax: [phone]  Patient reports affordability concerns with their medications: Yes  Patient reports access/transportation concerns to their pharmacy: No  Patient reports adherence concerns with their medications:  Yes  only due to cost; patient very adherent when medications are available   Diabetes:  Current medications: metformin  1000 mg twice daily, Farxiga  10 mg daily, Rybelsus  7mg  daily Medications tried in the past: Rybelsus  (recently ran out ~2.5 weeks ago and was unable to afford ~$500 copay, was approved for Novo Nordisk PAP and awaiting shipment);  pioglitazone  30 mg daily   Patient reports adherence to medications. Despite metformin  last fill date in 2023 on epic fill hx, he confirmed he has 3 bottles of metformin  1000 mg and has been taking this twice daily. States he uses a pill box to organize his medications and denies any missed doses. Continues on Farxiga  via  [facility]/Az&me PAP. Pt's wife keeps a log of blood sugar readings, but he did not know where she keeps it. Says generally blood sugars have been ~100s, sometimes higher and he has noticed improvement since starting on Farxiga .   Had one episode of dizziness when he skipped a meal. Dizziness improved when he ate a meal. Patient denies hyperglycemic symptoms including polyuria, polydipsia, polyphagia, nocturia, neuropathy, blurred vision.   Current medication access support: AZ&Me PAP for Farxiga , Novo Nordisk PAP for Rybelsus      Objective:  Lab Results  Component Value Date   HGBA1C 7.3 (H) 10/07/2023    Lab Results  Component Value Date   CREATININE 1.13 10/07/2023   BUN 16 10/07/2023   NA 136 10/07/2023   K 4.7 10/07/2023   CL 98 10/07/2023   CO2 20 10/07/2023    Lab Results  Component Value Date   CHOL 125 10/07/2023   HDL 47 10/07/2023   LDLCALC 63 10/07/2023   TRIG 72 10/07/2023   CHOLHDL 2.7 10/07/2023    Medications Reviewed Today     Reviewed by Tyler Burnett, [facility] (Pharmacist) on 01/03/24 at 1213  Med List Status: <None>   Medication Order Taking? Sig Documenting Provider Last Dose Status Informant  amLODipine  (NORVASC ) 10 MG tablet [id number]  TAKE 1 TABLET BY MOUTH EVERY DAY FOR BLOOD PRESSURE Tyler Lowers, MD  Active   atorvastatin  (LIPITOR) 80 MG tablet [id number]  Take 1 tablet (80 mg total) by mouth daily. Tyler Lowers, MD  Active   benzonatate  (TESSALON  PERLES) 100 MG capsule [id number]  Take 1-2 capsules (100-200  mg total) by mouth 3 (three) times daily as needed. Tyler Annabella HERO, FNP  Active   Blood Glucose Monitoring Suppl DEVI [id number]  1 each by Does not apply route in the morning, at noon, and at bedtime. May substitute to any manufacturer covered by patient's insurance. Tyler Lowers, MD  Active   celecoxib  (CELEBREX ) 200 MG capsule [id number]  Take 1 capsule (200 mg total) by mouth daily. With food Tyler Lowers, MD  Active    clotrimazole -betamethasone  (LOTRISONE ) cream [id number]  Apply 1 Application topically 2 (two) times daily. Tyler Lowers, MD  Active   dapagliflozin  propanediol (FARXIGA ) 10 MG TABS tablet [id number]  Take 1 tablet (10 mg total) by mouth daily before breakfast. Tyler Lowers, MD  Active   lisinopril  (ZESTRIL ) 10 MG tablet [id number]  Take 1 tablet (10 mg total) by mouth daily. Tyler Lowers, MD  Active   metFORMIN  (GLUCOPHAGE ) 1000 MG tablet [id number]  Take 1 tablet (1,000 mg total) by mouth 2 (two) times daily with a meal. Tyler Lowers, MD  Active   polyethylene glycol powder (GLYCOLAX /MIRALAX ) 17 GM/SCOOP powder [id number]  Take 17 g by mouth daily. Increase to twice daily if neded for consistent daily BM Tyler Lowers, MD  Active   Semaglutide  (RYBELSUS ) 7 MG TABS [id number] Yes Take 1 tablet (7 mg total) by mouth daily. Tyler Lowers, MD  Active   tamsulosin  (FLOMAX ) 0.4 MG CAPS capsule [id number]  Take 2 capsules (0.8 mg total) by mouth at bedtime. For urine flow and prostate Tyler Lowers, MD  Active              Assessment/Plan:   Diabetes: - Currently uncontrolled, A1c improving. Recently ran out Rybelsus  while awaiting PAP shipment and was started on pioglitazone . Rybelsus  7 mg PAP shipment has arrived at our office for him to pick up. cy. We will restart Rybelsus  at 7 mg and stop pioglitazone . Patient plans to pick up today. Counseled on adverse effects and limiting portion sizes/stopping eating when satisfied to minimize GI adverse effects.  - Reviewed long term cardiovascular and renal outcomes of uncontrolled blood sugar - Reviewed goal A1c, goal fasting, and goal 2 hour post prandial glucose - Reviewed dietary modifications including emphasizing intake of protein and limiting intake of carb heavy foods - Continue Rybelsus  7 mg daily. Reviewed appropriate administration on an empty stomach first thing in the morning with water at least 30 minutes before other medications,  food, or drink.  - Recommend to continue metformin  1000 mg BID - Recommend to continue Farxiga  10 mg daily - Patient denies personal or family history of multiple endocrine neoplasia type 2, medullary thyroid cancer; personal history of pancreatitis or gallbladder disease. - Recommend to check FBG daily - Approved for Novo Nordisk PAP for Rybelsus  and AZ&Me PAP for Farxiga     Follow Up Plan:pharmD in January   Mliss Tarry Griffin, PharmD, BCACP, CPP Clinical Pharmacist, [facility]

## 2024-01-03 MED ORDER — RYBELSUS 7 MG PO TABS: 7.0000 mg | ORAL_TABLET | Freq: Every day | ORAL | Status: DC

## 2024-01-04 ENCOUNTER — Ambulatory Visit: Payer: Self-pay | Admitting: Family Medicine

## 2024-01-06 ENCOUNTER — Telehealth: Payer: Self-pay

## 2024-01-06 NOTE — Progress Notes (Signed)
 Complex Care Management Care Guide Note  01/06/2024 Name: Tyler Burnett MRN: [id number] DOB: [birth year]  Tyler Burnett is a 68 y.o. year old male who is a primary care patient of Stacks, Butler, MD and is actively engaged with the care management team. I reached out to [facility] by phone today to assist with re-scheduling  with the Pharmacist.  Follow up plan: Unsuccessful telephone outreach attempt made. A HIPAA compliant phone message was left for the patient providing contact information and requesting a return call.  Jeoffrey Buffalo , RMA     [facility]  [facility], [facility] Guide  Direct Dial: [phone]  Website: [email or web address]

## 2024-01-10 ENCOUNTER — Ambulatory Visit: Admitting: Family Medicine

## 2024-01-11 ENCOUNTER — Encounter: Payer: Self-pay | Admitting: Family Medicine

## 2024-01-12 ENCOUNTER — Other Ambulatory Visit: Payer: Self-pay | Admitting: *Deleted

## 2024-01-12 DIAGNOSIS — E782 Mixed hyperlipidemia: Secondary | ICD-10-CM

## 2024-01-20 ENCOUNTER — Ambulatory Visit (INDEPENDENT_AMBULATORY_CARE_PROVIDER_SITE_OTHER): Admitting: Family Medicine

## 2024-01-20 ENCOUNTER — Encounter (INDEPENDENT_AMBULATORY_CARE_PROVIDER_SITE_OTHER): Payer: Self-pay | Admitting: *Deleted

## 2024-01-20 ENCOUNTER — Encounter: Payer: Self-pay | Admitting: Family Medicine

## 2024-01-20 VITALS — BP 122/49 | HR 58 | Temp 98.0°F | Ht 72.0 in | Wt 259.0 lb

## 2024-01-20 DIAGNOSIS — E119 Type 2 diabetes mellitus without complications: Secondary | ICD-10-CM

## 2024-01-20 DIAGNOSIS — R351 Nocturia: Secondary | ICD-10-CM

## 2024-01-20 DIAGNOSIS — E782 Mixed hyperlipidemia: Secondary | ICD-10-CM | POA: Diagnosis not present

## 2024-01-20 DIAGNOSIS — I10 Essential (primary) hypertension: Secondary | ICD-10-CM

## 2024-01-20 DIAGNOSIS — Z7984 Long term (current) use of oral hypoglycemic drugs: Secondary | ICD-10-CM

## 2024-01-20 DIAGNOSIS — R079 Chest pain, unspecified: Secondary | ICD-10-CM

## 2024-01-20 DIAGNOSIS — N401 Enlarged prostate with lower urinary tract symptoms: Secondary | ICD-10-CM

## 2024-01-20 LAB — BAYER DCA HB A1C WAIVED: HB A1C (BAYER DCA - WAIVED): 7.7 % — ABNORMAL HIGH (ref 4.8–5.6)

## 2024-01-20 MED ORDER — METFORMIN HCL 1000 MG PO TABS: 1000.0000 mg | ORAL_TABLET | Freq: Two times a day (BID) | ORAL | 0 refills | Status: AC

## 2024-01-20 MED ORDER — AMLODIPINE BESYLATE 10 MG PO TABS: ORAL_TABLET | ORAL | 0 refills | Status: AC

## 2024-01-20 MED ORDER — LISINOPRIL 10 MG PO TABS: 10.0000 mg | ORAL_TABLET | Freq: Every day | ORAL | 0 refills | Status: AC

## 2024-01-20 MED ORDER — ALFUZOSIN HCL ER 10 MG PO TB24: 10.0000 mg | ORAL_TABLET | Freq: Every day | ORAL | 1 refills | Status: AC

## 2024-01-20 MED ORDER — RYBELSUS 14 MG PO TABS: 14.0000 mg | ORAL_TABLET | Freq: Every day | ORAL | 5 refills | Status: AC

## 2024-01-20 NOTE — Progress Notes (Signed)
 Subjective:  Patient ID: Tyler Burnett, male    DOB: 1955-05-03  Age: 68 y.o. MRN: [id number]  CC: Medical Management of Chronic Issues   HPI  Discussed the use of AI scribe software for clinical note transcription with the patient, who gave verbal consent to proceed.  History of Present Illness Tyler Burnett is a 68 year old male with diabetes who presents for a follow-up visit.  He feels generally well and notes that a previous infection has cleared up. He is currently taking Rybelsus , Farxiga , and metformin  for diabetes management. Additionally, he takes lisinopril , atorvastatin , and celecoxib . His blood sugar levels have been stable, and he is making dietary changes by avoiding sodas and sweet tea, and drinking about eight bottles of water daily. 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 He checks his blood sugar intermittently, with recent readings in the fives, although his wife believes they should be in the sevens.  He experienced pain on the right side of his chest about a week ago, which felt like indigestion and lasted for a couple of days.  He is taking tamsulosin  to aid urination but wakes up four times a night to urinate, affecting his sleep. He typically wakes at midnight, 2:30 AM, 4:30 AM, and around 6 AM, though occasionally sleeps through the night.  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 He mentions that some colleagues have faced issues with immigration enforcement, affecting their work environment.     presents for  follow-up of hypertension. Patient has no history of headache chest pain or shortness of breath or recent cough. Patient also denies symptoms of TIA such as focal numbness or weakness. Patient denies side effects from medication. States taking it regularly.   in for follow-up of elevated cholesterol. Doing well without complaints on  current medication. Denies side effects of statin including myalgia and arthralgia and nausea. Currently no chest pain, shortness of breath or other cardiovascular related symptoms noted.        11/10/2023    9:24 AM 10/18/2023    1:59 PM 10/07/2023    7:54 AM  Depression screen PHQ 2/9  Decreased Interest 0 0 0  Down, Depressed, Hopeless 0 0 0  PHQ - 2 Score 0 0 0  Altered sleeping 0    Tired, decreased energy 0    Change in appetite 0    Feeling bad or failure about yourself  0    Trouble concentrating 0    Moving slowly or fidgety/restless 0    Suicidal thoughts 0    PHQ-9 Score 0     Difficult doing work/chores Not difficult at all       Data saved with a previous flowsheet row definition    History Tyler Burnett has a past medical history of Diabetes mellitus without complication (HCC) and Hypertension.   He has a past surgical history that includes Fracture surgery and arm surgery (1980).   His family history includes Aneurysm in his mother; Heart disease in his father.He reports that he has never smoked. He has never used smokeless tobacco. He reports that he does not drink alcohol and does not use drugs.    ROS Review of Systems  Constitutional: Negative.   HENT: Negative.    Eyes:  Negative for visual disturbance.  Respiratory:  Negative for cough and shortness of breath.   Cardiovascular:  Negative  for chest pain and leg swelling.  Gastrointestinal:  Negative for abdominal pain, diarrhea, nausea and vomiting.  Genitourinary:  Negative for difficulty urinating.  Musculoskeletal:  Negative for arthralgias and myalgias.  Skin:  Negative for rash.  Neurological:  Negative for headaches.  Psychiatric/Behavioral:  Negative for sleep disturbance.     Objective:  BP (!) 122/49   Pulse (!) 58   Temp 98 F (36.7 C)   Ht 6' (1.829 m)   Wt 259 lb (117.5 kg)   SpO2 95%   BMI 35.13 kg/m   BP Readings from Last 3 Encounters:  01/20/24 (!) 122/49  11/10/23 (!) 115/59   10/18/23 136/70    Wt Readings from Last 3 Encounters:  01/20/24 259 lb (117.5 kg)  11/10/23 248 lb (112.5 kg)  10/18/23 251 lb 9.6 oz (114.1 kg)     Physical Exam Physical Exam GENERAL: Alert, cooperative, well developed, no acute distress. HEENT: Normocephalic, normal oropharynx, moist mucous membranes. CHEST: Clear to auscultation bilaterally, no wheezes, rhonchi, or crackles. CARDIOVASCULAR: Normal heart rate and rhythm, S1 and S2 normal without murmurs. ABDOMEN: Soft, non-tender, non-distended, without organomegaly, normal bowel sounds. EXTREMITIES: No cyanosis or edema. NEUROLOGICAL: Cranial nerves grossly intact, moves all extremities without gross motor or sensory deficit.   Assessment & Plan:  Type 2 diabetes mellitus without complication, without long-term current use of insulin (HCC) -     metFORMIN  HCl; Take 1 tablet (1,000 mg total) by mouth 2 (two) times daily with a meal.  Dispense: 180 tablet; Refill: 0 -     Bayer DCA Hb A1c Waived -     Comprehensive metabolic panel with GFR -     Microalbumin / creatinine urine ratio -     Rybelsus ; Take 1 tablet (14 mg total) by mouth daily.  Dispense: 30 tablet; Refill: 5  Essential hypertension, benign -     Lisinopril ; Take 1 tablet (10 mg total) by mouth daily.  Dispense: 90 tablet; Refill: 0 -     amLODIPine  Besylate; TAKE 1 TABLET BY MOUTH EVERY DAY FOR BLOOD PRESSURE  Dispense: 90 tablet; Refill: 0  Benign prostatic hyperplasia with nocturia -     Comprehensive metabolic panel with GFR -     Alfuzosin  HCl ER; Take 1 tablet (10 mg total) by mouth at bedtime.  Dispense: 90 tablet; Refill: 1  Mixed hyperlipidemia  Right-sided chest pain    Assessment and Plan Assessment & Plan Type 2 diabetes mellitus   A1c is 7.0, slightly above the target. Blood sugar levels are generally well-controlled with current medications and lifestyle modifications, including increased exercise and dietary changes. Continue Rybelsus ,  Farxiga , and metformin . Encourage dietary modifications and increased exercise to further improve glycemic control.  Benign prostatic hyperplasia with lower urinary tract symptoms   Experiencing nocturia, urinating approximately four times per night. Current treatment with tamsulosin  is not providing adequate relief. Discontinue tamsulosin  and initiate alfuzosin  at bedtime to manage urinary symptoms.  Essential hypertension   Blood pressure is well-controlled with current medication regimen. Continue lisinopril  for blood pressure management.  Mixed hyperlipidemia   Currently managed with atorvastatin  to protect cardiovascular health. Continue atorvastatin  for lipid management.  Chest pain, resolved   Intermittent chest pain described as pressure, lasting a couple of days, resolved spontaneously. No associated shortness of breath. Likely benign, but further evaluation may be needed if symptoms recur. Continue to monitor for recurrence of chest pain or associated symptoms such as shortness of breath.       Follow-up:  No follow-ups on file.  Butler Der, M.D.

## 2024-01-21 LAB — COMPREHENSIVE METABOLIC PANEL WITH GFR
ALT: 15 IU/L (ref 0–44)
AST: 14 IU/L (ref 0–40)
Albumin: 4.4 g/dL (ref 3.9–4.9)
Alkaline Phosphatase: 71 IU/L (ref 47–123)
BUN/Creatinine Ratio: 16 (ref 10–24)
BUN: 16 mg/dL (ref 8–27)
Bilirubin Total: 0.5 mg/dL (ref 0.0–1.2)
CO2: 25 mmol/L (ref 20–29)
Calcium: 9.4 mg/dL (ref 8.6–10.2)
Chloride: 101 mmol/L (ref 96–106)
Creatinine, Ser: 1.02 mg/dL (ref 0.76–1.27)
Globulin, Total: 2.5 g/dL (ref 1.5–4.5)
Glucose: 128 mg/dL — ABNORMAL HIGH (ref 70–99)
Potassium: 4.7 mmol/L (ref 3.5–5.2)
Sodium: 140 mmol/L (ref 134–144)
Total Protein: 6.9 g/dL (ref 6.0–8.5)
eGFR: 81 mL/min/1.73 (ref >59)

## 2024-01-21 LAB — MICROALBUMIN / CREATININE URINE RATIO
Creatinine, Urine: 64.1 mg/dL
Microalb/Creat Ratio: 10 mg/g{creat} (ref 0–29)
Microalbumin, Urine: 6.1 ug/mL

## 2024-01-23 ENCOUNTER — Ambulatory Visit: Payer: Self-pay | Admitting: Family Medicine

## 2024-01-23 NOTE — Progress Notes (Signed)
 Hello Tyler Burnett,  Your lab result is normal and/or stable.Some minor variations that are not significant are commonly marked abnormal, but do not represent any medical problem for you.  Best regards, Mechele Claude, M.D.

## 2024-02-21 ENCOUNTER — Encounter: Payer: Self-pay | Admitting: Family Medicine

## 2024-02-21 ENCOUNTER — Telehealth: Admitting: Family Medicine

## 2024-02-21 NOTE — Progress Notes (Signed)
 Attempted to connect via video but patient unsuccessful getting video/ audio to work and didn't want to take a chance that insurance wouldn't cover a phone visit.  Rescheduled with Oneil 12/24 at 8am for cough

## 2024-02-22 ENCOUNTER — Ambulatory Visit: Admitting: Family Medicine

## 2024-02-22 ENCOUNTER — Other Ambulatory Visit: Payer: Self-pay | Admitting: Family Medicine

## 2024-02-23 ENCOUNTER — Ambulatory Visit: Admitting: Family Medicine

## 2024-02-25 ENCOUNTER — Encounter: Payer: Self-pay | Admitting: Family

## 2024-02-25 ENCOUNTER — Ambulatory Visit (INDEPENDENT_AMBULATORY_CARE_PROVIDER_SITE_OTHER): Admitting: Family

## 2024-02-25 VITALS — BP 133/70 | HR 78 | Temp 98.0°F | Ht 72.0 in | Wt 247.4 lb

## 2024-02-25 DIAGNOSIS — J208 Acute bronchitis due to other specified organisms: Secondary | ICD-10-CM

## 2024-02-25 DIAGNOSIS — B9689 Other specified bacterial agents as the cause of diseases classified elsewhere: Secondary | ICD-10-CM | POA: Diagnosis not present

## 2024-02-25 MED ORDER — PROMETHAZINE-DM 6.25-15 MG/5ML PO SYRP: 5.0000 mL | ORAL_SOLUTION | Freq: Three times a day (TID) | ORAL | 0 refills | Status: AC | PRN

## 2024-02-25 MED ORDER — ALBUTEROL SULFATE HFA 108 (90 BASE) MCG/ACT IN AERS: 2.0000 | INHALATION_SPRAY | Freq: Four times a day (QID) | RESPIRATORY_TRACT | 0 refills | Status: AC | PRN

## 2024-02-25 MED ORDER — DOXYCYCLINE MONOHYDRATE 100 MG PO TABS: 100.0000 mg | ORAL_TABLET | Freq: Two times a day (BID) | ORAL | 0 refills | Status: AC

## 2024-02-25 MED ORDER — BENZONATATE 200 MG PO CAPS: 200.0000 mg | ORAL_CAPSULE | Freq: Two times a day (BID) | ORAL | 0 refills | Status: AC | PRN

## 2024-02-25 MED ORDER — PREDNISONE 20 MG PO TABS: 40.0000 mg | ORAL_TABLET | Freq: Every day | ORAL | 0 refills | Status: AC

## 2024-02-25 NOTE — Patient Instructions (Signed)

## 2024-02-25 NOTE — Progress Notes (Signed)
 "  Subjective:    Patient ID: Tyler Burnett, male    DOB: May 07, 1955, 68 y.o.   MRN: [id number]  Chief Complaint  Patient presents with   Cough   PT presents to the office today with cough that started two weeks ago.  Cough This is a new problem. The current episode started 1 to 4 weeks ago. The problem has been gradually worsening. The problem occurs every few minutes. The cough is Productive of purulent sputum. Associated symptoms include chills, headaches, myalgias, nasal congestion, postnasal drip, rhinorrhea, shortness of breath and wheezing. Pertinent negatives include no ear congestion, ear pain or fever. He has tried rest and OTC cough suppressant for the symptoms. The treatment provided mild relief.      Review of Systems  Constitutional:  Positive for chills. Negative for fever.  HENT:  Positive for postnasal drip and rhinorrhea. Negative for ear pain.   Respiratory:  Positive for cough, shortness of breath and wheezing.   Musculoskeletal:  Positive for myalgias.  Neurological:  Positive for headaches.  All other systems reviewed and are negative.   Social History   Socioeconomic History   Marital status: Married    Spouse name: Not on file   Number of children: Not on file   Years of education: Not on file   Highest education level: Not on file  Occupational History   Not on file  Tobacco Use   Smoking status: Never   Smokeless tobacco: Never  Vaping Use   Vaping status: Never Used  Substance and Sexual Activity   Alcohol use: No    Comment: occasion 2 drinks per year   Drug use: No   Sexual activity: Yes    Partners: Female  Other Topics Concern   Not on file  Social History Narrative   Not on file   Social Drivers of Health   Tobacco Use: Low Risk (02/21/2024)   Patient History    Smoking Tobacco Use: Never    Smokeless Tobacco Use: Never    Passive Exposure: Not on file  Financial Resource Strain: Low Risk (04/13/2023)   Overall Financial Resource  Strain (CARDIA)    Difficulty of Paying Living Expenses: Not hard at all  Food Insecurity: No Food Insecurity (04/13/2023)   Hunger Vital Sign    Worried About Running Out of Food in the Last Year: Never true    Ran Out of Food in the Last Year: Never true  Transportation Needs: No Transportation Needs (04/13/2023)   PRAPARE - Administrator, Civil Service (Medical): No    Lack of Transportation (Non-Medical): No  Physical Activity: Sufficiently Active (04/13/2023)   Exercise Vital Sign    Days of Exercise per Week: 2 days    Minutes of Exercise per Session: 120 min  Stress: No Stress Concern Present (04/13/2023)   [employer] of Occupational Health - Occupational Stress Questionnaire    Feeling of Stress : Not at all  Social Connections: Moderately Integrated (04/13/2023)   Social Connection and Isolation Panel    Frequency of Communication with Friends and Family: Once a week    Frequency of Social Gatherings with Friends and Family: Once a week    Attends Religious Services: 1 to 4 times per year    Active Member of Clubs or Organizations: Yes    Attends Banker Meetings: 1 to 4 times per year    Marital Status: Married  Depression (PHQ2-9): Low Risk (02/25/2024)   Depression (PHQ2-9)  PHQ-2 Score: 0  Alcohol Screen: Not on file  Housing: Low Risk (04/13/2023)   Housing Stability Vital Sign    Unable to Pay for Housing in the Last Year: No    Number of Times Moved in the Last Year: 1    Homeless in the Last Year: No  Utilities: Not At Risk (04/13/2023)   [employer] Utilities    Threatened with loss of utilities: No  Health Literacy: Adequate Health Literacy (04/13/2023)   B1300 Health Literacy    Frequency of need for help with medical instructions: Never   Family History  Problem Relation Age of Onset   Aneurysm Mother    Heart disease Father    Colon cancer Neg Hx    Rectal cancer Neg Hx    Stomach cancer Neg Hx         Objective:   Physical  Exam Vitals reviewed.  Constitutional:      General: He is not in acute distress.    Appearance: He is well-developed.  HENT:     Head: Normocephalic.     Right Ear: Tympanic membrane normal.     Left Ear: Tympanic membrane normal.  Eyes:     General:        Right eye: No discharge.        Left eye: No discharge.     Pupils: Pupils are equal, round, and reactive to light.  Neck:     Thyroid: No thyromegaly.  Cardiovascular:     Rate and Rhythm: Normal rate and regular rhythm.     Heart sounds: Normal heart sounds. No murmur heard. Pulmonary:     Effort: Pulmonary effort is normal. No respiratory distress.     Breath sounds: Rhonchi present. No wheezing.     Comments: Coarse nonproductive cough Abdominal:     General: Bowel sounds are normal. There is no distension.     Palpations: Abdomen is soft.     Tenderness: There is no abdominal tenderness.  Musculoskeletal:        General: No tenderness. Normal range of motion.     Cervical back: Normal range of motion and neck supple.  Skin:    General: Skin is warm and dry.     Findings: No erythema or rash.  Neurological:     Mental Status: He is alert and oriented to person, place, and time.     Cranial Nerves: No cranial nerve deficit.     Deep Tendon Reflexes: Reflexes are normal and symmetric.  Psychiatric:        Behavior: Behavior normal.        Thought Content: Thought content normal.        Judgment: Judgment normal.       BP 133/70   Pulse 78   Temp 98 F (36.7 C) (Temporal)   Ht 6' (1.829 m)   Wt 247 lb 6.4 oz (112.2 kg)   SpO2 95%   BMI 33.55 kg/m      Assessment & Plan:  Tyler Burnett comes in today with chief complaint of Cough   Diagnosis and orders addressed:  1. Acute bacterial bronchitis (Primary) - Take meds as prescribed - Use a cool mist humidifier  -Use saline nose sprays frequently -Force fluids -For any cough or congestion  Use plain Mucinex - regular strength or max strength is  fine -For fever or aces or pains- take tylenol or ibuprofen . -Throat lozenges if help -Follow up if symptoms worsen or do not improve  -  doxycycline  (ADOXA) 100 MG tablet; Take 1 tablet (100 mg total) by mouth 2 (two) times daily.  Dispense: 20 tablet; Refill: 0 - benzonatate  (TESSALON ) 200 MG capsule; Take 1 capsule (200 mg total) by mouth 2 (two) times daily as needed for cough.  Dispense: 20 capsule; Refill: 0 - promethazine -dextromethorphan (PROMETHAZINE -DM) 6.25-15 MG/5ML syrup; Take 5 mLs by mouth 3 (three) times daily as needed for cough.  Dispense: 118 mL; Refill: 0 - predniSONE  (DELTASONE ) 20 MG tablet; Take 2 tablets (40 mg total) by mouth daily with breakfast for 5 days. 2 po daily for 5 days  Dispense: 10 tablet; Refill: 0 - albuterol  (VENTOLIN  HFA) 108 (90 Base) MCG/ACT inhaler; Inhale 2 puffs into the lungs every 6 (six) hours as needed for wheezing or shortness of breath.  Dispense: 8 g; Refill: 0      Bari Learn, FNP   "

## 2024-04-04 ENCOUNTER — Ambulatory Visit: Admitting: Family Medicine

## 2024-04-04 ENCOUNTER — Encounter: Payer: Self-pay | Admitting: Family Medicine

## 2024-04-04 VITALS — BP 105/65 | HR 71 | Temp 97.5°F | Ht 72.0 in | Wt 236.0 lb

## 2024-04-04 DIAGNOSIS — N481 Balanitis: Secondary | ICD-10-CM

## 2024-04-04 DIAGNOSIS — E119 Type 2 diabetes mellitus without complications: Secondary | ICD-10-CM

## 2024-04-04 DIAGNOSIS — Z7984 Long term (current) use of oral hypoglycemic drugs: Secondary | ICD-10-CM

## 2024-04-04 MED ORDER — MUPIROCIN 2 % EX OINT: 1.0000 | TOPICAL_OINTMENT | Freq: Two times a day (BID) | CUTANEOUS | 0 refills | Status: AC

## 2024-04-04 MED ORDER — CLOTRIMAZOLE 1 % EX CREA: 1.0000 | TOPICAL_CREAM | Freq: Two times a day (BID) | CUTANEOUS | 1 refills | Status: AC

## 2024-04-04 MED ORDER — CEPHALEXIN 500 MG PO CAPS: 500.0000 mg | ORAL_CAPSULE | Freq: Four times a day (QID) | ORAL | 0 refills | Status: AC

## 2024-05-04 ENCOUNTER — Ambulatory Visit: Admitting: Family Medicine
